# Patient Record
Sex: Female | Born: 1960 | Race: White | Hispanic: No | Marital: Married | Smoking: Never smoker
Health system: Southern US, Community
[De-identification: ages and names within clinical notes are randomized; demographics above are authoritative.]

## PROBLEM LIST (undated history)

## (undated) DIAGNOSIS — J069 Acute upper respiratory infection, unspecified: Secondary | ICD-10-CM

## (undated) DIAGNOSIS — R0600 Dyspnea, unspecified: Secondary | ICD-10-CM

## (undated) DIAGNOSIS — J4550 Severe persistent asthma, uncomplicated: Secondary | ICD-10-CM

## (undated) DIAGNOSIS — N840 Polyp of corpus uteri: Secondary | ICD-10-CM

## (undated) DIAGNOSIS — E782 Mixed hyperlipidemia: Secondary | ICD-10-CM

## (undated) DIAGNOSIS — D509 Iron deficiency anemia, unspecified: Secondary | ICD-10-CM

## (undated) DIAGNOSIS — J45909 Unspecified asthma, uncomplicated: Secondary | ICD-10-CM

## (undated) DIAGNOSIS — M2550 Pain in unspecified joint: Secondary | ICD-10-CM

## (undated) DIAGNOSIS — R945 Abnormal results of liver function studies: Secondary | ICD-10-CM

## (undated) DIAGNOSIS — J189 Pneumonia, unspecified organism: Secondary | ICD-10-CM

## (undated) DIAGNOSIS — R9389 Abnormal findings on diagnostic imaging of other specified body structures: Secondary | ICD-10-CM

## (undated) DIAGNOSIS — G4730 Sleep apnea, unspecified: Secondary | ICD-10-CM

## (undated) DIAGNOSIS — Z973 Presence of spectacles and contact lenses: Secondary | ICD-10-CM

## (undated) DIAGNOSIS — E739 Lactose intolerance, unspecified: Secondary | ICD-10-CM

## (undated) DIAGNOSIS — H532 Diplopia: Secondary | ICD-10-CM

## (undated) DIAGNOSIS — F329 Major depressive disorder, single episode, unspecified: Secondary | ICD-10-CM

## (undated) DIAGNOSIS — E559 Vitamin D deficiency, unspecified: Secondary | ICD-10-CM

## (undated) DIAGNOSIS — G4733 Obstructive sleep apnea (adult) (pediatric): Secondary | ICD-10-CM

## (undated) DIAGNOSIS — E539 Vitamin B deficiency, unspecified: Secondary | ICD-10-CM

## (undated) DIAGNOSIS — E538 Deficiency of other specified B group vitamins: Secondary | ICD-10-CM

## (undated) DIAGNOSIS — G7000 Myasthenia gravis without (acute) exacerbation: Secondary | ICD-10-CM

## (undated) DIAGNOSIS — M4800 Spinal stenosis, site unspecified: Secondary | ICD-10-CM

## (undated) DIAGNOSIS — K449 Diaphragmatic hernia without obstruction or gangrene: Secondary | ICD-10-CM

## (undated) DIAGNOSIS — R112 Nausea with vomiting, unspecified: Secondary | ICD-10-CM

## (undated) DIAGNOSIS — M48061 Spinal stenosis, lumbar region without neurogenic claudication: Secondary | ICD-10-CM

## (undated) DIAGNOSIS — I890 Lymphedema, not elsewhere classified: Secondary | ICD-10-CM

## (undated) DIAGNOSIS — K589 Irritable bowel syndrome without diarrhea: Secondary | ICD-10-CM

## (undated) DIAGNOSIS — C439 Malignant melanoma of skin, unspecified: Secondary | ICD-10-CM

## (undated) DIAGNOSIS — Z8719 Personal history of other diseases of the digestive system: Secondary | ICD-10-CM

## (undated) DIAGNOSIS — F32A Depression, unspecified: Secondary | ICD-10-CM

## (undated) DIAGNOSIS — K760 Fatty (change of) liver, not elsewhere classified: Secondary | ICD-10-CM

## (undated) DIAGNOSIS — Z87442 Personal history of urinary calculi: Secondary | ICD-10-CM

## (undated) DIAGNOSIS — R600 Localized edema: Secondary | ICD-10-CM

## (undated) DIAGNOSIS — M1990 Unspecified osteoarthritis, unspecified site: Secondary | ICD-10-CM

## (undated) DIAGNOSIS — K219 Gastro-esophageal reflux disease without esophagitis: Secondary | ICD-10-CM

## (undated) DIAGNOSIS — D649 Anemia, unspecified: Secondary | ICD-10-CM

## (undated) HISTORY — PX: SALPINGOOPHORECTOMY: SHX82

## (undated) HISTORY — DX: Abnormal results of liver function studies: R94.5

## (undated) HISTORY — DX: Dyspnea, unspecified: R06.00

## (undated) HISTORY — DX: Pain in unspecified joint: M25.50

## (undated) HISTORY — DX: Fatty (change of) liver, not elsewhere classified: K76.0

## (undated) HISTORY — DX: Depression, unspecified: F32.A

## (undated) HISTORY — DX: Lactose intolerance, unspecified: E73.9

## (undated) HISTORY — DX: Irritable bowel syndrome without diarrhea: K58.9

## (undated) HISTORY — PX: WRIST SURGERY: SHX841

## (undated) HISTORY — DX: Unspecified osteoarthritis, unspecified site: M19.90

## (undated) HISTORY — DX: Vitamin D deficiency, unspecified: E55.9

## (undated) HISTORY — DX: Iron deficiency anemia, unspecified: D50.9

## (undated) HISTORY — DX: Acute upper respiratory infection, unspecified: J06.9

## (undated) HISTORY — DX: Localized edema: R60.0

## (undated) HISTORY — DX: Anemia, unspecified: D64.9

## (undated) HISTORY — DX: Vitamin B deficiency, unspecified: E53.9

## (undated) HISTORY — DX: Major depressive disorder, single episode, unspecified: F32.9

## (undated) HISTORY — DX: Spinal stenosis, site unspecified: M48.00

## (undated) HISTORY — PX: APPENDECTOMY: SHX54

## (undated) HISTORY — PX: OOPHORECTOMY: SHX86

## (undated) HISTORY — DX: Malignant melanoma of skin, unspecified: C43.9

## (undated) HISTORY — PX: TOTAL KNEE ARTHROPLASTY: SHX125

## (undated) HISTORY — PX: BACK SURGERY: SHX140

## (undated) HISTORY — PX: MELANOMA EXCISION: SHX5266

---

## 1981-02-28 HISTORY — PX: APPENDECTOMY: SHX54

## 1984-02-29 HISTORY — PX: ORIF WRIST FRACTURE: SHX2133

## 1997-02-28 HISTORY — PX: EYE SURGERY: SHX253

## 1998-02-28 DIAGNOSIS — G7000 Myasthenia gravis without (acute) exacerbation: Secondary | ICD-10-CM

## 1998-02-28 HISTORY — DX: Myasthenia gravis without (acute) exacerbation: G70.00

## 1998-04-21 ENCOUNTER — Encounter: Admission: RE | Admit: 1998-04-21 | Discharge: 1998-07-20 | Payer: Self-pay

## 1998-05-30 HISTORY — PX: KNEE ARTHROSCOPY: SUR90

## 1998-09-22 ENCOUNTER — Encounter: Payer: Self-pay | Admitting: Gastroenterology

## 1998-09-22 ENCOUNTER — Ambulatory Visit (HOSPITAL_COMMUNITY): Admission: RE | Admit: 1998-09-22 | Discharge: 1998-09-22 | Payer: Self-pay | Admitting: Gastroenterology

## 1998-10-27 ENCOUNTER — Ambulatory Visit (HOSPITAL_BASED_OUTPATIENT_CLINIC_OR_DEPARTMENT_OTHER): Admission: RE | Admit: 1998-10-27 | Discharge: 1998-10-27 | Payer: Self-pay | Admitting: Orthopaedic Surgery

## 1998-11-03 ENCOUNTER — Ambulatory Visit (HOSPITAL_COMMUNITY): Admission: RE | Admit: 1998-11-03 | Discharge: 1998-11-03 | Payer: Self-pay | Admitting: Obstetrics and Gynecology

## 1999-02-19 ENCOUNTER — Other Ambulatory Visit: Admission: RE | Admit: 1999-02-19 | Discharge: 1999-02-19 | Payer: Self-pay | Admitting: Gastroenterology

## 1999-03-01 HISTORY — PX: THYMECTOMY: SHX6121

## 1999-03-01 HISTORY — PX: BREAST REDUCTION SURGERY: SHX8

## 1999-05-21 ENCOUNTER — Ambulatory Visit (HOSPITAL_COMMUNITY): Admission: RE | Admit: 1999-05-21 | Discharge: 1999-05-21 | Payer: Self-pay | Admitting: Gastroenterology

## 1999-05-21 ENCOUNTER — Encounter: Payer: Self-pay | Admitting: Gastroenterology

## 2000-03-14 ENCOUNTER — Other Ambulatory Visit: Admission: RE | Admit: 2000-03-14 | Discharge: 2000-03-14 | Payer: Self-pay | Admitting: Obstetrics and Gynecology

## 2000-05-16 ENCOUNTER — Ambulatory Visit (HOSPITAL_BASED_OUTPATIENT_CLINIC_OR_DEPARTMENT_OTHER): Admission: RE | Admit: 2000-05-16 | Discharge: 2000-05-17 | Payer: Self-pay | Admitting: Orthopaedic Surgery

## 2000-05-16 ENCOUNTER — Encounter: Payer: Self-pay | Admitting: Orthopaedic Surgery

## 2000-05-16 ENCOUNTER — Encounter: Admission: RE | Admit: 2000-05-16 | Discharge: 2000-05-16 | Payer: Self-pay | Admitting: Orthopaedic Surgery

## 2000-06-30 ENCOUNTER — Encounter: Payer: Self-pay | Admitting: Emergency Medicine

## 2000-06-30 ENCOUNTER — Emergency Department (HOSPITAL_COMMUNITY): Admission: EM | Admit: 2000-06-30 | Discharge: 2000-06-30 | Payer: Self-pay | Admitting: Emergency Medicine

## 2000-07-03 ENCOUNTER — Ambulatory Visit (HOSPITAL_COMMUNITY): Admission: RE | Admit: 2000-07-03 | Discharge: 2000-07-03 | Payer: Self-pay | Admitting: Emergency Medicine

## 2000-07-03 ENCOUNTER — Encounter: Payer: Self-pay | Admitting: Emergency Medicine

## 2000-11-11 ENCOUNTER — Encounter: Payer: Self-pay | Admitting: Emergency Medicine

## 2000-11-11 ENCOUNTER — Emergency Department (HOSPITAL_COMMUNITY): Admission: EM | Admit: 2000-11-11 | Discharge: 2000-11-11 | Payer: Self-pay | Admitting: Emergency Medicine

## 2001-03-13 ENCOUNTER — Encounter: Admission: RE | Admit: 2001-03-13 | Discharge: 2001-03-13 | Payer: Self-pay | Admitting: Internal Medicine

## 2001-03-13 ENCOUNTER — Encounter: Payer: Self-pay | Admitting: Internal Medicine

## 2001-07-03 ENCOUNTER — Emergency Department (HOSPITAL_COMMUNITY): Admission: AC | Admit: 2001-07-03 | Discharge: 2001-07-03 | Payer: Self-pay

## 2001-07-03 ENCOUNTER — Encounter: Payer: Self-pay | Admitting: Emergency Medicine

## 2001-07-27 ENCOUNTER — Other Ambulatory Visit: Admission: RE | Admit: 2001-07-27 | Discharge: 2001-07-27 | Payer: Self-pay | Admitting: Obstetrics and Gynecology

## 2001-10-03 ENCOUNTER — Inpatient Hospital Stay (HOSPITAL_COMMUNITY): Admission: AD | Admit: 2001-10-03 | Discharge: 2001-10-03 | Payer: Self-pay | Admitting: Obstetrics and Gynecology

## 2001-10-31 ENCOUNTER — Inpatient Hospital Stay (HOSPITAL_COMMUNITY): Admission: AD | Admit: 2001-10-31 | Discharge: 2001-10-31 | Payer: Self-pay | Admitting: Obstetrics and Gynecology

## 2001-11-26 ENCOUNTER — Ambulatory Visit (HOSPITAL_BASED_OUTPATIENT_CLINIC_OR_DEPARTMENT_OTHER): Admission: RE | Admit: 2001-11-26 | Discharge: 2001-11-26 | Payer: Self-pay | Admitting: Plastic Surgery

## 2001-11-26 ENCOUNTER — Encounter (INDEPENDENT_AMBULATORY_CARE_PROVIDER_SITE_OTHER): Payer: Self-pay | Admitting: Specialist

## 2002-01-17 ENCOUNTER — Encounter (INDEPENDENT_AMBULATORY_CARE_PROVIDER_SITE_OTHER): Payer: Self-pay | Admitting: *Deleted

## 2002-01-17 ENCOUNTER — Ambulatory Visit (HOSPITAL_COMMUNITY): Admission: RE | Admit: 2002-01-17 | Discharge: 2002-01-17 | Payer: Self-pay | Admitting: Obstetrics and Gynecology

## 2002-01-29 ENCOUNTER — Encounter: Admission: RE | Admit: 2002-01-29 | Discharge: 2002-03-01 | Payer: Self-pay | Admitting: Family Medicine

## 2002-02-28 HISTORY — PX: BREAST SURGERY: SHX581

## 2002-02-28 HISTORY — PX: BREAST REDUCTION SURGERY: SHX8

## 2002-02-28 HISTORY — PX: THYMECTOMY: SHX6121

## 2002-12-27 ENCOUNTER — Other Ambulatory Visit: Admission: RE | Admit: 2002-12-27 | Discharge: 2002-12-27 | Payer: Self-pay | Admitting: Obstetrics and Gynecology

## 2003-04-03 ENCOUNTER — Encounter: Admission: RE | Admit: 2003-04-03 | Discharge: 2003-04-03 | Payer: Self-pay | Admitting: Obstetrics and Gynecology

## 2003-08-04 ENCOUNTER — Emergency Department (HOSPITAL_COMMUNITY): Admission: EM | Admit: 2003-08-04 | Discharge: 2003-08-04 | Payer: Self-pay | Admitting: Emergency Medicine

## 2003-10-31 ENCOUNTER — Encounter: Payer: Self-pay | Admitting: Gastroenterology

## 2004-01-07 ENCOUNTER — Other Ambulatory Visit: Admission: RE | Admit: 2004-01-07 | Discharge: 2004-01-07 | Payer: Self-pay | Admitting: Obstetrics and Gynecology

## 2004-05-13 ENCOUNTER — Encounter (HOSPITAL_COMMUNITY): Admission: RE | Admit: 2004-05-13 | Discharge: 2004-08-11 | Payer: Self-pay | Admitting: *Deleted

## 2004-08-11 ENCOUNTER — Ambulatory Visit (HOSPITAL_COMMUNITY): Admission: RE | Admit: 2004-08-11 | Discharge: 2004-08-11 | Payer: Self-pay | Admitting: Plastic Surgery

## 2004-08-11 ENCOUNTER — Encounter (INDEPENDENT_AMBULATORY_CARE_PROVIDER_SITE_OTHER): Payer: Self-pay | Admitting: Plastic Surgery

## 2004-08-11 ENCOUNTER — Ambulatory Visit (HOSPITAL_BASED_OUTPATIENT_CLINIC_OR_DEPARTMENT_OTHER): Admission: RE | Admit: 2004-08-11 | Discharge: 2004-08-11 | Payer: Self-pay | Admitting: Plastic Surgery

## 2005-02-15 ENCOUNTER — Ambulatory Visit (HOSPITAL_BASED_OUTPATIENT_CLINIC_OR_DEPARTMENT_OTHER): Admission: RE | Admit: 2005-02-15 | Discharge: 2005-02-15 | Payer: Self-pay | Admitting: Orthopaedic Surgery

## 2005-02-15 ENCOUNTER — Ambulatory Visit (HOSPITAL_COMMUNITY): Admission: RE | Admit: 2005-02-15 | Discharge: 2005-02-15 | Payer: Self-pay | Admitting: Orthopaedic Surgery

## 2005-04-13 ENCOUNTER — Other Ambulatory Visit: Admission: RE | Admit: 2005-04-13 | Discharge: 2005-04-13 | Payer: Self-pay | Admitting: Obstetrics and Gynecology

## 2005-04-27 ENCOUNTER — Encounter: Admission: RE | Admit: 2005-04-27 | Discharge: 2005-04-27 | Payer: Self-pay | Admitting: Obstetrics and Gynecology

## 2006-07-25 ENCOUNTER — Inpatient Hospital Stay (HOSPITAL_COMMUNITY): Admission: EM | Admit: 2006-07-25 | Discharge: 2006-07-30 | Payer: Self-pay | Admitting: Emergency Medicine

## 2006-07-27 ENCOUNTER — Encounter (INDEPENDENT_AMBULATORY_CARE_PROVIDER_SITE_OTHER): Payer: Self-pay | Admitting: Gastroenterology

## 2006-08-17 ENCOUNTER — Emergency Department (HOSPITAL_COMMUNITY): Admission: EM | Admit: 2006-08-17 | Discharge: 2006-08-17 | Payer: Self-pay | Admitting: Family Medicine

## 2006-10-17 ENCOUNTER — Ambulatory Visit: Payer: Self-pay | Admitting: Gastroenterology

## 2006-12-29 ENCOUNTER — Encounter (HOSPITAL_COMMUNITY): Admission: RE | Admit: 2006-12-29 | Discharge: 2007-02-28 | Payer: Self-pay | Admitting: Neurology

## 2007-04-13 ENCOUNTER — Encounter (HOSPITAL_COMMUNITY): Admission: RE | Admit: 2007-04-13 | Discharge: 2007-07-12 | Payer: Self-pay | Admitting: Neurology

## 2007-06-04 DIAGNOSIS — G7000 Myasthenia gravis without (acute) exacerbation: Secondary | ICD-10-CM | POA: Insufficient documentation

## 2007-06-04 DIAGNOSIS — G2581 Restless legs syndrome: Secondary | ICD-10-CM | POA: Insufficient documentation

## 2007-06-04 DIAGNOSIS — K589 Irritable bowel syndrome without diarrhea: Secondary | ICD-10-CM | POA: Insufficient documentation

## 2007-06-04 DIAGNOSIS — E538 Deficiency of other specified B group vitamins: Secondary | ICD-10-CM | POA: Insufficient documentation

## 2007-06-04 DIAGNOSIS — K219 Gastro-esophageal reflux disease without esophagitis: Secondary | ICD-10-CM | POA: Insufficient documentation

## 2007-07-16 ENCOUNTER — Emergency Department (HOSPITAL_COMMUNITY): Admission: EM | Admit: 2007-07-16 | Discharge: 2007-07-16 | Payer: Self-pay | Admitting: Emergency Medicine

## 2008-03-25 ENCOUNTER — Ambulatory Visit: Payer: Self-pay | Admitting: Family Medicine

## 2008-03-25 DIAGNOSIS — R5383 Other fatigue: Secondary | ICD-10-CM | POA: Insufficient documentation

## 2008-03-25 DIAGNOSIS — J3089 Other allergic rhinitis: Secondary | ICD-10-CM | POA: Insufficient documentation

## 2008-03-25 DIAGNOSIS — J45909 Unspecified asthma, uncomplicated: Secondary | ICD-10-CM | POA: Insufficient documentation

## 2008-03-25 DIAGNOSIS — R5381 Other malaise: Secondary | ICD-10-CM | POA: Insufficient documentation

## 2008-03-25 DIAGNOSIS — D649 Anemia, unspecified: Secondary | ICD-10-CM | POA: Insufficient documentation

## 2008-03-27 LAB — CONVERTED CEMR LAB
ALT: 15 units/L (ref 0–35)
Albumin: 4 g/dL (ref 3.5–5.2)
Alkaline Phosphatase: 66 units/L (ref 39–117)
BUN: 11 mg/dL (ref 6–23)
Bilirubin, Direct: 0.1 mg/dL (ref 0.0–0.3)
CO2: 29 meq/L (ref 19–32)
Calcium: 9.2 mg/dL (ref 8.4–10.5)
Eosinophils Relative: 1.6 % (ref 0.0–5.0)
GFR calc Af Amer: 115 mL/min
Glucose, Bld: 95 mg/dL (ref 70–99)
HCT: 37.5 % (ref 36.0–46.0)
Hemoglobin: 13.1 g/dL (ref 12.0–15.0)
LDL Cholesterol: 111 mg/dL — ABNORMAL HIGH (ref 0–99)
Lymphocytes Relative: 26.7 % (ref 12.0–46.0)
Monocytes Absolute: 0.5 10*3/uL (ref 0.1–1.0)
Monocytes Relative: 7.6 % (ref 3.0–12.0)
Neutro Abs: 4.4 10*3/uL (ref 1.4–7.7)
Potassium: 4.1 meq/L (ref 3.5–5.1)
RDW: 11.9 % (ref 11.5–14.6)
Sodium: 140 meq/L (ref 135–145)
Total CHOL/HDL Ratio: 3.5
Total Protein: 7.4 g/dL (ref 6.0–8.3)
WBC: 6.8 10*3/uL (ref 4.5–10.5)

## 2008-03-31 ENCOUNTER — Telehealth: Payer: Self-pay | Admitting: Family Medicine

## 2008-04-03 ENCOUNTER — Encounter: Payer: Self-pay | Admitting: Family Medicine

## 2008-04-03 ENCOUNTER — Other Ambulatory Visit: Admission: RE | Admit: 2008-04-03 | Discharge: 2008-04-03 | Payer: Self-pay | Admitting: Family Medicine

## 2008-04-03 ENCOUNTER — Ambulatory Visit: Payer: Self-pay | Admitting: Family Medicine

## 2008-04-07 ENCOUNTER — Encounter (INDEPENDENT_AMBULATORY_CARE_PROVIDER_SITE_OTHER): Payer: Self-pay | Admitting: *Deleted

## 2008-04-10 ENCOUNTER — Encounter (INDEPENDENT_AMBULATORY_CARE_PROVIDER_SITE_OTHER): Payer: Self-pay | Admitting: *Deleted

## 2008-05-16 ENCOUNTER — Ambulatory Visit: Payer: Self-pay | Admitting: Cardiology

## 2008-05-16 ENCOUNTER — Ambulatory Visit: Payer: Self-pay | Admitting: Family Medicine

## 2008-05-16 DIAGNOSIS — R1084 Generalized abdominal pain: Secondary | ICD-10-CM | POA: Insufficient documentation

## 2008-05-16 LAB — CONVERTED CEMR LAB
AST: 20 units/L (ref 0–37)
Albumin: 3.8 g/dL (ref 3.5–5.2)
Alkaline Phosphatase: 72 units/L (ref 39–117)
BUN: 14 mg/dL (ref 6–23)
Basophils Absolute: 0 10*3/uL (ref 0.0–0.1)
Bilirubin, Direct: 0 mg/dL (ref 0.0–0.3)
CO2: 29 meq/L (ref 19–32)
Calcium: 9.3 mg/dL (ref 8.4–10.5)
Creatinine, Ser: 0.7 mg/dL (ref 0.4–1.2)
Eosinophils Absolute: 0.1 10*3/uL (ref 0.0–0.7)
GFR calc non Af Amer: 95.06 mL/min (ref >60)
Glucose, Bld: 95 mg/dL (ref 70–99)
Lymphocytes Relative: 21.6 % (ref 12.0–46.0)
MCHC: 35 g/dL (ref 30.0–36.0)
Monocytes Relative: 8.4 % (ref 3.0–12.0)
Neutro Abs: 4.5 10*3/uL (ref 1.4–7.7)
Neutrophils Relative %: 67.6 % (ref 43.0–77.0)
Platelets: 273 10*3/uL (ref 150.0–400.0)
RDW: 12.2 % (ref 11.5–14.6)
Sodium: 141 meq/L (ref 135–145)
Total Bilirubin: 0.7 mg/dL (ref 0.3–1.2)

## 2008-05-23 ENCOUNTER — Encounter (INDEPENDENT_AMBULATORY_CARE_PROVIDER_SITE_OTHER): Payer: Self-pay | Admitting: *Deleted

## 2008-05-23 LAB — CONVERTED CEMR LAB: GC Probe Amp, Genital: NEGATIVE

## 2008-06-04 ENCOUNTER — Ambulatory Visit: Payer: Self-pay | Admitting: Family Medicine

## 2008-06-04 DIAGNOSIS — M5412 Radiculopathy, cervical region: Secondary | ICD-10-CM | POA: Insufficient documentation

## 2008-06-04 DIAGNOSIS — M7700 Medial epicondylitis, unspecified elbow: Secondary | ICD-10-CM | POA: Insufficient documentation

## 2008-06-23 ENCOUNTER — Telehealth: Payer: Self-pay | Admitting: Family Medicine

## 2010-03-21 ENCOUNTER — Encounter: Payer: Self-pay | Admitting: Family Medicine

## 2010-03-22 ENCOUNTER — Encounter: Payer: Self-pay | Admitting: Family Medicine

## 2010-07-13 NOTE — Assessment & Plan Note (Signed)
[facility]                         GASTROENTEROLOGY OFFICE NOTE   [name], [name]                 MRN:          [id number]  DATE:10/17/2006                            DOB:          23-Apr-1960    [name] REQUESTING CONSULTATION:  Celso Amy, PA.   REASON FOR REFERRAL:  Abdominal pain and diarrhea.   HISTORY OF PRESENT ILLNESS:  [name] is a very nice 50 year old  white female who I know from [facility].  She has  previously been evaluated by Dr. Sheryn Bison.  See his note dated  October 06, 2003.  She underwent colonoscopy in September of 2005, and the  colonoscopy was normal.  Random biopsies were also normal.  IBD  serologies were negative, and she was presumed to have diarrhea-  predominant irritable bowel syndrome.  She states she has had on and  off, mild, loose, non-bloody stools over the years.  On Jul 25, 2006 she  had the acute onset of abdominal pain associated with bloody diarrhea,  and was hospitalized at [facility] for 6 days.  The records are  enclosed on the chart, and I have reviewed them including history and  physical exam, discharge summary, sigmoidoscopy with biopsies, and  imaging studies.  A CT angiogram showed no vascular abnormalities.  Her  sigmoidoscopy revealed descending colon colitis with biopsies showing  mucosal erosions with active colitis and reactive changes.  The  pathologist felt the changes favored ischemic colitis.  Her history and  physical exam notes a history of Crohn's disease; however, it does not  appear that she has had a diagnosis of Crohn's disease established.  After seeing Dr. Sheryn Bison, she was followed by Dr. Vida Rigger  over the past few years, and I do not have those records available for  me at the time of dictation.  A CT angiography did not show any large  vessel disease.  The patient notes that she had either an anal fissure  or anal fistula in  2000 that healed but did lead to some concerns  regarding Crohn's disease.  Since her hospitalization, she has continued  to have mild, right lower quadrant greater than left lower quadrant  pain, associated with gas and mild diarrhea.  She has noted a very small  amount of rectal bleeding on 1 or 2 occasions.  She has several family  members with colon cancer, including 2 maternal aunts, 1 paternal aunt.  No 1st degree relatives with colon cancer.  No relatives with ulcerative  colitis or Crohn's disease.   PAST MEDICAL HISTORY:  1. Myasthenia gravis, diagnosed in 1999.  2. Status post thymectomy in 2000.  3. Restless leg syndrome.  4. B12 deficiency.  5. Status post left oophorectomy.  6. Status post right elbow surgery.  7. Gastroesophageal reflux disease.  8. Diarrhea-predominant irritable bowel syndrome.  9. History of a corneal erosion.   CURRENT MEDICATIONS:  Listed on the chart, updated and reviewed.   MEDICATION ALLERGIES:  None known.   SOCIAL HISTORY/REVIEW OF SYSTEMS:  Per the handwritten form.   PHYSICAL EXAMINATION:  Overweight white female in no  acute distress.  Height 5 feet 7-1/2 inches, weight 213 pounds, blood pressure is 122/80,  pulse 88 and regular.  HEENT EXAM:  Anicteric sclerae, oropharynx clear.  CHEST:  Clear to auscultation bilaterally.  CARDIAC:  Regular rate and rhythm without murmurs appreciated.  ABDOMEN:  Soft with minimal right lower quadrant tenderness to deep  palpation, no rebound or guarding, no palpable organomegaly, masses, or  hernias.  Normoactive bowel sounds.  RECTAL EXAMINATION:  Deferred to time of colonoscopy.  NEUROLOGIC:  Alert and oriented x3.  Grossly nonfocal.   ASSESSMENT AND PLAN:  Mild diarrhea with small volume hematochezia and  mild right lower quadrant pain.  Recent hospitalization for presumed  ischemic colitis, etiology unclear.  Very questionable history of  Crohn's disease, with prior colonoscopy and IBD serologies  in 2005 were  entirely negative.  Recent imaging studies and Flex Sig did not show any  evidence of IBD. Schedule colonoscopy with attempted evaluation of the  terminal ileum and biopsies as indicated.  Risks, benefits, and  alternatives to colonoscopy, possible biopsy, and possible polypectomy  discussed with the patient and she consents to proceed.  This will be  scheduled electively.  Will begin Symax 1-2 sublingually q.4 h. p.r.n.  She may have a post inflammatory flare of diarrhea-predominant irritable  bowel syndrome.  Consider capsule endoscopy or CT enterography for  further evaluation of the small intestine.  Consider repeating IBD  serologies.     Venita Lick. Russella Dar, MD, [facility]  Electronically Signed    MTS/MedQ  DD: 10/17/2006  DT: 10/18/2006  Job #: [id number]   cc:   Celso Amy, [facility]

## 2010-07-13 NOTE — Consult Note (Signed)
NAMEMCCALL, WILL          ACCOUNT NO.:  [id number]   MEDICAL RECORD NO.:  [id number]          PATIENT TYPE:  INP   LOCATION:  3706                         FACILITY:  [facility]   PHYSICIAN:  Shirley Friar, MDDATE OF BIRTH:  02-15-1961   DATE OF CONSULTATION:  07/26/2006  DATE OF DISCHARGE:                                 CONSULTATION   REASON FOR CONSULTATION:  We were consulted this morning for bloody  diarrhea, possible small bowel obstruction.   HISTORY OF PRESENT ILLNESS:  This is a 50 year old female who awoke  suddenly early Tuesday morning with vomiting and black diarrhea.  The  vomiting has continued and is now dry heaves.  The black diarrhea has  changed to red blood.  The patient states that she had eight to 10 bowel  movements overnight that consisted of straight red blood.  She has  severe lower abdominal pain bilaterally.  No recent illness or sick  contacts.  She has positive acid reflux.  She has a recently diagnosed  vitamin B12 deficiency with positive antiparietal antibodies.  On EGD on  Jul 05, 2006 she had multiple gastric polyps, a hiatal hernia and a  benign-appearing stricture.  The EGD was otherwise normal.  Pathology of  the polyps was benign.  Negative for H pylori.  She states that her last  oral intake was on Monday of this week and she has had no previous  history of bloody diarrhea.  However, she does have diarrhea two to  three times per week normally and has for years.  She states that during  this period she will have five to six bowel movements a day and then it  subsides on its own.   PAST MEDICAL HISTORY:  1. Significant for a colonoscopy in 1998 by Dr. Vida Rigger that showed      proctitis, questionable irritable bowel disease.  2. She has a history of myasthenia gravis.  3. Restless leg syndrome.  4. Gastroesophageal reflux disease.   SURGERIES:  1. Thymectomy.  2. Right knee surgery.  3. Open laparoscopy for fibroids.  4.  Oophorectomy.  5. She has had a hysterectomy.   PRIMARY MEDICAL CARE Shriya Aker:  Celso Amy, physician assistant.   CURRENT MEDICATIONS AT HOME:  1. Mirapex.  2. Protonix.   ALLERGIES:  She has no known drug allergies.   REVIEW OF SYSTEMS:  Significant for no weight loss or recent illness.   FAMILY HISTORY:  She has a family history of her mother and sister who  have both had small bowel obstructions and her father who has a history  of ulcers.   SOCIAL HISTORY:  Positive for occasional alcohol.  Negative tobacco.  Negative drugs.  She lives with her husband.   PHYSICAL EXAMINATION:  GENERAL APPEARANCE:  She is alert and oriented in  obvious pain.  CARDIOVASCULAR:  Heart has a regular rate and rhythm.  LUNGS:  Clear to auscultation.  ABDOMEN:  Soft.  Tender in left upper quadrant as well as lower  quadrants bilaterally.  There is no guarding or rebound.  She has active  bowel sounds.  VITAL SIGNS:  Her temperature is 97.2, pulse 88, respirations are 20,  blood pressure is 110/69.   LABORATORY DATA:  Hemoglobin is currently 11, hematocrit 32.3, white  count 10.9, platelets 240,000.  Her Chem-7 is normal.  X-ray showed mild small bowel distention, possible mild ileus or SBO.   ASSESSMENT:  Dr. Charlott Rakes has seen and examined the patient.  He  states that this is a 50 year old female with questionable history of  irritable bowel disease, never diagnosed with Crohn's who is now  vomiting, having severe abdominal pain with bloody diarrhea.  Possible  ischemic colitis versus viral gastroenteritis.  Also a possible ileus  versus small bowel obstruction.  We will get a STAT CT of her abdomen  and pelvis with Gastrografin contrast, stool studies, Phenergan for  nausea and pain, morphine for pain and consult surgery pending results  of CT scan.   Thanks very much for this consultation.      Stephani Police, [place]      Shirley Friar, MD  Electronically  Signed    MLY/MEDQ  D:  07/26/2006  T:  07/26/2006  Job:  [id number]   cc:   Celso Amy, PA  Petra Kuba, M.D.  Shirley Friar, MD  Corinna L. Lendell Caprice, MD

## 2010-07-13 NOTE — H&P (Signed)
NAMENANETTE, WIRSING          ACCOUNT NO.:  [id number]   MEDICAL RECORD NO.:  [id number]          PATIENT TYPE:  INP   LOCATION:  3706                         FACILITY:  [facility]   PHYSICIAN:  Kela Millin, M.D.DATE OF BIRTH:  1961/02/10   DATE OF ADMISSION:  07/25/2006  DATE OF DISCHARGE:                              HISTORY & PHYSICAL   PRIMARY CARE PHYSICIAN:  Dr. Nicholos Johns.   CHIEF COMPLAINT:  Bloody diarrhea with abdominal pain.   HISTORY OF PRESENT ILLNESS:  The patient is a 50 year old white female  with past medical history significant for Crohn's disease, GERD, who  presents with above complaints.  She says that she was in her usual  state of health until about 2:00 a.m. on the morning of admission when  she was awakened by crampy abdominal pain and she began having diarrhea.  She states that initially her stools were black, but as she continued to  have more diarrhea, she noticed that she was having bright-red blood in  the stools.  She states that she has had about 10 bowel movements so  far.  She states that the abdominal pain has been continuous, diffuse,  but worse in her lower abdomen and more intense at times than others,  10/10 in intensity at its worse.  The patient has also had multiple  episodes of nausea, vomiting.  Denies hematemesis.  She denies fever,  dysuria, cough, chest pain and no shortness of breath.   The patient was seen in the ER and her hemoglobin was 12.7 with a  hematocrit of 37.2.  Her Hemoccult was positive and abdominal x-ray  showed nonspecific mild small bowel distention--mild adynamic ileus  versus early partial small bowel obstruction per radiologist.  The  patient reports that she is followed by Dr. Ewing Schlein and had an EGD done  about a week ago because she was found to have a low vitamin B12 level  by her primary care physician.  She is admitted to the [facility] hospitalist  service for further evaluation and management.   PAST MEDICAL  HISTORY:  1. As stated above.  2. Reported low vitamin B12 level.  3. History of myasthenia gravis--status post thymectomy.  4. History of corneal erosion.  5. History of restless leg syndrome.   MEDICATIONS:  1. Protonix 40 mg p.o. b.i.d.  2. Mirapex 0.25 mg 2 tablets p.o. t.i.d.   ALLERGIES:  NKDA.   SOCIAL HISTORY:  She denies tobacco, occasional alcohol.   FAMILY HISTORY:  Her dad had lung cancer.   REVIEW OF SYSTEMS:  As per HPI.  Other review of systems negative.   PHYSICAL EXAMINATION:  GENERAL:  The patient is a middle-aged white  female, retching initially upon entry into the room.  In no respiratory  distress.  VITAL SIGNS:  Her temperature is 97, blood pressure 133/70, pulse 96,  respiratory rate 22, O2 sat of 95%.  HEENT:  PERRL, EOMI.  Sclerae anicteric.  Slightly dry mucous membranes.  No oral exudates.  NECK:  Supple.  No adenopathy, no thyromegaly, no JVD.  LUNGS:  Clear to auscultation bilaterally.  No crackles.  No  wheezes.  CARDIOVASCULAR:  Regular rate and rhythm.  Normal S1, S2.  ABDOMEN:  Soft.  Bowel sounds present, diffusely tender.  No rebound  tenderness.  Nondistended and no organomegaly.  Bowel sounds present.  EXTREMITIES:  No cyanosis and no edema.   LABORATORY DATA:  Abdominal x-ray as above.  White cell count is 13.2  with hemoglobin of 12.7, hematocrit 37.2, platelet count 289, neutrophil  count 81%.  Sodium 139, potassium 3.7, chloride 104, CO2 28, glucose  105, BUN 16, creatinine 0.71, calcium 9.1, total protein 7.0, albumin is  3.9.  LFTs unremarkable.  Lipase is 18.  Urinalysis:  Specific gravity  1.023, urine pH 5.5, otherwise negative for infection.  Occult blood  positive.   ASSESSMENT/PLAN:  1. Bloody diarrhea with abdominal pain--as discussed above, we will      obtain stool studies, monitor H and H and type and screen.      Gastroenterology consulted and I discussed patient with Dr. Laural Benes      on call including her history of  Crohn's disease and he agrees with      above and states that he does not recommend empiric antibiotics or      steroids at this time.  Pain management, NPO, hydrate with IV      fluids.  2. History of Crohn's disease--as discussed above, on no medications.      Gastroenterology consulted as above.  3. Gastroesophageal reflux disease--continue proton pump inhibitor.  4. History of restless leg syndrome--continue Mirapex.  5. History of myasthenia gravis--status post thymectomy.      Kela Millin, M.D.  Electronically Signed     ACV/MEDQ  D:  07/26/2006  T:  07/26/2006  Job:  [id number]   cc:   Molly Maduro A. Nicholos Johns, M.D.

## 2010-07-13 NOTE — Op Note (Signed)
[name], [name]          ACCOUNT NO.:  [id number]   MEDICAL RECORD NO.:  [id number]          PATIENT TYPE:  INP   LOCATION:  3706                         FACILITY:  [facility]   PHYSICIAN:  Shirley Friar, MDDATE OF BIRTH:  03/12/1960   DATE OF PROCEDURE:  07/27/2006  DATE OF DISCHARGE:                               OPERATIVE REPORT   PROCEDURE:  Sigmoidoscopy.   INDICATION:  Rectal bleeding, abnormal CT scan showing thickening of the  descending colon.   MEDICATIONS:  Fentanyl 100 mcg IV, Versed 7 mg IV.   FINDINGS:  Rectal exam was normal.  A pediatric colonoscope was inserted  into unprepped colon and advanced to distal transverse colon.  On  insertion the rectum appeared normal with good vascular pattern.  The  sigmoid colon appeared normal with good vascular pattern.  The  descending colon had diffuse areas of white plaques with edema and  erythema concerning for pseudomembranous vs ischemic colitis.  Multiple  biopsies were taken of this area.  The distal transverse colon appeared  normal.  A biopsy were taken in the distal sigmoid and rectum as well.  Retroflexion was unremarkable.   ASSESSMENT:  1. The descending colon colitis with white plaques concerning for      pseudomembranous versus ischemic colitis, status post multiple      biopsies.  2. Rectal sparing status post biopsy.   PLAN:  1. Start IV Flagyl until tolerating p.o. medicines.  2. Follow-up on path to see if she has Clostridium difficile colitis.  3. Clear liquid diet as tolerated.      Shirley Friar, MD  Electronically Signed     VCS/MEDQ  D:  07/27/2006  T:  07/27/2006  Job:  [id number]   cc:   Petra Kuba, M.D.

## 2010-07-13 NOTE — Discharge Summary (Signed)
[name], [name]          ACCOUNT NO.:  [id number]   MEDICAL RECORD NO.:  [id number]          PATIENT TYPE:  INP   LOCATION:  5713                         FACILITY:  [facility]   PHYSICIAN:  Corinna L. Lendell Caprice, MDDATE OF BIRTH:  01/07/1961   DATE OF ADMISSION:  07/25/2006  DATE OF DISCHARGE:                               DISCHARGE SUMMARY   DISCHARGE DIAGNOSES:  1. Colitis.  2. Hematochezia.  3. Gastroesophageal reflux disease.  4. History of B12 deficiency.  5. History of myasthenia gravis, status post thymectomy.  6. History of restless leg syndrome.   DISCHARGE MEDICATIONS:  1. Dilaudid 2-4 mg p.o. q.4 h. p.r.n. pain, 15 were dispensed.  2. Reglan 10 mg p.o. q.6 h. p.r.n. nausea, 15 were dispensed.  3. She may continue Mirapex 0.25 mg 2 tablets t.i.d.  4. Protonix 40 mg p.o. b.i.d.   FOLLOW UP:  With Dr. Leary Roca.   ACTIVITY:  Ad lib.   DIET:  As tolerated.   CONDITION:  Stable.   CONSULTATIONS:  Dr. Charlott Rakes.   PROCEDURES:  Sigmoidoscopy on Jul 27, 2006, which showed white plaques  concerning for pseudomembranous colitis and rectal sparing, pathology  showed mucosal erosions associated with active colitis and reactive  changes, no granulomata or adenomatous epithelium identified, overall  pattern favoring ischemic colitis.   PERTINENT LABORATORIES:  White blood cell count on admission was 13,000,  otherwise normal CBC.  Hemoccult of the stool was positive. Her  hemoglobin on admission was 12.7 and with hydration decreased somewhat  but at discharge her hemoglobin was 12.2.  PT/PTT normal.  Basic  metabolic panel unremarkable.  Urinalysis negative.  Liver function  tests unremarkable.   SPECIAL STUDIES AND RADIOLOGY:  Acute abdominal series on admission  showed nonspecific mild small bowel distension, may represent mild  adynamic ileus or early partial small bowel obstruction.  CT of the  abdomen and pelvis with contrast showed a long segment of  descending  colitis, lack of significant atherosclerosis argues against ischemia,  not typical for Crohn's colitis given absent terminal or distal ileal  disease, fatty infiltration of the liver, 2 mm right lower lobe lung  nodule.  If the patient is a smoker chest CT followup in 1 year should  be considered.  She had serial abdominal x-ray.  CT angiogram on Jul 29, 2006, showed improvement of colitis and no vascular abnormalities.  EKG  showed normal sinus rhythm.   HISTORY AND HOSPITAL COURSE:  Please see H&P for complete admission  details.  The patient is a 50 year old white female who presented with  bloody diarrhea.  Initially there was a report that she had a past  medical history significant for Crohn's disease.  However, upon further  research, the patient never had biopsy-proven Crohn's disease.  She did  have a colonoscopy with Dr. Ewing Schlein several years ago which showed  nonspecific proctitis.  She had a questionable history of irritable  bowel disease according to Dr. Marge Duncans note.  She presented with  bloody diarrhea, vomiting and severe abdominal pain mainly in the left  lower quadrant.  The patient was admitted to the  floor where she was  made n.p.o.  She initially had an NG tube.  She was given empiric Cipro  and Flagyl.  Serial hemoglobins were done.  She was given Protonix, pain  medication and nausea medication.  GI was consulted.  Several stool  studies were ordered, including culture and C. Diff.  These were  unfortunately never collected, however.  Dr. Bosie Clos performed a flex  sig which showed white plaques that was concerning for pseudomembranous  colitis.  The pathology however was more consistent with ischemic  colitis.  The CT angiogram however showed no vascular abnormalities.  Nevertheless, the patient's pain and vomiting improved.  At the time of  discharge she was feeling much better and tolerating a regular diet.   The patient also requested her  vitamin B12 shot while here and she was  given 1000 mcg subcutaneously.      Corinna L. Lendell Caprice, MD  Electronically Signed     CLS/MEDQ  D:  07/30/2006  T:  07/30/2006  Job:  [id number]   cc:   Petra Kuba, M.D.  Shirley Friar, MD  Elana Alm. Nicholos Johns, M.D.

## 2010-07-16 NOTE — H&P (Signed)
NAME:  [name], [name]                    ACCOUNT NO.:  [id number]   MEDICAL RECORD NO.:  [id number]                   PATIENT TYPE:  AMB   LOCATION:  DAY                                  FACILITY:  [facility]   PHYSICIAN:  Juluis Mire, M.D.                DATE OF BIRTH:  Dec 15, 1960   DATE OF ADMISSION:  01/17/2002  DATE OF DISCHARGE:                                HISTORY & PHYSICAL   HISTORY OF PRESENT ILLNESS:  The patient is a 50 year old gravida 3, para 2,  abortus 1, married white female who presents for hysteroscopy along with  diagnostic laparoscopy __________ standby.   Relation to the present admission.  The patient did conceive in July of this  year.  Because of numerous medications she was on, had the pregnancy  terminated in early August.  Since that time has had problems with continued  abnormal bleeding and pelvic pain.  We have attempted to control this with  various agents including antibiotics without response.  Ultrasounds have  revealed what was initially thought to be a hemorrhagic cyst of the right  ovary.  However, this has persisted.  It seems to be an area of increased  density involving the right ovary measuring between 5 and 6 cm.  This could  be an endometrioma or other ovarian process.  She has had persistent pain  related to this and, therefore, presents now for the above-noted surgery.   ALLERGIES:  The patient has no known drug allergies.   MEDICATIONS:  Zanaflex.  Mirapex.  Topamax.  Effexor.  Imuran.  Mestinon.  Librax.  Neurontin.   PAST MEDICAL HISTORY:  1. The patient has been diagnosed with myasthenia gravis.  She had a     previous thymectomy and is on medications as noted.  She is followed at     [facility] for this.  2. She does have also a history of corneal erosions.  This is followed by     Dr. Delaney Meigs here in town.  3. Finally, a history of Crohn's disease, under management by Dr. Ewing Schlein.   PAST SURGICAL  HISTORY:  1. She had a previous diagnostic laparoscopy done for evaluation of pelvic     pain in 2000.  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  2. She is also noted to have had the previous thymectomy.  3. She has had a previous appendectomy in 1983.  4. Arm surgery in 1985.  5. History of breast biopsy in 1991.   OBSTETRICAL HISTORY:  She has had two spontaneous vaginal deliveries and one  TAB.   FAMILY HISTORY:  Father with a history of lung cancer.  Mother with a  history of asthma and emphysema.   SOCIAL HISTORY:  No tobacco or alcohol use.   REVIEW OF SYSTEMS:  Noncontributory.   PHYSICAL EXAMINATION:  VITAL SIGNS:  The patient is afebrile, stable vital  signs.  HEENT:  The patient is normocephalic.  Pupils equal, round, and reactive to  light and accommodation.  Extraocular movements were intact.  Sclerae and  conjunctivae clear.  Oropharynx clear.  NECK:  Without thyromegaly.  BREASTS:  No discrete masses.  LUNGS:  Clear.  CARDIAC:  Regular rate and rhythm.  Without murmurs or gallops.  ABDOMEN:  Benign.  No masses, organomegaly, or tenderness.  PELVIC:  Normal external genitalia.  Vaginal mucosa clear.  Cervix  unremarkable.  Uterus __________.  Normal size, shape, and contour.  Slight  right adnexal fullness.  Left adnexa unremarkable.  EXTREMITIES:  Trace edema.  NEUROLOGIC:  Grossly within normal limits.   IMPRESSION:  1. Persistent right adnexal mass associated with pelvic pain.  2. Abnormal bleeding.  3. Myasthenia gravis.   PLAN:  The patient will undergo open laparoscopy with evaluation of right  adnexa.  Possible right salpingo-oophorectomy will be undertaken.  She will  have a hysteroscopy also performed.  Risks of surgery have been discussed  including the risk of infection.  The risk of hemorrhage that would  necessitate transfusion with the risk of AIDS or hepatitis.  The risk of  injury to adjacent  organs including bladder, bowel that could require  further exploratory surgery, the risk of deep vein thrombosis and pulmonary  embolus.  The patient appears to understand.                                                Juluis Mire, M.D.    JSM/MEDQ  D:  01/17/2002  T:  01/17/2002  Job:  [id number]

## 2010-07-16 NOTE — Op Note (Signed)
   NAME:  [name], [name]                    ACCOUNT NO.:  [id number]   MEDICAL RECORD NO.:  [id number]                   PATIENT TYPE:  AMB   LOCATION:  DSC                                  FACILITY:  [facility]   PHYSICIAN:  Alfredia Ferguson, M.D.               DATE OF BIRTH:  06-06-1960   DATE OF PROCEDURE:  11/26/2001  DATE OF DISCHARGE:                                 OPERATIVE REPORT   PREOPERATIVE DIAGNOSES:  Slowly enlarging lesion, right cheek.   POSTOPERATIVE DIAGNOSES:  Slowly enlarging lesion, right cheek.   OPERATION PERFORMED:  Excision of dermal and subcutaneous lesion, right  cheek, excisional diameter 1.4 cm.   SURGEON:  Alfredia Ferguson, M.D.   ANESTHESIA:  2% Xylocaine 1:100,000 epinephrine.   INDICATIONS FOR PROCEDURE:  This is a 50 year old woman who has had a lesion  on her right cheek for many years.  It was excised in the past but recurred.  It is enlarging and she wishes to have it removed.  She understands the  risks of this surgery including unsightly scarring and recurrence.  In spite  of that, the patient wishes to proceed with the surgery.   DESCRIPTION OF PROCEDURE:  Skin markers were placed in elliptical fashion  with approximately 1 to 2 mm margins around the lesion.  Local anesthesia  was infiltrated and the area was prepped with Betadine and draped with  sterile drapes.  After waiting approximately 10 minutes, an elliptical  excision of the lesion down to the level of the subcutaneous tissue was  carried out.  The specimen was passed off for pathology.  Wound edges were  undermined for a distance of approximately 5 mm in all direction in all  directions.  Hemostasis was accomplished using pressure once again.  The  dermis was closed using multiple interrupted 5-0 Monocryl sutures.  The skin  was united using a running 6-0 nylon suture.  The patient tolerated the  procedure well with minimal blood loss.  A light dressing was applied and  the  patient was discharged to home in a satisfactory condition.                                                 Alfredia Ferguson, M.D.    WBB/MEDQ  D:  11/26/2001  T:  11/26/2001  Job:  [id number]

## 2010-07-16 NOTE — Op Note (Signed)
[name], [name]          ACCOUNT NO.:  [id number]   MEDICAL RECORD NO.:  [id number]          PATIENT TYPE:  AMB   LOCATION:  DSC                          FACILITY:  [facility]   PHYSICIAN:  Lubertha Basque. Dalldorf, M.D.DATE OF BIRTH:  Jan 27, 1961   DATE OF PROCEDURE:  02/15/2005  DATE OF DISCHARGE:  02/15/2005                                 OPERATIVE REPORT   PREOPERATIVE DIAGNOSIS:  1.  Right knee torn medial meniscus.  2.  Right knee degenerative joint disease.   POSTOPERATIVE DIAGNOSES:  1.  Right knee torn medial meniscus.  2.  Right knee degenerative joint disease.   PROCEDURES:  1.  Right knee partial medial meniscectomy.  2.  Right knee removal of loose bodies.  3.  Abrasion arthroplasty, medial tibial plateau.   ANESTHESIA:  Knee block MAC.   ATTENDING SURGEON:  Lubertha Basque. Jerl Santos, M.D.   ASSISTANT:  Lindwood Qua, P.A.   INDICATIONS FOR PROCEDURE:  The patient is a 50 year old woman who has  persisted with pain and effusion about her right knee, despite oral anti-  inflammatories and aspiration and injection x2. She has also  tried some  physical therapy and a brace. By MRI scan, this shows a torn medial meniscus  and some degenerative changes. She has pain which limits her ability to rest  and remain active, and she is offered an arthroscopy. Informed operative  consent was obtained, after discussion of the possible complications of:  reaction to anesthesia and infection.   DESCRIPTION OF PROCEDURE:  The patient was taken to the operating suite,  where knee block and MAC were applied. She was positioned supine; prepped  and draped in normal sterile fashion. After the administration of  preoperative IV Ancef, an arthroscopy of the right knee was performed  through two inferior portals. The suprapatellar pouch was benign, except for  an abundance of small cartilaginous loose bodies which were removed. The  patellofemoral joint had minimal degenerative change and  tracked in a  slightly lateral position. In the medial compartment she had some loose  bodies which consisted of articular cartilage, and these appeared to emanate  from the tibial plateau. She had about a one- half dime-size area near the  middle horn of medial meniscus on the tibial plateau, which was devoid of  cartilage and had some sharp margins consistent with a traumatic injury.  These were smoothed back with a shaver. I removed several loose bodies in  this compartment, and another fairly large one from the lateral compartment;  2 of these measured more than 5 mm in width. I then used the microfracture  set; I made 3 small holes on the tibia. I decreased the pump pressure, and  these did bleed fairly well. I performed a chondroplasty across the femur.  She had a small posterior horn medial meniscus tear, as seen on the MRI  scan. This was addressed with a partial medial meniscectomy, taking just a  very small portion of the structure back to a stable rim. The ACL appeared  intact. The lateral compartment was benign, except for the loose bodies  mentioned earlier. The knee  was thoroughly irrigated, followed by placement  of some Marcaine. Adaptic was placed over the portals, followed by dry gauze  and a loose Ace wrap. Estimated blood loss and intraoperative fluids can be  obtained from anesthesia records.   DISPOSITION:  The patient was taken to recovery room in stable addition.  Plans were for her to go home the same-day, and to follow up in the office  in one week. I will contact her by phone tonight.      Lubertha Basque Jerl Santos, M.D.  Electronically Signed     PGD/MEDQ  D:  02/15/2005  T:  02/17/2005  Job:  [id number]

## 2010-07-16 NOTE — Op Note (Signed)
NAME:  [name], [name]                    ACCOUNT NO.:  [id number]   MEDICAL RECORD NO.:  [id number]                   PATIENT TYPE:  AMB   LOCATION:  DAY                                  FACILITY:  [facility]   PHYSICIAN:  Juluis Mire, M.D.                DATE OF BIRTH:  December 24, 1960   DATE OF PROCEDURE:  01/17/2002  DATE OF DISCHARGE:                                 OPERATIVE REPORT   PREOPERATIVE DIAGNOSES:  1. Abnormal uterine bleeding.  2. Right adnexal mass.   POSTOPERATIVE DIAGNOSES:  1. Submucosal intrauterine fibroid.  2. Cystic teratoma of the right ovary.   OPERATIVE PROCEDURES:  1. Hysteroscopy.  2. Resection of submucosal fibroid.  3. Multiple endometrial biopsies.  4. Endometrial curettings.  5. Open laparoscopy.  6. Right salpingo-oophorectomy.   SURGEON:  Juluis Mire, M.D.   ANESTHESIA:  General endotracheal.   ESTIMATED BLOOD LOSS:  Minimal.   PACKS AND DRAINS:  None.   INTRAOPERATIVE BLOOD:  None.   COMPLICATIONS:  None.   INDICATIONS FOR PROCEDURE:  Dictated in history and physical.   PROCEDURE AS FOLLOWS:  The patient taken to the OR, placed in supine  position.  After satisfactory level of  general endotracheal anesthesia was  obtained, the patient was placed in the dorsal lithotomy position using the  Allen stirrups.  The abdomen, perineum, and vagina were prepped out with  Betadine.  The patient was then draped out for hysteroscopy.  A speculum was  placed in the vaginal vault.  The cervix was grasped with a single-tooth  tenaculum.  The uterus sounded to 8 cm.  Cervix serially dilated to a size  35 Pratt dilator.  The operative hysteroscope was introduced.  The  intrauterine cavity was distended using sorbitol.  A posterior wall  submucosal fibroid was noted.  This was resected in multiple pieces and sent  for pathological review.  The whole fibroid was indeed resected.  She did  have some shaggy endometrium.  Multiple endometrial  biopsies were taken and  sent for pathological review.  There were no signs of perforation or  complications.  The hysteroscope was then removed.  Endometrial curettings  were also obtained.  The Hulka tenaculum was put in place.  The single-tooth  tenaculum and speculum then removed.  The bladder was emptied by in-and-out  catheterization.   The patient's legs were repositioned.  A subumbilical incision made with a  knife and carried to the subcutaneous tissue.  The fascia was identified and  entered sharply, and the incision extended laterally.  Two lateral sutures  of 0 Vicryl were put at the edge of the fascia and held.  The peritoneum was  identified and entered sharply.  There was no evidence of injury to adjacent  organs.  The Hasson cannula was put into the incision and secured with the  held sutures.  The abdomen was insufflated with carbon dioxide.  There was  no evidence of injury to adjacent organs as the laparoscope was inserted.  A  5-mm trocar was put in place in the suprapubic area under direct  visualization.  The uterus was elevated.  The left tube and ovary  unremarkable.  The right tube was unremarkable.  She had a enlargement of  the right ovary measuring approximately 6 cm.  Initially, this was thought  to be an endometrioma.  There was no active endometriosis in the pelvis, no  adhesion process.  She did have some paracecal adhesions from previous  appendectomy.  The upper abdomen including the liver and the tip of the  gallbladder were clear.   Next, a third trocar was put into place in the left lower quadrant for  visualization of the epigastric vessels.  The ovarian mass was grasped with  forceps and we made an incision in the capsule.  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  The decision at this point in time was to  proceed with right salpingo-oophorectomy.  It was a relatively large dermoid  and the concern was spilling this sebaceous  material into the abdomen and  causing a chemical peritonitis.  The Gyrus tripolar was then brought into  place.  The ovarian vasculature was cauterized and incised.  The process was  continued up into the utero-ovarian pedicle which was subsequently  cauterized and incised.  The ovary was placed in an Endopouch and eventually  removed through the subumbilical incision.  We placed the trocar back in  place.  We thoroughly irrigated the pelvis.  We had good hemostasis along  the area where the right ovary was removed.  We recauterized this area.  The  ureter was easily identified and noted to be out of the operative field.  There was no active bleeding.  The abdomen was deflated of its carbon  dioxide.  All trocars removed.  The subumbilical fascia closed with tow  figure-of-eights of 0 Vicryl, skin with a running subcuticular of 4-0  Vicryl.  The suprapubic incision was closed with Steri-Strips.  The Hulka  tenaculum was then removed.  The patient taken out of the dorsal lithotomy  position.  Once alert and extubated, transferred to the recovery room in  good condition.  Sponge, instrument, and needle counts reported as correct  per circulating nurse x2.                                               Juluis Mire, M.D.    JSM/MEDQ  D:  01/17/2002  T:  01/17/2002  Job:  [id number]

## 2010-07-16 NOTE — Op Note (Signed)
[place]  Patient:    [name], [name]                 MRN: [id number] Proc. Date: 05/16/00 Adm. Date:  [id number] Attending:  Marcene Corning                           Operative Report  PREOPERATIVE DIAGNOSIS:  Right knee patellofemoral instability.  POSTOPERATIVE DIAGNOSIS:  Right knee patellofemoral instability.  OPERATION PERFORMED: 1. Right knee arthroscopic chondroplasty. 2. Right knee open Fulkerson slide procedure.  ANESTHESIA:  General.  ATTENDING SURGEON:  Lubertha Basque. Jerl Santos, M.D.  ASSISTANT:  Lindwood Qua, P.A.  INDICATIONS FOR PROCEDURE:  The patient is a 50 year old woman with a long history of patellofemoral instability.  This has persisted despite physical therapy, bracing, and an arthroscopic lateral release procedure.  She continues to have her knee cap dislocate and her knee buckle.  At this point she is offered an extensor realignment to consist of a Fulkerson slide along with arthroscopic procedures.  The procedures were discussed with the patient and informed operative consent was obtained after discussion of possible complications of reaction to anesthesia, infection, knee stiffness and deep vein thrombosis.  DESCRIPTION OF PROCEDURE:  The patient was taken to an operating suite where general anesthetic was applied without difficulty.  She was then positioned supine and prepped and draped in normal sterile fashion.  After administration of preop intravenous antibiotics, an arthroscopy of her right knee was performed through the three old portals.  She had some significant chondromalacia patella with far lateral patellar tracking.  Medial and lateral compartments were benign and the anterior cruciate ligament and posterior cruciate ligament were intact.  A chondroplasty was again performed of some loose cartilage on the patella.  There was no exposed bone.  Her lateral release was revised once again through  her old third portal.  At this point the leg was elevated, exsanguinated, and a tourniquet inflated about the thigh.  An anteromedial incision was made from the tibial tubercle distal. Dissection was carried down to the insertion point of the patellar tendon. The lateral release was then extended from this incision up to the arthroscopic lateral release thereby freeing the entire extensor mechanism in order to allow it to move in a medial direction.  An oscillating saw was used to make a cut on the tibial tubercle which started at the attachment site of the patellar tendon and went distally about two inches.  Care was taken to leave the distal portion intact including the periosteum.  This cut was angled in a way so as translation in a medial direction would also move the tibial tubercle in a slight anterior position.  An osteotome was used to complete the cut and the insertion site was taken medial slightly more than a centimeter. It was then secured at this point with two small fragments of  partial threaded cancellous screws with washer on each.  Fluoroscopy was used to confirm capture of the posterior cortex.  Excellent purchase was achieved on one end, moderate purchase was achieved on the other.  The scope was then introduced in the knee once again and the patella was found to track in a perfect position in the intercondylar notch with the knee flexed about 60 or 70 degrees.  The knee and the joint and the incision were thoroughly irrigated with sterile solution followed by release of the tourniquet.  The leg became pink  and warm immediately.  2-0 and 0 Vicryl were used to reapproximate the subcutaneous tissues about the incision site followed by nylon.  Adaptic was placed over the wounds and portals followed by dry gauze and loose Ace wrap. Estimated blood loss and intraoperative fluids can be obtained from Anesthesia records as can accurate tourniquet time.  DISPOSITION:  The  patient was extubated in the operating room and taken to the recovery room in stable condition.  Plans were for her to stay overnight for pain control with probable discharged home in the morning. DD:  05/16/00 TD:  05/16/00 Job: [id number]

## 2012-07-09 ENCOUNTER — Encounter (HOSPITAL_BASED_OUTPATIENT_CLINIC_OR_DEPARTMENT_OTHER): Payer: Self-pay

## 2012-07-09 ENCOUNTER — Emergency Department (HOSPITAL_BASED_OUTPATIENT_CLINIC_OR_DEPARTMENT_OTHER)
Admission: EM | Admit: 2012-07-09 | Discharge: 2012-07-09 | Disposition: A | Discharge status: Home / Self Care | Payer: Managed Care, Other (non HMO) | Attending: Emergency Medicine | Admitting: Emergency Medicine

## 2012-07-09 DIAGNOSIS — R5383 Other fatigue: Secondary | ICD-10-CM | POA: Insufficient documentation

## 2012-07-09 DIAGNOSIS — R5381 Other malaise: Secondary | ICD-10-CM | POA: Insufficient documentation

## 2012-07-09 DIAGNOSIS — G7000 Myasthenia gravis without (acute) exacerbation: Secondary | ICD-10-CM

## 2012-07-09 DIAGNOSIS — G43909 Migraine, unspecified, not intractable, without status migrainosus: Secondary | ICD-10-CM | POA: Insufficient documentation

## 2012-07-09 DIAGNOSIS — J45901 Unspecified asthma with (acute) exacerbation: Secondary | ICD-10-CM | POA: Insufficient documentation

## 2012-07-09 DIAGNOSIS — Z79899 Other long term (current) drug therapy: Secondary | ICD-10-CM | POA: Insufficient documentation

## 2012-07-09 HISTORY — DX: Unspecified asthma, uncomplicated: J45.909

## 2012-07-09 HISTORY — DX: Myasthenia gravis without (acute) exacerbation: G70.00

## 2012-07-09 LAB — CBC WITH DIFFERENTIAL/PLATELET
Basophils Absolute: 0 10*3/uL (ref 0.0–0.1)
Lymphocytes Relative: 26 % (ref 12–46)
Neutro Abs: 4.1 10*3/uL (ref 1.7–7.7)
Platelets: 270 10*3/uL (ref 150–400)
RBC: 4.32 MIL/uL (ref 3.87–5.11)
RDW: 13.3 % (ref 11.5–15.5)
WBC: 7 10*3/uL (ref 4.0–10.5)

## 2012-07-09 LAB — BASIC METABOLIC PANEL
CO2: 29 mEq/L (ref 19–32)
Chloride: 101 mEq/L (ref 96–112)
Potassium: 3.8 mEq/L (ref 3.5–5.1)
Sodium: 141 mEq/L (ref 135–145)

## 2012-07-09 MED ORDER — CYCLOBENZAPRINE HCL 10 MG PO TABS: 10.0000 mg | ORAL_TABLET | Freq: Two times a day (BID) | ORAL | Status: DC | PRN

## 2012-07-09 MED ORDER — SODIUM CHLORIDE 0.9 % IV BOLUS (SEPSIS)
1000.0000 mL | Freq: Once | INTRAVENOUS | Status: AC
  Administered 2012-07-09: 1000 mL via INTRAVENOUS

## 2012-07-09 MED ORDER — PYRIDOSTIGMINE BROMIDE 60 MG PO TABS: 60.0000 mg | ORAL_TABLET | Freq: Two times a day (BID) | ORAL | Status: DC

## 2012-07-09 MED ORDER — MORPHINE SULFATE 4 MG/ML IJ SOLN
4.0000 mg | Freq: Once | INTRAMUSCULAR | Status: AC
  Administered 2012-07-09: 4 mg via INTRAVENOUS
  Filled 2012-07-09: qty 1

## 2012-07-09 NOTE — ED Notes (Addendum)
Pt states she has been having a HA/Migraine for the past 5 days that radiates from the neck up. Pt states she has been experiencing numbness and tingling in both hands along with vision changes, "its been a little blurry at times." Pt states HA sometimes occur due to her Myasthenia gravis.

## 2012-07-09 NOTE — ED Provider Notes (Signed)
History     CSN: [id number]  Arrival date & time 07/09/12  1130   None     Chief Complaint  Patient presents with  . Migraine    (Consider location/radiation/quality/duration/timing/severity/associated sxs/prior treatment) HPI Comments: Patient is a 52 year old female with history of myasthenia gravis who presents today with a migraine. She states the headache is a sharp pain that radiates from her eyes the top of her head down her back of her neck and into her shoulders. It is there constantly. Has had worsening over the past 5 days. She has tried using heat and massage in the back of her neck without relief. She has had symptoms like this before when she has been having issues with her myasthenia gravis. She does not take any medications currently for her myasthenia gravis because she did not like the way they made her feel and she wanted to preserve her quality of life. She is currently on a trip from [place], [place]. She states she follows with a neurologist there. No fevers, chills, nausea, vomiting, abdominal pain.   The history is provided by the patient. No language interpreter was used.    Past Medical History  Diagnosis Date  . Asthma   . Myasthenia gravis     Past Surgical History  Procedure Laterality Date  . Appendectomy    . Thymectomy    . Wrist surgery Left     No family history on file.  History  Substance Use Topics  . Smoking status: Never Smoker   . Smokeless tobacco: Not on file  . Alcohol Use: Yes     Comment: occassionally    OB History   Grav Para Term Preterm Abortions TAB SAB Ect Mult Living                  Review of Systems  Constitutional: Negative for fever and chills.  Respiratory: Positive for shortness of breath.   Cardiovascular: Negative for chest pain.  Gastrointestinal: Negative for nausea, vomiting and abdominal pain.  Neurological: Positive for weakness and headaches.  All other systems reviewed and are  negative.    Allergies  Review of patient's allergies indicates not on file.  Home Medications   Current Outpatient Rx  Name  Route  Sig  Dispense  Refill  . Multiple Vitamins-Minerals (ADEKS) chewable tablet   Oral   Chew 1 tablet by mouth daily.         Marland Kitchen oxyCODONE-acetaminophen (PERCOCET) 10-325 MG per tablet   Oral   Take 1 tablet by mouth 3 (three) times daily as needed for pain.         . pantoprazole (PROTONIX) 20 MG tablet   Oral   Take 20 mg by mouth 2 (two) times daily.         . pramipexole (MIRAPEX) 0.5 MG tablet   Oral   Take 0.5 mg by mouth 3 (three) times daily.         Marland Kitchen tiZANidine (ZANAFLEX) 2 MG tablet   Oral   Take 2 mg by mouth at bedtime as needed.           BP 156/97  Pulse 113  Temp(Src) 98 F (36.7 C) (Oral)  Resp 18  Wt 200 lb (90.719 kg)  BMI 31.58 kg/m2  SpO2 97%  Physical Exam  Nursing note and vitals reviewed. Constitutional: She is oriented to person, place, and time. She appears well-developed and well-nourished. No distress.  HENT:  Head: Normocephalic and atraumatic.  Right Ear: External ear normal.  Left Ear: External ear normal.  Nose: Nose normal.  Mouth/Throat: Oropharynx is clear and moist.  Eyes: Conjunctivae are normal.  Neck: Normal range of motion.  Cardiovascular: Normal rate, regular rhythm and normal heart sounds.   Pulmonary/Chest: Effort normal and breath sounds normal. No stridor. No respiratory distress. She has no wheezes. She has no rales.  Abdominal: Soft. She exhibits no distension.  Musculoskeletal: Normal range of motion.  Neurological: She is alert and oriented to person, place, and time. She has normal strength. She displays no atrophy. No sensory deficit. She exhibits normal muscle tone. Coordination normal.  Skin: Skin is warm and dry. She is not diaphoretic. No erythema.  Psychiatric: She has a normal mood and affect. Her behavior is normal.    ED Course  Procedures (including critical  care time)  Labs Reviewed  CBC WITH DIFFERENTIAL - Abnormal; Notable for the following:    Eosinophils Relative 6 (*)    All other components within normal limits  BASIC METABOLIC PANEL - Abnormal; Notable for the following:    Glucose, Bld 101 (*)    All other components within normal limits   No results found.   1. Myasthenia gravis       MDM  Patient presents today with a headache which she relates to her myasthenia gravis. She is not on any medications for her myasthenia. She admits to shortness of breath, but on exam is not in respiratory distress. O2 saturation at 97% on room air. Neurology was consulted. They recommend half a tab of Mestinon BID after breakfast and lunch. She must follow up with a neurologist within one week. Dr. Judd Lien evaluated patient and agrees with plan. Patient / Family / Caregiver informed of clinical course, understand medical decision-making process, and agree with plan.       Mora Bellman, PA-C 07/09/12 2039

## 2012-07-10 NOTE — ED Provider Notes (Signed)
Medical screening examination/treatment/procedure(s) were conducted as a shared visit with non-physician practitioner(s) and myself.  I personally evaluated the patient during the encounter.  The patient presents with complaints of headache, stiffness in her back, and weakness that she feels is due to a flare of her Myastenia Gravis.  She is followed by a neurologist in [facility] where she is from who has taken her off of her medications and treats the flareups as they come.    On exam, the vitals are stable and she is afebrile.  The respiratory status is stable with good air movement.  Neurologically, the strength is symmetrical and there are no focal deficits.  Abdomen is benign.  The labs are unremarkable.  I have spoken with Dr. Thad Ranger from Neurology who had concerns about this patient not being on any medication.  Her recommendations were for the patient to be started on mestinon and follow up with her neurologist.  She was given the contact information for the neurology office in [place] and instructed to follow up in one week if she won't be returning to [facility] in the next week.  Geoffery Lyons, MD 07/10/12 1229

## 2012-07-11 ENCOUNTER — Ambulatory Visit (INDEPENDENT_AMBULATORY_CARE_PROVIDER_SITE_OTHER): Payer: Managed Care, Other (non HMO) | Admitting: Family Medicine

## 2012-07-11 ENCOUNTER — Emergency Department (HOSPITAL_BASED_OUTPATIENT_CLINIC_OR_DEPARTMENT_OTHER): Payer: Managed Care, Other (non HMO)

## 2012-07-11 ENCOUNTER — Encounter (HOSPITAL_BASED_OUTPATIENT_CLINIC_OR_DEPARTMENT_OTHER): Payer: Self-pay | Admitting: *Deleted

## 2012-07-11 ENCOUNTER — Emergency Department (HOSPITAL_BASED_OUTPATIENT_CLINIC_OR_DEPARTMENT_OTHER)
Admission: EM | Admit: 2012-07-11 | Discharge: 2012-07-11 | Disposition: A | Discharge status: Home / Self Care | Payer: Managed Care, Other (non HMO) | Attending: Emergency Medicine | Admitting: Emergency Medicine

## 2012-07-11 VITALS — BP 124/86 | HR 123 | Temp 97.9°F | Resp 16 | Ht 66.5 in | Wt 228.0 lb

## 2012-07-11 DIAGNOSIS — G44209 Tension-type headache, unspecified, not intractable: Secondary | ICD-10-CM

## 2012-07-11 DIAGNOSIS — R112 Nausea with vomiting, unspecified: Secondary | ICD-10-CM | POA: Insufficient documentation

## 2012-07-11 DIAGNOSIS — Z79899 Other long term (current) drug therapy: Secondary | ICD-10-CM | POA: Insufficient documentation

## 2012-07-11 DIAGNOSIS — R51 Headache: Secondary | ICD-10-CM

## 2012-07-11 DIAGNOSIS — IMO0001 Reserved for inherently not codable concepts without codable children: Secondary | ICD-10-CM | POA: Insufficient documentation

## 2012-07-11 DIAGNOSIS — M129 Arthropathy, unspecified: Secondary | ICD-10-CM | POA: Insufficient documentation

## 2012-07-11 DIAGNOSIS — G7000 Myasthenia gravis without (acute) exacerbation: Secondary | ICD-10-CM

## 2012-07-11 DIAGNOSIS — G43909 Migraine, unspecified, not intractable, without status migrainosus: Secondary | ICD-10-CM | POA: Insufficient documentation

## 2012-07-11 DIAGNOSIS — J45909 Unspecified asthma, uncomplicated: Secondary | ICD-10-CM | POA: Insufficient documentation

## 2012-07-11 DIAGNOSIS — Z862 Personal history of diseases of the blood and blood-forming organs and certain disorders involving the immune mechanism: Secondary | ICD-10-CM | POA: Insufficient documentation

## 2012-07-11 MED ORDER — PROMETHAZINE HCL 25 MG/ML IJ SOLN
50.0000 mg | Freq: Four times a day (QID) | INTRAMUSCULAR | Status: DC | PRN
  Administered 2012-07-11: 50 mg via INTRAMUSCULAR

## 2012-07-11 MED ORDER — DEXAMETHASONE SODIUM PHOSPHATE 10 MG/ML IJ SOLN: 10.0000 mg | Freq: Once | INTRAMUSCULAR | Status: DC

## 2012-07-11 MED ORDER — DIPHENHYDRAMINE HCL 50 MG/ML IJ SOLN
25.0000 mg | Freq: Once | INTRAMUSCULAR | Status: AC
  Administered 2012-07-11: 25 mg via INTRAVENOUS
  Filled 2012-07-11: qty 1

## 2012-07-11 MED ORDER — TIZANIDINE HCL 2 MG PO TABS: 2.0000 mg | ORAL_TABLET | Freq: Every evening | ORAL | Status: DC | PRN

## 2012-07-11 MED ORDER — OXYCODONE-ACETAMINOPHEN 5-325 MG PO TABS: 2.0000 | ORAL_TABLET | ORAL | Status: DC | PRN

## 2012-07-11 MED ORDER — PROMETHAZINE HCL 25 MG PO TABS: ORAL_TABLET | ORAL | Status: DC

## 2012-07-11 MED ORDER — SODIUM CHLORIDE 0.9 % IV BOLUS (SEPSIS)
1000.0000 mL | Freq: Once | INTRAVENOUS | Status: AC
  Administered 2012-07-11: 1000 mL via INTRAVENOUS

## 2012-07-11 MED ORDER — METOCLOPRAMIDE HCL 5 MG/ML IJ SOLN
10.0000 mg | Freq: Once | INTRAMUSCULAR | Status: AC
  Administered 2012-07-11: 10 mg via INTRAVENOUS
  Filled 2012-07-11: qty 2

## 2012-07-11 NOTE — Progress Notes (Signed)
Is a 52 year old woman is visiting from [place] and is developed a global headache over the last 7 days which is gotten to be 10 over 10 in severity and associated with nausea. She's never had a headache quite like this she has had headaches in the past. She says her head is sensitive to palpation. No focal weakness. Patient says that her shoulders are tight and sore as well particularly on the left side the Past medical history: Myasthenia gravis   Objective: No acute distress, alert and appropriate HEENT: Unremarkable Cranial nerves III through XII intact, moving all 4 extremities equally Neck: Supple no adenopathy Chest: Clear Heart: Regular no murmur Skin: No rash noted  Assessment: Muscle contraction headache  Plan: Since patient's pain has actually gotten worse and some Phenergan injection, sent her to the emergency department for further evaluation and treatment. Muscle contraction headache - Plan: promethazine (PHENERGAN) injection 50 mg, tiZANidine (ZANAFLEX) 2 MG tablet, promethazine (PHENERGAN) 25 MG tablet   Signed, Elvina Sidle and

## 2012-07-11 NOTE — ED Notes (Signed)
Pt sent here from [facility] for " migraine" x 7 days

## 2012-07-11 NOTE — ED Provider Notes (Signed)
History     CSN: [id number]  Arrival date & time 07/11/12  1447   First MD Initiated Contact with Patient 07/11/12 1506      Chief Complaint  Patient presents with  . Migraine    (Consider location/radiation/quality/duration/timing/severity/associated sxs/prior treatment) HPI Comments: Patient presents with a headache. She states it started 7 days ago and has gradually gotten worse. She states it started across her shoulders and she's had a lot of muscle tension across both of her shoulders. She states it subsequently spread throughout her head. She denies any pain along her spine. She denies any fevers or chills. She has a history of myasthenia gravis but she denies any symptoms of her myasthenia gravis. She states that she normally has shortness of breath or blurred vision with her myasthenia gravis but has not had any of these symptoms. She was seen here yesterday for a headache and she had told the provider that her headache was similar to myasthenia gravis-type headache. She however denies this now. She states that she did have a headache like this when she first was diagnosed with myasthenia gravis and he started her on high doses of prednisone. However she did not typically have these headaches with flareups of the myasthenia gravis. She denies any weakness in her arms or legs. She denies aged chest pain or shortness of breath. She denies any vision changes. She denies any recent falls or head trauma. She states she feels like she has terrible muscle tension across her shoulders and has tried massage without relief. She's also tried Flexeril and Zanaflex without relief. She's had 2 episodes of vomiting base a headache this morning.  Patient is a 52 y.o. female presenting with migraines.  Migraine Associated symptoms include headaches. Pertinent negatives include no chest pain, no abdominal pain and no shortness of breath.    Past Medical History  Diagnosis Date  . Asthma   .  Myasthenia gravis   . Anemia   . Arthritis     Past Surgical History  Procedure Laterality Date  . Appendectomy    . Thymectomy    . Wrist surgery Left     Family History  Problem Relation Age of Onset  . COPD Mother   . Cancer Father     History  Substance Use Topics  . Smoking status: Never Smoker   . Smokeless tobacco: Not on file  . Alcohol Use: 0.5 oz/week    1 drink(s) per week     Comment: occassionally    OB History   Grav Para Term Preterm Abortions TAB SAB Ect Mult Living                  Review of Systems  Constitutional: Negative for fever, chills, diaphoresis and fatigue.  HENT: Negative for congestion, rhinorrhea and sneezing.   Eyes: Negative.   Respiratory: Negative for cough, chest tightness and shortness of breath.   Cardiovascular: Negative for chest pain and leg swelling.  Gastrointestinal: Positive for nausea and vomiting. Negative for abdominal pain, diarrhea and blood in stool.  Genitourinary: Negative for frequency, hematuria, flank pain and difficulty urinating.  Musculoskeletal: Positive for myalgias. Negative for back pain and arthralgias.  Skin: Negative for rash.  Neurological: Positive for headaches. Negative for dizziness, speech difficulty, weakness and numbness.    Allergies  Review of patient's allergies indicates no known allergies.  Home Medications   Current Outpatient Rx  Name  Route  Sig  Dispense  Refill  . cyclobenzaprine (FLEXERIL)  10 MG tablet   Oral   Take 1 tablet (10 mg total) by mouth 2 (two) times daily as needed for muscle spasms.   20 tablet   0   . Multiple Vitamins-Minerals (ADEKS) chewable tablet   Oral   Chew 1 tablet by mouth daily.         Marland Kitchen omeprazole (PRILOSEC) 10 MG capsule   Oral   Take 10 mg by mouth daily.         Marland Kitchen oxyCODONE-acetaminophen (PERCOCET) 5-325 MG per tablet   Oral   Take 2 tablets by mouth every 4 (four) hours as needed for pain.   15 tablet   0   . pantoprazole  (PROTONIX) 20 MG tablet   Oral   Take 20 mg by mouth 2 (two) times daily.         . pramipexole (MIRAPEX) 0.5 MG tablet   Oral   Take 0.5 mg by mouth 3 (three) times daily.         . promethazine (PHENERGAN) 25 MG tablet      One every 6 hours for nausea or headache   20 tablet   0   . pyridostigmine (MESTINON) 60 MG tablet   Oral   Take 1 tablet (60 mg total) by mouth 2 (two) times daily. TAKE 1/2 TAB BID AFTER BREAKFAST AND LUNCH   30 tablet   0   . tiZANidine (ZANAFLEX) 2 MG tablet   Oral   Take 1 tablet (2 mg total) by mouth at bedtime as needed.   30 tablet   0     BP 139/92  Pulse 100  Temp(Src) 98.7 F (37.1 C)  Resp 16  Ht 5\' 8"  (1.727 m)  Wt 200 lb (90.719 kg)  BMI 30.42 kg/m2  SpO2 100%  Physical Exam  Constitutional: She is oriented to person, place, and time. She appears well-developed and well-nourished.  HENT:  Head: Normocephalic and atraumatic.  Eyes: Pupils are equal, round, and reactive to light.  Neck: Normal range of motion. Neck supple.  No neck stiffness  Cardiovascular: Normal rate, regular rhythm and normal heart sounds.   Pulmonary/Chest: Effort normal and breath sounds normal. No respiratory distress. She has no wheezes. She has no rales. She exhibits no tenderness.  Abdominal: Soft. Bowel sounds are normal. There is no tenderness. There is no rebound and no guarding.  Musculoskeletal: Normal range of motion. She exhibits no edema.  Tenderness across the trapezius muscles bilaterally  Lymphadenopathy:    She has no cervical adenopathy.  Neurological: She is alert and oriented to person, place, and time. She has normal strength. No cranial nerve deficit or sensory deficit. GCS eye subscore is 4. GCS verbal subscore is 5. GCS motor subscore is 6.  Skin: Skin is warm and dry. No rash noted.  Psychiatric: She has a normal mood and affect.    ED Course  Procedures (including critical care time)  Labs Reviewed - No data to  display Ct Head Wo Contrast  07/11/2012   *RADIOLOGY REPORT*  Clinical Data: Headache for 6 days.  Nausea vomiting.  CT HEAD WITHOUT CONTRAST  Technique:  Contiguous axial images were obtained from the base of the skull through the vertex without contrast.  Comparison: Report of the brain MR 07/04/2010.  Findings: Bone windows demonstrate clear paranasal sinuses and mastoid air cells.  Soft tissue windows demonstrate no  mass lesion, hemorrhage, hydrocephalus, acute infarct, intra-axial, or extra-axial fluid collection.  IMPRESSION: Normal head CT.  Original Report Authenticated By: Jeronimo Greaves, M.D.     1. Headache       MDM  Patient was given IV fluids along with Reglan and Benadryl. She states her headache is much better after that. She did not have any symptoms suggestive of a myasthenia gravis flare up. She was however started back on Mestinon yesterday which she has been taking. She doesn't have any pain along her spine fevers or other suggestions of subarachnoid hemorrhage or meningitis. She was discharged in good condition. I did give her prescription for a few Percocet for pain. I advised to return here if her headache is not improving or has any worsening symptoms.        Rolan Bucco, MD 07/11/12 [phone]

## 2012-07-27 ENCOUNTER — Other Ambulatory Visit: Payer: Self-pay | Admitting: Infectious Diseases

## 2012-07-27 ENCOUNTER — Ambulatory Visit
Admission: RE | Admit: 2012-07-27 | Discharge: 2012-07-27 | Disposition: A | Discharge status: Home / Self Care | Payer: No Typology Code available for payment source | Source: Ambulatory Visit | Attending: Infectious Diseases | Admitting: Infectious Diseases

## 2012-07-27 DIAGNOSIS — R7611 Nonspecific reaction to tuberculin skin test without active tuberculosis: Secondary | ICD-10-CM

## 2013-02-11 ENCOUNTER — Other Ambulatory Visit: Payer: Self-pay | Admitting: Family Medicine

## 2013-02-11 DIAGNOSIS — N926 Irregular menstruation, unspecified: Secondary | ICD-10-CM

## 2013-02-12 ENCOUNTER — Ambulatory Visit
Admission: RE | Admit: 2013-02-12 | Discharge: 2013-02-12 | Disposition: A | Discharge status: Home / Self Care | Payer: BC Managed Care – PPO | Source: Ambulatory Visit | Attending: Family Medicine | Admitting: Family Medicine

## 2013-02-12 DIAGNOSIS — N926 Irregular menstruation, unspecified: Secondary | ICD-10-CM

## 2013-02-22 ENCOUNTER — Emergency Department (HOSPITAL_COMMUNITY): Payer: BC Managed Care – PPO

## 2013-02-22 ENCOUNTER — Encounter (HOSPITAL_COMMUNITY): Payer: Self-pay | Admitting: Emergency Medicine

## 2013-02-22 ENCOUNTER — Emergency Department (HOSPITAL_COMMUNITY)
Admission: EM | Admit: 2013-02-22 | Discharge: 2013-02-22 | Disposition: A | Discharge status: Home / Self Care | Payer: BC Managed Care – PPO | Attending: Emergency Medicine | Admitting: Emergency Medicine

## 2013-02-22 DIAGNOSIS — Z791 Long term (current) use of non-steroidal anti-inflammatories (NSAID): Secondary | ICD-10-CM | POA: Insufficient documentation

## 2013-02-22 DIAGNOSIS — R0602 Shortness of breath: Secondary | ICD-10-CM | POA: Insufficient documentation

## 2013-02-22 DIAGNOSIS — M129 Arthropathy, unspecified: Secondary | ICD-10-CM | POA: Insufficient documentation

## 2013-02-22 DIAGNOSIS — M7989 Other specified soft tissue disorders: Secondary | ICD-10-CM | POA: Insufficient documentation

## 2013-02-22 DIAGNOSIS — R110 Nausea: Secondary | ICD-10-CM | POA: Insufficient documentation

## 2013-02-22 DIAGNOSIS — Z79899 Other long term (current) drug therapy: Secondary | ICD-10-CM | POA: Insufficient documentation

## 2013-02-22 DIAGNOSIS — G7000 Myasthenia gravis without (acute) exacerbation: Secondary | ICD-10-CM | POA: Insufficient documentation

## 2013-02-22 DIAGNOSIS — M5431 Sciatica, right side: Secondary | ICD-10-CM

## 2013-02-22 DIAGNOSIS — R079 Chest pain, unspecified: Secondary | ICD-10-CM | POA: Insufficient documentation

## 2013-02-22 DIAGNOSIS — M5430 Sciatica, unspecified side: Secondary | ICD-10-CM | POA: Insufficient documentation

## 2013-02-22 LAB — BASIC METABOLIC PANEL
BUN: 20 mg/dL (ref 6–23)
CO2: 27 mEq/L (ref 19–32)
Calcium: 9.5 mg/dL (ref 8.4–10.5)
Chloride: 99 mEq/L (ref 96–112)
GFR calc Af Amer: >90 mL/min (ref >90)
GFR calc non Af Amer: >90 mL/min (ref >90)
Potassium: 3.7 mEq/L (ref 3.5–5.1)

## 2013-02-22 LAB — URINALYSIS, ROUTINE W REFLEX MICROSCOPIC
Bilirubin Urine: NEGATIVE
Glucose, UA: NEGATIVE mg/dL
Hgb urine dipstick: NEGATIVE
Ketones, ur: NEGATIVE mg/dL
Leukocytes, UA: NEGATIVE
Nitrite: NEGATIVE
Specific Gravity, Urine: 1.013 (ref 1.005–1.030)
Urobilinogen, UA: 0.2 mg/dL (ref 0.0–1.0)
pH: 5.5 (ref 5.0–8.0)

## 2013-02-22 LAB — POCT I-STAT TROPONIN I

## 2013-02-22 LAB — CBC
Hemoglobin: 13 g/dL (ref 12.0–15.0)
MCHC: 33.9 g/dL (ref 30.0–36.0)
Platelets: 251 10*3/uL (ref 150–400)
RDW: 13.2 % (ref 11.5–15.5)
WBC: 6.6 10*3/uL (ref 4.0–10.5)

## 2013-02-22 MED ORDER — MORPHINE SULFATE 4 MG/ML IJ SOLN
4.0000 mg | Freq: Once | INTRAMUSCULAR | Status: AC
  Administered 2013-02-22: 4 mg via INTRAVENOUS
  Filled 2013-02-22: qty 1

## 2013-02-22 MED ORDER — KETOROLAC TROMETHAMINE 30 MG/ML IJ SOLN
30.0000 mg | Freq: Once | INTRAMUSCULAR | Status: AC
  Administered 2013-02-22: 30 mg via INTRAVENOUS
  Filled 2013-02-22: qty 1

## 2013-02-22 MED ORDER — HYDROMORPHONE HCL PF 1 MG/ML IJ SOLN
1.0000 mg | Freq: Once | INTRAMUSCULAR | Status: AC
  Administered 2013-02-22: 1 mg via INTRAVENOUS
  Filled 2013-02-22: qty 1

## 2013-02-22 MED ORDER — ASPIRIN 325 MG PO TABS
325.0000 mg | ORAL_TABLET | Freq: Once | ORAL | Status: AC
  Administered 2013-02-22: 325 mg via ORAL

## 2013-02-22 MED ORDER — KETOROLAC TROMETHAMINE 10 MG PO TABS: 10.0000 mg | ORAL_TABLET | Freq: Four times a day (QID) | ORAL | Status: DC | PRN

## 2013-02-22 MED ORDER — OXYCODONE-ACETAMINOPHEN 5-325 MG PO TABS: 2.0000 | ORAL_TABLET | ORAL | Status: DC | PRN

## 2013-02-22 MED ORDER — DIAZEPAM 5 MG PO TABS
5.0000 mg | ORAL_TABLET | Freq: Once | ORAL | Status: AC
  Administered 2013-02-22: 5 mg via ORAL
  Filled 2013-02-22: qty 1

## 2013-02-22 MED ORDER — PREDNISONE 20 MG PO TABS: ORAL_TABLET | ORAL | Status: DC

## 2013-02-22 MED ORDER — NITROGLYCERIN 0.4 MG SL SUBL
0.4000 mg | SUBLINGUAL_TABLET | SUBLINGUAL | Status: DC | PRN
  Administered 2013-02-22: 0.4 mg via SUBLINGUAL

## 2013-02-22 NOTE — Progress Notes (Signed)
   CARE MANAGEMENT ED NOTE 02/22/2013  Patient:  [name], [name]   Account Number:  [id number]  Date Initiated:  02/22/2013  Documentation initiated by:  Edd Arbour  Subjective/Objective Assessment:   52 yr old female [facility] [place] p[facility]     Subjective/Objective Assessment Detail:   pcp elizabeth dewey     Action/Plan:   EPIC updated   Action/Plan Detail:   Anticipated DC Date:  02/22/2013     Status Recommendation to Physician:   Result of Recommendation:    Other ED Services  Consult Working Plan    DC Planning Services  Other  PCP issues    Choice offered to / List presented to:            Status of service:  Completed, signed off  ED Comments:   ED Comments Detail:

## 2013-02-22 NOTE — ED Notes (Signed)
Patient transported to MRI 

## 2013-02-22 NOTE — Progress Notes (Signed)
Pt. Gave best effort for NIF & VC.  Best out of three tries NIF was -65; best out of three tries for VC was 2.3L Pt. Did express "cramp-like" feeling in her lungs during the procedure.  HR 71, RR 16, pox =94% on RA, BS= clear/diminished.

## 2013-02-22 NOTE — ED Notes (Signed)
C/o back pain since yesterday, denies injury, pain in lower part of back, also states feels as if elephant sitting on chest, denies SOB at present

## 2013-02-22 NOTE — ED Notes (Signed)
Patient transported to room from Xray

## 2013-02-22 NOTE — ED Notes (Signed)
Patient transported to X-ray 

## 2013-02-22 NOTE — ED Provider Notes (Signed)
Patient complains of dull, aching back pain, radiating to her right leg and foot onset yesterday, no trauma. She reports no relief w/pain medicine. She states bilateral foot paraesthesias for the past couple of months. Prior to onset of back pain 2 days ago had 1 episode of urinary incontinence in which she woke up and realized she wet the bed. This has never happened to her before.  She also c/o constant dull, aching chest pain, onset yesterday. Nothing makes her CP better or worse. She reports diffuse abdominal tenderness. Denies urinary sx, no difficulty voiding, no hematuria.   Physical Exam: Cardiovascular: Normal/strong palpable DP and PT pulses bilaterally. Skin normal, good color throughout. Brisk capillary refills to her toes Neurological: Decreased strength dorsi flexion of bilateral feet. +SLR on right foot. Decreased rectal tone, chaperone present.  Musculoskeletal: Tenderness to palpation lumbar paraspinal muscles   On reevaluation pt report pain is still persistent not improving.  Also mentioned the she did had an episode of urinary incontinence 2 days ago when she wet her bed, this has never happened before.  She also has positive straight leg raise and decrease dorsiflexion on exam bilaterally.  No saddle paresthesia. Has decreased rectal tone on exam.   Due to persistent pain, recent urinary incontinence and decreased rectal tone, will obtain MRI to r/o spinal cord compression.  Suspect caudal equina vs. sciatica.    Pt currently without cp or sob.  A negative Inspiratory Force and Vital Capacity test was performed which shows NIF -65 and VC of 2.3L  Progress Notes by Ellin Mayhew, RRT at 02/22/2013 4:38 PM    Author: Ellin Mayhew, RRT Service: Other Author Type: Respiratory Therapist   Filed: 02/22/2013 4:46 PM Note Time: 02/22/2013 4:38 PM Status: Signed   Editor: Ellin Mayhew, RRT (Respiratory Therapist)      Pt. Gave best effort for NIF & VC. Best out of three  tries NIF was -65; best out of three tries for VC was 2.3L  Pt. Did express "cramp-like" feeling in her lungs during the procedure. HR 71, RR 16, pox =94% on RA, BS= clear/diminished.        9:44 PM MRI of lumbar with Rightward disc herniation at L3-4.  Also has leftward disc herniation at L4-L5.  No significant stenosis.  I discussed finding with pt, and offer outpt treatment vs inpt treatment for pain control.  Pt prefers to be treated at home with toradol.  Will continue to monitor and d/c pt once able to ambulate.     11:30 PM Pt able to ambulate with assistance from her husband.  Her pain has not resolved but she prefers to go home.  I made pt aware that her medication may cause stomach irritation and she is aware.  Pt is having sciatica sxs.  She's aware to return if her pain is not well control.  Neurosurgeon referral as needed.  Care discussed with attending who agrees with plan.    BP 138/72  Pulse 95  Temp(Src) 98.2 F (36.8 C) (Oral)  Resp 20  SpO2 100%  I have reviewed nursing notes and vital signs. I personally reviewed the imaging tests through PACS system  I reviewed available ER/hospitalization records thought the EMR  Results for orders placed during the hospital encounter of 02/22/13  CBC      Result Value Range   WBC 6.6  4.0 - 10.5 K/uL   RBC 4.38  3.87 - 5.11 MIL/uL   Hemoglobin 13.0  12.0 -  15.0 g/dL   HCT 45.4  09.8 - 11.9 %   MCV 87.7  78.0 - 100.0 fL   MCH 29.7  26.0 - 34.0 pg   MCHC 33.9  30.0 - 36.0 g/dL   RDW 14.7  82.9 - 56.2 %   Platelets 251  150 - 400 K/uL  BASIC METABOLIC PANEL      Result Value Range   Sodium 138  135 - 145 mEq/L   Potassium 3.7  3.5 - 5.1 mEq/L   Chloride 99  96 - 112 mEq/L   CO2 27  19 - 32 mEq/L   Glucose, Bld 97  70 - 99 mg/dL   BUN 20  6 - 23 mg/dL   Creatinine, Ser 1.30  0.50 - 1.10 mg/dL   Calcium 9.5  8.4 - 86.5 mg/dL   GFR calc non Af Amer >90  >90 mL/min   GFR calc Af Amer >90  >90 mL/min  URINALYSIS,  ROUTINE W REFLEX MICROSCOPIC      Result Value Range   Color, Urine YELLOW  YELLOW   APPearance CLOUDY (*) CLEAR   Specific Gravity, Urine 1.013  1.005 - 1.030   pH 5.5  5.0 - 8.0   Glucose, UA NEGATIVE  NEGATIVE mg/dL   Hgb urine dipstick NEGATIVE  NEGATIVE   Bilirubin Urine NEGATIVE  NEGATIVE   Ketones, ur NEGATIVE  NEGATIVE mg/dL   Protein, ur NEGATIVE  NEGATIVE mg/dL   Urobilinogen, UA 0.2  0.0 - 1.0 mg/dL   Nitrite NEGATIVE  NEGATIVE   Leukocytes, UA NEGATIVE  NEGATIVE  POCT I-STAT TROPONIN I      Result Value Range   Troponin i, poc 0.00  0.00 - 0.08 ng/mL   Comment 3            Dg Chest 2 View  02/22/2013   CLINICAL DATA:  Pain  EXAM: CHEST  2 VIEW  COMPARISON:  None.  FINDINGS: There is no edema or consolidation. Heart size and pulmonary vascularity are normal. No adenopathy. No pneumothorax. No bone lesions. Patient is status post median sternotomy.  IMPRESSION: No edema or consolidation.   Electronically Signed   By: Bretta Bang M.D.   On: 02/22/2013 13:42   Dg Lumbar Spine Complete  02/22/2013   CLINICAL DATA:  Two-day history of low back pain.  EXAM: LUMBAR SPINE - COMPLETE 4+ VIEW  COMPARISON:  None.  FINDINGS: No fracture. No subluxation. There is mild loss of disc height at L3-4 and L4-5. The facets are well aligned bilaterally. SI joints are normal. Phleboliths are seen over the anatomic pelvis bilaterally.  IMPRESSION: Mild degenerative changes in lower lumbar discs. Otherwise unremarkable study.   Electronically Signed   By: Kennith Center M.D.   On: 02/22/2013 13:44   Mr Lumbar Spine Wo Contrast  02/22/2013   CLINICAL DATA:  Right lower extremity pain. Bilateral foot paresthesias.  EXAM: MRI LUMBAR SPINE WITHOUT CONTRAST  TECHNIQUE: Multiplanar, multisequence MR imaging was performed. No intravenous contrast was administered.  COMPARISON:  LUMBAR SPINE RADIOGRAPHS 02/22/2013.  FINDINGS: Normal signal is present in the conus medullaris which terminates at T12-L1.  Rightward curvature of the lumbar spine is centered at L3-4. Marrow signal and vertebral body heights are maintained. Grade 1 retrolisthesis at L3-4 measures 6 mm. Alignment is otherwise anatomic.  The disc levels at L2-3 and above are normal.  L3-4: A rightward disc protrusion is uncovered by the retrolisthesis. Mild lateral recess narrowing is worse on the right. There  is some annular tearing on the right. The foramina are patent bilaterally.  L4-5: A leftward disc herniation is present with a probable annular tear. Mild left lateral recess narrowing is present. There is mild left foraminal stenosis is well. Advanced facet hypertrophy is present bilaterally. A posterior synovial cyst on the right extends into the paraspinous musculature.  L5-S1: Mild left-sided facet hypertrophy is present. There is no significant stenosis.  IMPRESSION: 1. 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 2. Mild lateral recess narrowing bilaterally at L3-4 is worse on the right. 3. Leftward disc herniation and annular tear with mild left lateral recess and foraminal stenosis. 4. Mild left-sided facet hypertrophy at L5-S1 without significant stenosis.   Electronically Signed   By: Gennette Pac M.D.   On: 02/22/2013 21:23      Fayrene Helper, PA-C 02/22/13 2332  Fayrene Helper, PA-C 02/22/13 [phone]

## 2013-02-22 NOTE — ED Provider Notes (Signed)
CSN: [id number]     Arrival date & time 02/22/13  1229 History   First MD Initiated Contact with Patient 02/22/13 1249     Chief Complaint  Patient presents with  . Back Pain  . Chest Pain   (Consider location/radiation/quality/duration/timing/severity/associated sxs/prior Treatment) HPI Comments: Patient is a 52 year old female with a past medical history of arthritis and myasthenia gravis who presents to the emergency department complaining of chest pain x2 days. Pain located midsternal, constant and feels as if she has an elephant sitting in the Center of her chest. No specific aggravating or alleviating factors. She tried taking Tylenol without relief. She had a similar episode of chest pain 2 weeks back that lasted a few days and subsided on its own. Admits to associated shortness of breath, especially when the pain becomes more severe. Currently she is not short of breath. she was slightly nauseated yesterday, no longer nauseated today. Pain does not radiate. Also complaining of low back pain beginning yesterday, no known injury or trauma. Pain radiates across her lower back. She feels as if her chest and back pain are two separate issues. States occasionally she will wake up with swollen hands, lower extremities occasionally becomes swollen. Denies pain, numbness or tingling down her extremities, no loss of control of bowels or bladder or saddle anesthesia, fever cough. Denies ever having pain like this in the past. Denies history of high blood pressure, high cholesterol or diabetes. No family history of early heart disease. She is a nonsmoker.  Patient is a 52 y.o. female presenting with back pain and chest pain. The history is provided by the patient.  Back Pain Associated symptoms: chest pain   Chest Pain Associated symptoms: back pain, nausea and shortness of breath     Past Medical History  Diagnosis Date  . Myasthenia gravis   . Arthritis    Past Surgical History  Procedure  Laterality Date  . Appendectomy     History reviewed. No pertinent family history. History  Substance Use Topics  . Smoking status: Never Smoker   . Smokeless tobacco: Not on file  . Alcohol Use: Yes     Comment: occ   OB History   Grav Para Term Preterm Abortions TAB SAB Ect Mult Living                 Review of Systems  Respiratory: Positive for shortness of breath.   Cardiovascular: Positive for chest pain and leg swelling.  Gastrointestinal: Positive for nausea.  Musculoskeletal: Positive for back pain.  All other systems reviewed and are negative.    Allergies  Review of patient's allergies indicates no known allergies.  Home Medications   Current Outpatient Rx  Name  Route  Sig  Dispense  Refill  . HYDROcodone-acetaminophen (NORCO/VICODIN) 5-325 MG per tablet   Oral   Take 1 tablet by mouth 2 (two) times daily.         . naproxen sodium (ANAPROX) 220 MG tablet   Oral   Take 440 mg by mouth 2 (two) times daily with a meal.         . omeprazole (PRILOSEC) 20 MG capsule   Oral   Take 20 mg by mouth 2 (two) times daily before a meal.         . pramipexole (MIRAPEX) 1 MG tablet   Oral   Take 1 mg by mouth 3 (three) times daily.         Marland Kitchen tiZANidine (ZANAFLEX) 4 MG  capsule   Oral   Take 4 mg by mouth daily.          BP 127/80  Pulse 89  Temp(Src) 98.2 F (36.8 C) (Oral)  Resp 20  SpO2 97% Physical Exam  Nursing note and vitals reviewed. Constitutional: She is oriented to person, place, and time. She appears well-developed and well-nourished. No distress.  HENT:  Head: Normocephalic and atraumatic.  Mouth/Throat: Oropharynx is clear and moist.  Eyes: Conjunctivae and EOM are normal. Pupils are equal, round, and reactive to light.  Neck: Normal range of motion. Neck supple.  Cardiovascular: Normal rate, regular rhythm, normal heart sounds and intact distal pulses.   No extremity edema.  Pulmonary/Chest: Effort normal and breath sounds  normal. She exhibits tenderness.  States chest tenderness is different than when palpated.  Abdominal: Soft. Bowel sounds are normal. She exhibits no distension. There is no tenderness.  Musculoskeletal: Normal range of motion. She exhibits no edema.  Tenderness to palpation across lower back. No edema.  Neurological: She is alert and oriented to person, place, and time.  Strength upper and lower extremities 5 out of 5 and equal bilateral, sensation intact.  Skin: Skin is warm and dry. She is not diaphoretic.  Psychiatric: She has a normal mood and affect. Her behavior is normal.    ED Course  Procedures (including critical care time) Labs Review Labs Reviewed  URINALYSIS, ROUTINE W REFLEX MICROSCOPIC - Abnormal; Notable for the following:    APPearance CLOUDY (*)    All other components within normal limits  CBC  BASIC METABOLIC PANEL  POCT I-STAT TROPONIN I   Imaging Review Dg Chest 2 View  02/22/2013   CLINICAL DATA:  Pain  EXAM: CHEST  2 VIEW  COMPARISON:  None.  FINDINGS: There is no edema or consolidation. Heart size and pulmonary vascularity are normal. No adenopathy. No pneumothorax. No bone lesions. Patient is status post median sternotomy.  IMPRESSION: No edema or consolidation.   Electronically Signed   By: Bretta Bang M.D.   On: 02/22/2013 13:42   Dg Lumbar Spine Complete  02/22/2013   CLINICAL DATA:  Two-day history of low back pain.  EXAM: LUMBAR SPINE - COMPLETE 4+ VIEW  COMPARISON:  None.  FINDINGS: No fracture. No subluxation. There is mild loss of disc height at L3-4 and L4-5. The facets are well aligned bilaterally. SI joints are normal. Phleboliths are seen over the anatomic pelvis bilaterally.  IMPRESSION: Mild degenerative changes in lower lumbar discs. Otherwise unremarkable study.   Electronically Signed   By: Kennith Center M.D.   On: 02/22/2013 13:44    EKG Interpretation   None       MDM  No diagnosis found.  Patient presenting with chest and  back pain. Patient feels as if these 2 symptoms are separate. Chest pain is nonradiating. She is well appearing and in no apparent distress. Normal vital signs. Low risk heart score 2. EKG normal sinus rhythm. Labs and UA pending. Chest x-ray and lumbar spine x-ray pending. Generalized tenderness across lower back. Will give SL nitro, aspirin.  3:25 PM No change in symptoms after nitroglycerin, chest pain improved after receiving morphine. Back pain still present. Will give Valium and reassess. Urinalysis without any signs of infection or blood. Troponin negative. CBC and BMP unremarkable. EKG normal. Chest x-ray normal, and lumbar spine x-ray showing mild degenerative changes, otherwise normal. No red flags concerning patient's back pain. No focal neurologic deficits, afebrile, no signs of cauda equina.  BP stable, pain non-radiating, doubt dissection. No risk factors. Non-smoker. 4:00 PM Discussed patient with Dr. Silverio Lay. Given history of myasthenia gravis, will evaluate respiratory status with negative inspiratory force and vital capacity. Dilaudid for pain. If NIF normal, discharge home with pain medication. Pt discussed with Fayrene Helper, PA-C at shift change who will take over care of pt at this time.  Trevor Mace, PA-C 02/22/13 [phone]

## 2013-02-23 NOTE — ED Provider Notes (Signed)
Medical screening examination/treatment/procedure(s) were performed by non-physician practitioner and as supervising physician I was immediately available for consultation/collaboration.  EKG Interpretation   None         Junius Argyle, MD 02/23/13 1139

## 2013-02-26 ENCOUNTER — Encounter (HOSPITAL_BASED_OUTPATIENT_CLINIC_OR_DEPARTMENT_OTHER): Payer: Self-pay | Admitting: *Deleted

## 2013-02-28 DIAGNOSIS — C439 Malignant melanoma of skin, unspecified: Secondary | ICD-10-CM

## 2013-02-28 HISTORY — DX: Malignant melanoma of skin, unspecified: C43.9

## 2013-02-28 HISTORY — PX: MELANOMA EXCISION: SHX5266

## 2013-02-28 NOTE — ED Provider Notes (Signed)
Medical screening examination/treatment/procedure(s) were performed by non-physician practitioner and as supervising physician I was immediately available for consultation/collaboration.  EKG Interpretation    Date/Time:  Friday February 22 2013 12:33:05 EST Ventricular Rate:  94 PR Interval:  138 QRS Duration: 93 QT Interval:  374 QTC Calculation: 468 R Axis:   34 Text Interpretation:  Sinus rhythm ED PHYSICIAN INTERPRETATION AVAILABLE IN [facility] Confirmed by TEST, RECORD ([id number]) on 02/24/2013 8:26:04 AM              Wandra Arthurs, MD 02/28/13 1454

## 2013-03-19 ENCOUNTER — Other Ambulatory Visit: Payer: Self-pay | Admitting: Neurosurgery

## 2013-03-19 DIAGNOSIS — M5136 Other intervertebral disc degeneration, lumbar region: Secondary | ICD-10-CM

## 2013-03-26 ENCOUNTER — Ambulatory Visit
Admission: RE | Admit: 2013-03-26 | Discharge: 2013-03-26 | Disposition: A | Discharge status: Home / Self Care | Payer: BC Managed Care – PPO | Source: Ambulatory Visit | Attending: Neurosurgery | Admitting: Neurosurgery

## 2013-03-26 VITALS — BP 132/69 | HR 96

## 2013-03-26 DIAGNOSIS — M51369 Other intervertebral disc degeneration, lumbar region without mention of lumbar back pain or lower extremity pain: Secondary | ICD-10-CM

## 2013-03-26 DIAGNOSIS — M5136 Other intervertebral disc degeneration, lumbar region: Secondary | ICD-10-CM

## 2013-03-26 MED ORDER — IOHEXOL 180 MG/ML  SOLN
15.0000 mL | Freq: Once | INTRAMUSCULAR | Status: AC | PRN
  Administered 2013-03-26: 15 mL via INTRATHECAL

## 2013-03-26 MED ORDER — ONDANSETRON HCL 4 MG/2ML IJ SOLN
4.0000 mg | Freq: Once | INTRAMUSCULAR | Status: AC
  Administered 2013-03-26: 4 mg via INTRAMUSCULAR

## 2013-03-26 MED ORDER — MEPERIDINE HCL 100 MG/ML IJ SOLN
100.0000 mg | Freq: Once | INTRAMUSCULAR | Status: AC
  Administered 2013-03-26: 100 mg via INTRAMUSCULAR

## 2013-03-26 MED ORDER — DIAZEPAM 5 MG PO TABS
10.0000 mg | ORAL_TABLET | Freq: Once | ORAL | Status: AC
  Administered 2013-03-26: 10 mg via ORAL

## 2013-03-26 NOTE — Progress Notes (Signed)
Discharge instructions explained to pt. 

## 2013-03-26 NOTE — Discharge Instructions (Signed)

## 2013-11-05 DIAGNOSIS — C4372 Malignant melanoma of left lower limb, including hip: Secondary | ICD-10-CM | POA: Insufficient documentation

## 2013-11-22 HISTORY — PX: MELANOMA EXCISION: SHX5266

## 2013-12-12 HISTORY — PX: INGUINAL LYMPH NODE BIOPSY: SHX5865

## 2014-09-05 DIAGNOSIS — R7989 Other specified abnormal findings of blood chemistry: Secondary | ICD-10-CM | POA: Insufficient documentation

## 2014-09-05 DIAGNOSIS — R945 Abnormal results of liver function studies: Secondary | ICD-10-CM | POA: Insufficient documentation

## 2016-01-29 HISTORY — PX: TOTAL KNEE ARTHROPLASTY: SHX125

## 2016-02-29 HISTORY — PX: LUMBAR FUSION: SHX111

## 2016-04-26 ENCOUNTER — Other Ambulatory Visit: Payer: Self-pay

## 2016-09-15 DIAGNOSIS — Z9109 Other allergy status, other than to drugs and biological substances: Secondary | ICD-10-CM | POA: Insufficient documentation

## 2016-09-15 DIAGNOSIS — M48062 Spinal stenosis, lumbar region with neurogenic claudication: Secondary | ICD-10-CM | POA: Insufficient documentation

## 2016-09-15 DIAGNOSIS — D510 Vitamin B12 deficiency anemia due to intrinsic factor deficiency: Secondary | ICD-10-CM | POA: Insufficient documentation

## 2016-09-15 DIAGNOSIS — M545 Low back pain, unspecified: Secondary | ICD-10-CM | POA: Insufficient documentation

## 2016-09-15 DIAGNOSIS — F32A Depression, unspecified: Secondary | ICD-10-CM | POA: Insufficient documentation

## 2016-09-15 DIAGNOSIS — M170 Bilateral primary osteoarthritis of knee: Secondary | ICD-10-CM | POA: Insufficient documentation

## 2016-09-15 DIAGNOSIS — M47817 Spondylosis without myelopathy or radiculopathy, lumbosacral region: Secondary | ICD-10-CM | POA: Insufficient documentation

## 2016-09-15 DIAGNOSIS — F329 Major depressive disorder, single episode, unspecified: Secondary | ICD-10-CM | POA: Insufficient documentation

## 2016-09-15 DIAGNOSIS — G8929 Other chronic pain: Secondary | ICD-10-CM | POA: Insufficient documentation

## 2016-09-15 DIAGNOSIS — R600 Localized edema: Secondary | ICD-10-CM | POA: Insufficient documentation

## 2016-09-15 DIAGNOSIS — M4310 Spondylolisthesis, site unspecified: Secondary | ICD-10-CM | POA: Insufficient documentation

## 2016-10-04 ENCOUNTER — Encounter: Payer: Self-pay | Admitting: Obstetrics and Gynecology

## 2016-11-23 ENCOUNTER — Encounter: Payer: Self-pay | Admitting: Physician Assistant

## 2016-11-30 ENCOUNTER — Encounter (INDEPENDENT_AMBULATORY_CARE_PROVIDER_SITE_OTHER): Payer: Self-pay

## 2016-11-30 ENCOUNTER — Ambulatory Visit (INDEPENDENT_AMBULATORY_CARE_PROVIDER_SITE_OTHER): Payer: BLUE CROSS/BLUE SHIELD | Admitting: Physician Assistant

## 2016-11-30 ENCOUNTER — Other Ambulatory Visit: Payer: BLUE CROSS/BLUE SHIELD

## 2016-11-30 ENCOUNTER — Encounter: Payer: Self-pay | Admitting: Physician Assistant

## 2016-11-30 VITALS — BP 126/68 | HR 72 | Ht 67.5 in | Wt 236.0 lb

## 2016-11-30 DIAGNOSIS — K219 Gastro-esophageal reflux disease without esophagitis: Secondary | ICD-10-CM

## 2016-11-30 DIAGNOSIS — R197 Diarrhea, unspecified: Secondary | ICD-10-CM | POA: Diagnosis not present

## 2016-11-30 DIAGNOSIS — Z1212 Encounter for screening for malignant neoplasm of rectum: Secondary | ICD-10-CM

## 2016-11-30 DIAGNOSIS — R109 Unspecified abdominal pain: Secondary | ICD-10-CM | POA: Diagnosis not present

## 2016-11-30 DIAGNOSIS — Z1211 Encounter for screening for malignant neoplasm of colon: Secondary | ICD-10-CM

## 2016-11-30 MED ORDER — DEXLANSOPRAZOLE 60 MG PO CPDR: 60.0000 mg | DELAYED_RELEASE_CAPSULE | Freq: Every day | ORAL | 1 refills | Status: DC

## 2016-11-30 MED ORDER — NA SULFATE-K SULFATE-MG SULF 17.5-3.13-1.6 GM/177ML PO SOLN
1.0000 | ORAL | 0 refills | Status: DC
Start: 2016-11-30 — End: 2017-03-02

## 2016-11-30 MED ORDER — HYOSCYAMINE SULFATE 0.125 MG SL SUBL: 0.1250 mg | SUBLINGUAL_TABLET | SUBLINGUAL | 1 refills | Status: DC | PRN

## 2016-11-30 NOTE — Patient Instructions (Addendum)
Your physician has requested that you go to the basement for lab work before leaving today.  We have sent the following medications to your pharmacy for you to pick up at your convenience:  Hyoscyamine 0.125 mg every 4-6 hours as needed for pain and cramping. Dexilant 60 mg daily   Hold Omeprazole while using dexilant.   Start Align probiotic once a day.   You have been scheduled for an endoscopy and colonoscopy. Please follow the written instructions given to you at your visit today. Please pick up your prep supplies at the pharmacy within the next 1-3 days. If you use inhalers (even only as needed), please bring them with you on the day of your procedure. Your physician has requested that you go to www.startemmi.com and enter the access code given to you at your visit today. This web site gives a general overview about your procedure. However, you should still follow specific instructions given to you by our office regarding your preparation for the procedure.

## 2016-11-30 NOTE — Progress Notes (Signed)
GI physician assistant Initial assessment and plans reviewed

## 2016-11-30 NOTE — Progress Notes (Signed)
Chief Complaint: GERD, epigastric pain  HPI:    [name] is a 56 year old Caucasian female with a past medical history as listed below, who was referred to me by Briana Bien, MD for a complaint of GERD and epigastric pain .      Patient previoulsy followed in clinic with [name] and was last seen 10/31/2003 for a colonoscopy done for diarrhea and hematochezia with abdominal pain and bloating. 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    Today, the patient presents to clinic and explains that she was diagnosed with Myasthenia gravis many years ago and had a fistula around that time and someone suggested that she may have IBD. Patient tells me she has had a colonoscopy since that time and does not believe she was ever told she had Crohn's. Today, she complains that she has mostly loose diarrheal bowel movements which are sometimes water and other times "not as bad" and occasionally1-2 days a month she will have a normal solid stool. This diarrhea is typically associated with a lot of abdominal cramping. This occurs at least 3 times a day and will often "bend me over". Patient has never tried anything to help make this better. She has used occasional Imodium which does stop the diarrhea and will stop her up for a couple of days.    Patient also describes a history of reflux which is chronic for her. She has tried Protonix and Nexium in the past and is currently onOomeprazole 40 mg twice a day. Patient tells me this still will wake her up in the middle of the night and will feel as though she is "choking on acid". Patient also describes this being worse after eating certain foods throughout the day. She has actually never been able to control this symptom.   Patient denies fever, chills, blood in her stool, melena, weight loss, fatigue, anorexia, nausea or vomiting.  Past Medical History:  Diagnosis Date  . Anemia   . Arthritis   . Asthma   .  Myasthenia gravis     Past Surgical History:  Procedure Laterality Date  . APPENDECTOMY    . THYMECTOMY    . WRIST SURGERY Left     Current Outpatient Prescriptions  Medication Sig Dispense Refill  . omeprazole (PRILOSEC) 10 MG capsule Take 10 mg by mouth daily.    Marland Kitchen oxyCODONE-acetaminophen (PERCOCET) 5-325 MG per tablet Take 2 tablets by mouth every 4 (four) hours as needed. 10 tablet 0  . pantoprazole (PROTONIX) 20 MG tablet Take 20 mg by mouth 2 (two) times daily.    . pramipexole (MIRAPEX) 1 MG tablet Take 1 mg by mouth 3 (three) times daily.    . predniSONE (DELTASONE) 20 MG tablet 3 tabs po day one, then 2 tabs daily x 4 days 11 tablet 0  . promethazine (PHENERGAN) 25 MG tablet One every 6 hours for nausea or headache 20 tablet 0  . pyridostigmine (MESTINON) 60 MG tablet Take 1 tablet (60 mg total) by mouth 2 (two) times daily. TAKE 1/2 TAB BID AFTER BREAKFAST AND LUNCH 30 tablet 0  . tiZANidine (ZANAFLEX) 2 MG tablet Take 1 tablet (2 mg total) by mouth at bedtime as needed. 30 tablet 0  . tiZANidine (ZANAFLEX) 4 MG capsule Take 4 mg by mouth daily.     Current Facility-Administered Medications  Medication Dose Route Frequency Provider Last Rate Last Dose  . promethazine (PHENERGAN) injection 50 mg  50 mg  Intramuscular Q6H PRN Robyn Haber, MD   50 mg at 07/11/12 1439    Allergies as of 11/30/2016  . (No Known Allergies)    Family History  Problem Relation Age of Onset  . COPD Mother   . Cancer Father     Social History   Social History  . Marital status: Married    Spouse name: N/A  . Number of children: N/A  . Years of education: N/A   Occupational History  . Not on file.   Social History Main Topics  . Smoking status: Never Smoker  . Smokeless tobacco: Not on file  . Alcohol use 0.5 oz/week    1 drink(s) per week     Comment: occassionally  . Drug use: No  . Sexual activity: Yes    Birth control/ protection: None   Other Topics Concern  . Not on  file   Social History Narrative   ** Merged History Encounter **        Review of Systems:    Constitutional: No weight loss, fever or chills Skin: No rash Cardiovascular: No chest pain Respiratory: No SOB Gastrointestinal: See HPI and otherwise negative Genitourinary: No dysuria  Neurological: No headache, dizziness or syncope Musculoskeletal: No new muscle or joint pain Hematologic: No bleeding  Psychiatric: No history of depression or anxiety   Physical Exam:  Vital signs: BP 126/68   Pulse 72   Ht 5' 7.5" (1.715 m)   Wt 236 lb (107 kg)   BMI 36.42 kg/m    Constitutional:   Pleasant overweight Caucasian female appears to be in NAD, Well developed, Well nourished, alert and cooperative Head:  Normocephalic and atraumatic. Eyes:   PEERL, EOMI. No icterus. Conjunctiva pink. Ears:  Normal auditory acuity. Neck:  Supple Throat: Oral cavity and pharynx without inflammation, swelling or lesion.  Respiratory: Respirations even and unlabored. Lungs clear to auscultation bilaterally.   No wheezes, crackles, or rhonchi.  Cardiovascular: Normal S1, S2. No MRG. Regular rate and rhythm. No peripheral edema, cyanosis or pallor.  Gastrointestinal:  Soft, nondistended, mild generalized ttp, No rebound or guarding. Normal bowel sounds. No appreciable masses or hepatomegaly. Rectal:  Not performed.  Msk:  Symmetrical without gross deformities. Without edema, no deformity or joint abnormality.  Neurologic:  Alert and  oriented x4;  grossly normal neurologically.  Skin:   Dry and intact without significant lesions or rashes. Psychiatric: Demonstrates good judgement and reason without abnormal affect or behaviors.  No recent labs or imaging.  Assessment: 1. GERD: Chronic for the patient, and previously uncontrolled on Protonix and Nexium, some control with Omeprazole 40 mg twice a day but continues with breakthrough symptoms at night 2. Abdominal pain: With above as well as abdominal  cramping pains at least 3-4 times a day which result in diarrheal stool; consider most likely IBS D  3. Screening for colorectal cancer: Patient is due a colonoscopy 4. Diarrhea: 3-4 times a day, consider IBS as above versus infectious cause vs pancreatic insufficiency vs other  Plan: 1. Discussed with patient that her symptoms of loose stool and abdominal cramping could still be related to IBS which was diagnosed at time of her last colonoscopy. 2. Prescribed Hyoscyamine sulfate 0.125 mg every 4-6 hours as needed for abdominal cramping and diarrhea, #30 with 1 refill 3. Started patient on Dexilant 60 mg daily. She should discontinue her Omeprazole while using this. 4. Discussed that the patient could use Zantac 150 mg twice a day along with medication above  if her symptoms are not controlled 5. Reviewed antireflux diet and lifestyle modifications. 6. Ordered stool studies to include GI pathogen panel, O&P and fecal pancreatic elastase 7. Recommend the patient start a probiotic such as Align after time of her colonoscopy 8. Scheduled patient for an EGD and colonoscopy in [place] with Dr. Henrene Pastor. Discussed risks, benefits, limitations and alternatives and the patient agrees to proceed. 9. We will try to request previous records 10. Patient to follow in clinic per recommendations from BrianaPerry after time of procedures   Briana Newer, PA-C [facility] 11/30/2016, 10:51 AM  Cc: Briana Bien, MD

## 2017-01-17 ENCOUNTER — Encounter: Payer: Self-pay | Admitting: Internal Medicine

## 2017-01-27 ENCOUNTER — Ambulatory Visit (AMBULATORY_SURGERY_CENTER): Payer: BLUE CROSS/BLUE SHIELD | Admitting: Internal Medicine

## 2017-01-27 ENCOUNTER — Other Ambulatory Visit: Payer: Self-pay

## 2017-01-27 ENCOUNTER — Encounter: Payer: Self-pay | Admitting: Internal Medicine

## 2017-01-27 VITALS — BP 104/77 | HR 93 | Temp 99.1°F | Resp 13 | Ht 67.0 in | Wt 236.0 lb

## 2017-01-27 DIAGNOSIS — Z1211 Encounter for screening for malignant neoplasm of colon: Secondary | ICD-10-CM

## 2017-01-27 DIAGNOSIS — K449 Diaphragmatic hernia without obstruction or gangrene: Secondary | ICD-10-CM

## 2017-01-27 DIAGNOSIS — R109 Unspecified abdominal pain: Secondary | ICD-10-CM

## 2017-01-27 DIAGNOSIS — K222 Esophageal obstruction: Secondary | ICD-10-CM

## 2017-01-27 DIAGNOSIS — K219 Gastro-esophageal reflux disease without esophagitis: Secondary | ICD-10-CM | POA: Diagnosis not present

## 2017-01-27 DIAGNOSIS — R197 Diarrhea, unspecified: Secondary | ICD-10-CM | POA: Diagnosis not present

## 2017-01-27 DIAGNOSIS — K599 Functional intestinal disorder, unspecified: Secondary | ICD-10-CM | POA: Diagnosis not present

## 2017-01-27 MED ORDER — SODIUM CHLORIDE 0.9 % IV SOLN: 500.0000 mL | INTRAVENOUS | Status: DC

## 2017-01-27 NOTE — Op Note (Signed)
[name] Procedure Date: 01/27/2017 2:28 PM MRN: [id number] Endoscopist: Docia Chuck. Henrene Pastor , MD Age: 56 Referring MD:  Date of Birth: 05/13/1960 Gender: Female Account #: [id number] Procedure:                Colonoscopy, with biopsies Indications:              Screening for colorectal malignant neoplasm,                            Incidental abdominal pain noted, Incidental                            diarrhea noted Medicines:                Monitored Anesthesia Care Procedure:                Pre-Anesthesia Assessment:                           - Prior to the procedure, a History and Physical                            was performed, and patient medications and                            allergies were reviewed. The patient's tolerance of                            previous anesthesia was also reviewed. The risks                            and benefits of the procedure and the sedation                            options and risks were discussed with the patient.                            All questions were answered, and informed consent                            was obtained. Prior Anticoagulants: The patient has                            taken no previous anticoagulant or antiplatelet                            agents. ASA Grade Assessment: II - A patient with                            mild systemic disease. After reviewing the risks                            and benefits, the patient was deemed in  satisfactory condition to undergo the procedure.                           After obtaining informed consent, the colonoscope                            was passed under direct vision. Throughout the                            procedure, the patient's blood pressure, pulse, and                            oxygen saturations were monitored continuously. The                            Colonoscope was introduced through  the anus and                            advanced to the the cecum, identified by                            appendiceal orifice and ileocecal valve. The                            terminal ileum, ileocecal valve, appendiceal                            orifice, and rectum were photographed. The quality                            of the bowel preparation was excellent. The                            colonoscopy was performed without difficulty. The                            patient tolerated the procedure well. The bowel                            preparation used was SUPREP. Scope In: 2:40:26 PM Scope Out: 2:57:36 PM Scope Withdrawal Time: 0 hours 13 minutes 9 seconds  Total Procedure Duration: 0 hours 17 minutes 10 seconds  Findings:                 The terminal ileum appeared normal.                           A few small-mouthed diverticula were found in the                            sigmoid colon.                           The entire examined colon appeared normal on direct  and retroflexion views. Biopsies for histology were                            taken with a cold forceps from the entire colon for                            evaluation of microscopic colitis. Complications:            No immediate complications. Estimated blood loss:                            None. Estimated Blood Loss:     Estimated blood loss: none. Impression:               - The examined portion of the ileum was normal.                           - Diverticulosis in the sigmoid colon(mild).                           - The examination was otherwise normal on direct                            and retroflexion views. Random colon biopsies                            obtained. Recommendation:           - Repeat colonoscopy in 10 years for screening                            purposes.                           - Patient has a contact number available for                             emergencies. The signs and symptoms of potential                            delayed complications were discussed with the                            patient. Return to normal activities tomorrow.                            Written discharge instructions were provided to the                            patient.                           - Resume previous diet.                           - Continue present medications.                           -  Await pathology results.                           - EGD today. Please see report Docia Chuck. Henrene Pastor, MD 01/27/2017 3:10:04 PM This report has been signed electronically.

## 2017-01-27 NOTE — Progress Notes (Signed)
Pt's states no medical or surgical changes since previsit or office visit. 

## 2017-01-27 NOTE — Progress Notes (Signed)
Called to room to assist during endoscopic procedure.  Patient ID and intended procedure confirmed with present staff. Received instructions for my participation in the procedure from the performing physician.  

## 2017-01-27 NOTE — Progress Notes (Signed)
To recovery, report to RN, VSS. 

## 2017-01-27 NOTE — Patient Instructions (Signed)
YOU HAD AN ENDOSCOPIC PROCEDURE TODAY AT [facility]:   Refer to the procedure report that was given to you for any specific questions about what was found during the examination.  If the procedure report does not answer your questions, please call your gastroenterologist to clarify.  If you requested that your care partner not be given the details of your procedure findings, then the procedure report has been included in a sealed envelope for you to review at your convenience later.  YOU SHOULD EXPECT: Some feelings of bloating in the abdomen. Passage of more gas than usual.  Walking can help get rid of the air that was put into your GI tract during the procedure and reduce the bloating. If you had a lower endoscopy (such as a colonoscopy or flexible sigmoidoscopy) you may notice spotting of blood in your stool or on the toilet paper. If you underwent a bowel prep for your procedure, you may not have a normal bowel movement for a few days.  Please Note:  You might notice some irritation and congestion in your nose or some drainage.  This is from the oxygen used during your procedure.  There is no need for concern and it should clear up in a day or so.  SYMPTOMS TO REPORT IMMEDIATELY:   Following lower endoscopy (colonoscopy or flexible sigmoidoscopy):  Excessive amounts of blood in the stool  Significant tenderness or worsening of abdominal pains  Swelling of the abdomen that is new, acute  Fever of 100F or higher   Following upper endoscopy (EGD)  Vomiting of blood or coffee ground material  New chest pain or pain under the shoulder blades  Painful or persistently difficult swallowing  New shortness of breath  Fever of 100F or higher  Black, tarry-looking stools  For urgent or emergent issues, a gastroenterologist can be reached at any hour by calling [phone].   DIET:  We do recommend a small meal at first, but then you may proceed to your regular diet.  Drink  plenty of fluids but you should avoid alcoholic beverages for 24 hours.  ACTIVITY:  You should plan to take it easy for the rest of today and you should NOT DRIVE or use heavy machinery until tomorrow (because of the sedation medicines used during the test).    FOLLOW UP: Our staff will call the number listed on your records the next business day following your procedure to check on you and address any questions or concerns that you may have regarding the information given to you following your procedure. If we do not reach you, we will leave a message.  However, if you are feeling well and you are not experiencing any problems, there is no need to return our call.  We will assume that you have returned to your regular daily activities without incident.  If any biopsies were taken you will be contacted by phone or by letter within the next 1-3 weeks.  Please call us at [phone] if you have not heard about the biopsies in 3 weeks.    SIGNATURES/CONFIDENTIALITY: You and/or your care partner have signed paperwork which will be entered into your electronic medical record.  These signatures attest to the fact that that the information above on your After Visit Summary has been reviewed and is understood.  Full responsibility of the confidentiality of this discharge information lies with you and/or your care-partner.  Diverticulosis, hiatal hernia, and stricture

## 2017-01-27 NOTE — Op Note (Signed)
[name] Patient Name: [name] Procedure Date: 01/27/2017 2:28 PM MRN: [id number] Endoscopist: Docia Chuck. Henrene Pastor , MD Age: 56 Referring MD:  Date of Birth: Mar 16, 1960 Gender: Female Account #: [id number] Procedure:                Upper GI endoscopy Indications:              Esophageal reflux Medicines:                Monitored Anesthesia Care Procedure:                Pre-Anesthesia Assessment:                           - Prior to the procedure, a History and Physical                            was performed, and patient medications and                            allergies were reviewed. The patient's tolerance of                            previous anesthesia was also reviewed. The risks                            and benefits of the procedure and the sedation                            options and risks were discussed with the patient.                            All questions were answered, and informed consent                            was obtained. Prior Anticoagulants: The patient has                            taken no previous anticoagulant or antiplatelet                            agents. ASA Grade Assessment: II - A patient with                            mild systemic disease. After reviewing the risks                            and benefits, the patient was deemed in                            satisfactory condition to undergo the procedure.                           After obtaining informed consent, the endoscope was  passed under direct vision. Throughout the                            procedure, the patient's blood pressure, pulse, and                            oxygen saturations were monitored continuously. The                            Endoscope was introduced through the mouth, and                            advanced to the second part of duodenum. The upper                            GI endoscopy was accomplished  without difficulty.                            The patient tolerated the procedure well. Scope In: Scope Out: Findings:                 One moderate benign-appearing, intrinsic stenosis                            was found 30 cm from the incisors. This measured                            1.5 cm (inner diameter) and was traversed.                           The esophagus was otherwise normal.                           A large hiatal hernia was present. This measured                            approximately 7 cm.                           The stomach revealed several small benign fundic                            gland type polyps but otherwise was normal.                           The examined duodenum was normal.                           The cardia and gastric fundus were normal on                            retroflexion. Complications:            No immediate complications. Estimated Blood Loss:     Estimated blood loss: none. Impression:  1. GERD                           2. Esophageal stricture                           3. Large hiatal hernia                           4. Incidental benign fundic gland type polyps                           5. Otherwise normal exam Recommendation:           1. Reflux precautions. The nurse will provide you                            with information to review                           2. Weight loss. This is critically important for                            optimal control of your reflux disease                           3. Continue current medications                           4. Schedule routine office follow-up with Dr. Henrene Pastor                            at your convenience. Docia Chuck. Henrene Pastor, MD 01/27/2017 3:17:12 PM This report has been signed electronically.

## 2017-01-30 ENCOUNTER — Telehealth: Payer: Self-pay | Admitting: *Deleted

## 2017-01-30 NOTE — Telephone Encounter (Signed)
  Follow up Call-  Call back number 01/27/2017  Post procedure Call Back phone  # [phone]  Permission to leave phone message Yes  Some recent data might be hidden     Patient questions:  Do you have a fever, pain , or abdominal swelling? No. Pain Score  0 *  Have you tolerated food without any problems? Yes.    Have you been able to return to your normal activities? Yes.    Do you have any questions about your discharge instructions: Diet   No. Medications  No. Follow up visit  No.  Do you have questions or concerns about your Care? No.  Actions: * If pain score is 4 or above: No action needed, pain <4.

## 2017-02-02 ENCOUNTER — Encounter: Payer: Self-pay | Admitting: Internal Medicine

## 2017-02-08 ENCOUNTER — Other Ambulatory Visit: Payer: Self-pay | Admitting: Physician Assistant

## 2017-02-08 ENCOUNTER — Other Ambulatory Visit: Payer: Self-pay | Admitting: Neurosurgery

## 2017-02-13 ENCOUNTER — Other Ambulatory Visit: Payer: Self-pay | Admitting: Emergency Medicine

## 2017-02-13 MED ORDER — DEXLANSOPRAZOLE 60 MG PO CPDR: 60.0000 mg | DELAYED_RELEASE_CAPSULE | Freq: Every day | ORAL | 1 refills | Status: DC

## 2017-02-22 ENCOUNTER — Other Ambulatory Visit: Payer: Self-pay

## 2017-02-22 MED ORDER — HYOSCYAMINE SULFATE 0.125 MG SL SUBL: 0.1250 mg | SUBLINGUAL_TABLET | SUBLINGUAL | 0 refills | Status: DC | PRN

## 2017-02-22 NOTE — Pre-Procedure Instructions (Signed)
Dariel Pellecchia  02/22/2017      [id number] [place], [place] - [address]  [address] Phone: [phone] Fax: [phone]    Your procedure is scheduled on Wednesday January 2.  Report to [facility] Admitting at 9:45 A.M.  Call this number if you have problems the morning of surgery:  [phone]   Remember:  Do not eat food or drink liquids after midnight.  Take these medicines the morning of surgery with A SIP OF WATER:   Dexlansoprazole (Dexilant) Omeprazole Tapentadol (Nucynta) Albuterol inhaler if needed (please bring to the hospital with you)   7 days prior to surgery STOP taking any Aspirin(unless otherwise instructed by your surgeon), nabumetone (relafen), Aleve, Naproxen, Ibuprofen, Motrin, Advil, Goody's, BC's, all herbal medications, fish oil, and all vitamins     Do not wear jewelry, make-up or nail polish.  Do not wear lotions, powders, or perfumes, or deodorant.  Do not shave 48 hours prior to surgery.  Men may shave face and neck.  Do not bring valuables to the hospital.  [facility] is not responsible for any belongings or valuables.  Contacts, dentures or bridgework may not be worn into surgery.  Leave your suitcase in the car.  After surgery it may be brought to your room.  For patients admitted to the hospital, discharge time will be determined by your treatment team.  Patients discharged the day of surgery will not be allowed to drive home.    Special instructions:    [facility]- Preparing For Surgery  Before surgery, you can play an important role. Because skin is not sterile, your skin needs to be as free of germs as possible. You can reduce the number of germs on your skin by washing with CHG (chlorahexidine gluconate) Soap before surgery.  CHG is an antiseptic cleaner which kills germs and bonds with the skin to continue killing germs even after washing.  Please do not use if you have an  allergy to CHG or antibacterial soaps. If your skin becomes reddened/irritated stop using the CHG.  Do not shave (including legs and underarms) for at least 48 hours prior to first CHG shower. It is OK to shave your face.  Please follow these instructions carefully.   1. Shower the NIGHT BEFORE SURGERY and the MORNING OF SURGERY with CHG.   2. If you chose to wash your hair, wash your hair first as usual with your normal shampoo.  3. After you shampoo, rinse your hair and body thoroughly to remove the shampoo.  4. Use CHG as you would any other liquid soap. You can apply CHG directly to the skin and wash gently with a scrungie or a clean washcloth.   5. Apply the CHG Soap to your body ONLY FROM THE NECK DOWN.  Do not use on open wounds or open sores. Avoid contact with your eyes, ears, mouth and genitals (private parts). Wash Face and genitals (private parts)  with your normal soap.  6. Wash thoroughly, paying special attention to the area where your surgery will be performed.  7. Thoroughly rinse your body with warm water from the neck down.  8. DO NOT shower/wash with your normal soap after using and rinsing off the CHG Soap.  9. Pat yourself dry with a CLEAN TOWEL.  10. Wear CLEAN PAJAMAS to bed the night before surgery, wear comfortable clothes the morning of surgery  11. Place CLEAN SHEETS on your bed the  night of your first shower and DO NOT SLEEP WITH PETS.    Day of Surgery: Do not apply any deodorants/lotions. Please wear clean clothes to the hospital/surgery center.      Please read over the following fact sheets that you were given. Coughing and Deep Breathing, MRSA Information and Surgical Site Infection Prevention

## 2017-02-22 NOTE — Progress Notes (Signed)
[facility] day [facility]

## 2017-02-23 ENCOUNTER — Other Ambulatory Visit: Payer: Self-pay

## 2017-02-23 ENCOUNTER — Encounter (HOSPITAL_COMMUNITY)
Admission: RE | Admit: 2017-02-23 | Discharge: 2017-02-23 | Disposition: A | Discharge status: Home / Self Care | Payer: BLUE CROSS/BLUE SHIELD | Source: Ambulatory Visit | Attending: Neurosurgery | Admitting: Neurosurgery

## 2017-02-23 ENCOUNTER — Encounter (HOSPITAL_COMMUNITY): Payer: Self-pay

## 2017-02-23 DIAGNOSIS — M4316 Spondylolisthesis, lumbar region: Secondary | ICD-10-CM | POA: Diagnosis not present

## 2017-02-23 DIAGNOSIS — Z01812 Encounter for preprocedural laboratory examination: Secondary | ICD-10-CM | POA: Insufficient documentation

## 2017-02-23 HISTORY — DX: Gastro-esophageal reflux disease without esophagitis: K21.9

## 2017-02-23 HISTORY — DX: Personal history of urinary calculi: Z87.442

## 2017-02-23 HISTORY — DX: Sleep apnea, unspecified: G47.30

## 2017-02-23 HISTORY — DX: Personal history of other diseases of the digestive system: Z87.19

## 2017-02-23 HISTORY — DX: Pneumonia, unspecified organism: J18.9

## 2017-02-23 LAB — TYPE AND SCREEN
ABO/RH(D): O NEG
ANTIBODY SCREEN: NEGATIVE

## 2017-02-23 LAB — BASIC METABOLIC PANEL
ANION GAP: 6 (ref 5–15)
BUN: 16 mg/dL (ref 6–20)
CALCIUM: 8.9 mg/dL (ref 8.9–10.3)
CO2: 30 mmol/L (ref 22–32)
Chloride: 102 mmol/L (ref 101–111)
Creatinine, Ser: 0.69 mg/dL (ref 0.44–1.00)
Glucose, Bld: 104 mg/dL — ABNORMAL HIGH (ref 65–99)
POTASSIUM: 4 mmol/L (ref 3.5–5.1)
Sodium: 138 mmol/L (ref 135–145)

## 2017-02-23 LAB — SURGICAL PCR SCREEN
MRSA, PCR: NEGATIVE
STAPHYLOCOCCUS AUREUS: NEGATIVE

## 2017-02-23 LAB — CBC
HEMATOCRIT: 37.8 % (ref 36.0–46.0)
Hemoglobin: 12 g/dL (ref 12.0–15.0)
MCH: 27.8 pg (ref 26.0–34.0)
MCHC: 31.7 g/dL (ref 30.0–36.0)
MCV: 87.7 fL (ref 78.0–100.0)
Platelets: 294 10*3/uL (ref 150–400)
RBC: 4.31 MIL/uL (ref 3.87–5.11)
RDW: 14.7 % (ref 11.5–15.5)
WBC: 7 10*3/uL (ref 4.0–10.5)

## 2017-02-23 NOTE — Progress Notes (Signed)
Pt. moved here recently from [place], but prior to that she has lived here in [place].  She has not reestablished with PCP or oncology since returning to [facility].  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 Pt. Gives hx of Myasthenia Gravis. Denies all cardiac oriented disease. Pt. Denies all chest concerns today. Chart will be referred to anesth. For review.

## 2017-02-24 NOTE — Progress Notes (Signed)
Anesthesia Chart Review:  Pt is a 56 year old female scheduled for L3-4, L4-5 PLIF on 03/01/2016 with Kary Kos, M.D.  - No PCP yet per patient. Pt moved back to [place] this past fall.  Had annual physical by Arvella Nigh, MD with OB/Gyn 11/17/16 (notes found in media tab 11/22/16). He referred her to oncology for melanoma history and to GI for possible IBS. Pt reported at pre-admission testing she still has not established with neurology locally for myasthenia gravis.  Medication list does not include any meds for myasthenia gravis.   PMH includes: Myasthenia gravis (s/p thymectomy), OSA, anemia, melanoma, asthma, GERD. Never smoker. BMI 37.  Medications include: Albuterol, Dexilant  BP (!) 148/84   Pulse 84   Temp 36.9 C (Oral)   Resp 18   Ht 5' 7.5" (1.715 m)   Wt 240 lb 11.9 oz (109.2 kg)   SpO2 98%   BMI 37.15 kg/m   Preoperative labs reviewed.    If no changes, I anticipate pt can proceed with surgery as scheduled.   Willeen Cass, FNP-BC [facility]/Anesthesiology Phone: [phone] 02/24/2017 9:55 AM

## 2017-02-28 HISTORY — PX: LUMBAR FUSION: SHX111

## 2017-03-01 ENCOUNTER — Encounter (HOSPITAL_COMMUNITY)
Admission: RE | Disposition: A | Discharge status: Home / Self Care | Payer: Self-pay | Source: Ambulatory Visit | Attending: Neurosurgery

## 2017-03-01 ENCOUNTER — Inpatient Hospital Stay (HOSPITAL_COMMUNITY): Payer: BLUE CROSS/BLUE SHIELD | Admitting: Certified Registered Nurse Anesthetist

## 2017-03-01 ENCOUNTER — Inpatient Hospital Stay (HOSPITAL_COMMUNITY): Payer: BLUE CROSS/BLUE SHIELD | Admitting: Emergency Medicine

## 2017-03-01 ENCOUNTER — Inpatient Hospital Stay (HOSPITAL_COMMUNITY)
Admission: RE | Admit: 2017-03-01 | Discharge: 2017-03-02 | DRG: 460 | Disposition: A | Discharge status: Home / Self Care | Payer: BLUE CROSS/BLUE SHIELD | Source: Ambulatory Visit | Attending: Neurosurgery | Admitting: Neurosurgery

## 2017-03-01 ENCOUNTER — Encounter (HOSPITAL_COMMUNITY): Payer: Self-pay | Admitting: *Deleted

## 2017-03-01 ENCOUNTER — Inpatient Hospital Stay (HOSPITAL_COMMUNITY): Payer: BLUE CROSS/BLUE SHIELD

## 2017-03-01 DIAGNOSIS — K219 Gastro-esophageal reflux disease without esophagitis: Secondary | ICD-10-CM | POA: Diagnosis present

## 2017-03-01 DIAGNOSIS — M4316 Spondylolisthesis, lumbar region: Secondary | ICD-10-CM

## 2017-03-01 DIAGNOSIS — M4310 Spondylolisthesis, site unspecified: Secondary | ICD-10-CM | POA: Diagnosis present

## 2017-03-01 DIAGNOSIS — G4730 Sleep apnea, unspecified: Secondary | ICD-10-CM | POA: Diagnosis present

## 2017-03-01 DIAGNOSIS — Z96652 Presence of left artificial knee joint: Secondary | ICD-10-CM | POA: Diagnosis present

## 2017-03-01 DIAGNOSIS — R402363 Coma scale, best motor response, obeys commands, at hospital admission: Secondary | ICD-10-CM | POA: Diagnosis present

## 2017-03-01 DIAGNOSIS — Z419 Encounter for procedure for purposes other than remedying health state, unspecified: Secondary | ICD-10-CM

## 2017-03-01 DIAGNOSIS — Z881 Allergy status to other antibiotic agents status: Secondary | ICD-10-CM | POA: Diagnosis not present

## 2017-03-01 DIAGNOSIS — J45909 Unspecified asthma, uncomplicated: Secondary | ICD-10-CM | POA: Diagnosis present

## 2017-03-01 DIAGNOSIS — R402143 Coma scale, eyes open, spontaneous, at hospital admission: Secondary | ICD-10-CM | POA: Diagnosis present

## 2017-03-01 DIAGNOSIS — R402253 Coma scale, best verbal response, oriented, at hospital admission: Secondary | ICD-10-CM | POA: Diagnosis present

## 2017-03-01 DIAGNOSIS — M48061 Spinal stenosis, lumbar region without neurogenic claudication: Principal | ICD-10-CM | POA: Diagnosis present

## 2017-03-01 DIAGNOSIS — Z79899 Other long term (current) drug therapy: Secondary | ICD-10-CM | POA: Diagnosis not present

## 2017-03-01 DIAGNOSIS — G7000 Myasthenia gravis without (acute) exacerbation: Secondary | ICD-10-CM | POA: Diagnosis present

## 2017-03-01 DIAGNOSIS — Z91048 Other nonmedicinal substance allergy status: Secondary | ICD-10-CM | POA: Diagnosis not present

## 2017-03-01 SURGERY — POSTERIOR LUMBAR FUSION 2 LEVEL
Anesthesia: General | Site: Back

## 2017-03-01 MED ORDER — PROPOFOL 10 MG/ML IV BOLUS
INTRAVENOUS | Status: DC | PRN
  Administered 2017-03-01: 150 mg via INTRAVENOUS

## 2017-03-01 MED ORDER — 0.9 % SODIUM CHLORIDE (POUR BTL) OPTIME
TOPICAL | Status: DC | PRN
  Administered 2017-03-01: 1000 mL

## 2017-03-01 MED ORDER — SODIUM CHLORIDE 0.9 % IV SOLN: 250.0000 mL | INTRAVENOUS | Status: DC

## 2017-03-01 MED ORDER — ONDANSETRON HCL 4 MG PO TABS: 4.0000 mg | ORAL_TABLET | Freq: Four times a day (QID) | ORAL | Status: DC | PRN

## 2017-03-01 MED ORDER — HYDROMORPHONE HCL 1 MG/ML IJ SOLN
0.5000 mg | INTRAMUSCULAR | Status: DC | PRN
  Administered 2017-03-01 (×4): 0.5 mg via INTRAVENOUS

## 2017-03-01 MED ORDER — CYCLOBENZAPRINE HCL 10 MG PO TABS
10.0000 mg | ORAL_TABLET | Freq: Three times a day (TID) | ORAL | Status: DC | PRN
  Administered 2017-03-01 – 2017-03-02 (×2): 10 mg via ORAL
  Filled 2017-03-01: qty 1

## 2017-03-01 MED ORDER — KETOROLAC TROMETHAMINE 30 MG/ML IJ SOLN
INTRAMUSCULAR | Status: AC
  Filled 2017-03-01: qty 1

## 2017-03-01 MED ORDER — ONDANSETRON HCL 4 MG/2ML IJ SOLN
INTRAMUSCULAR | Status: DC | PRN
  Administered 2017-03-01: 4 mg via INTRAVENOUS

## 2017-03-01 MED ORDER — FENTANYL CITRATE (PF) 100 MCG/2ML IJ SOLN
INTRAMUSCULAR | Status: DC | PRN
  Administered 2017-03-01 (×3): 50 ug via INTRAVENOUS
  Administered 2017-03-01: 100 ug via INTRAVENOUS
  Administered 2017-03-01: 50 ug via INTRAVENOUS
  Administered 2017-03-01 (×2): 25 ug via INTRAVENOUS
  Administered 2017-03-01 (×3): 50 ug via INTRAVENOUS

## 2017-03-01 MED ORDER — HYOSCYAMINE SULFATE 0.125 MG SL SUBL
0.1250 mg | SUBLINGUAL_TABLET | SUBLINGUAL | Status: DC | PRN
  Filled 2017-03-01: qty 1

## 2017-03-01 MED ORDER — FENTANYL CITRATE (PF) 250 MCG/5ML IJ SOLN
INTRAMUSCULAR | Status: AC
  Filled 2017-03-01: qty 5

## 2017-03-01 MED ORDER — ALBUTEROL SULFATE (2.5 MG/3ML) 0.083% IN NEBU: 2.5000 mg | INHALATION_SOLUTION | Freq: Four times a day (QID) | RESPIRATORY_TRACT | Status: DC | PRN

## 2017-03-01 MED ORDER — VANCOMYCIN HCL 1000 MG IV SOLR
INTRAVENOUS | Status: AC
  Filled 2017-03-01: qty 1000

## 2017-03-01 MED ORDER — PANTOPRAZOLE SODIUM 40 MG PO TBEC
40.0000 mg | DELAYED_RELEASE_TABLET | Freq: Every day | ORAL | Status: DC
  Administered 2017-03-02: 40 mg via ORAL
  Filled 2017-03-01: qty 1

## 2017-03-01 MED ORDER — SODIUM CHLORIDE 0.9% FLUSH
3.0000 mL | INTRAVENOUS | Status: DC | PRN
Start: 2017-03-01 — End: 2017-03-01

## 2017-03-01 MED ORDER — CEFAZOLIN SODIUM-DEXTROSE 2-4 GM/100ML-% IV SOLN
2.0000 g | INTRAVENOUS | Status: AC
  Administered 2017-03-01 (×2): 2 g via INTRAVENOUS
  Filled 2017-03-01: qty 100

## 2017-03-01 MED ORDER — PROMETHAZINE HCL 25 MG/ML IJ SOLN: 6.2500 mg | INTRAMUSCULAR | Status: DC | PRN

## 2017-03-01 MED ORDER — LACTATED RINGERS IV SOLN
INTRAVENOUS | Status: DC
Start: 2017-03-01 — End: 2017-03-02
  Administered 2017-03-01: 25 mL/h via INTRAVENOUS
  Administered 2017-03-01: 13:00:00 via INTRAVENOUS

## 2017-03-01 MED ORDER — SODIUM CHLORIDE 0.9 % IR SOLN
Status: DC | PRN
  Administered 2017-03-01: 12:00:00

## 2017-03-01 MED ORDER — PHENYLEPHRINE 40 MCG/ML (10ML) SYRINGE FOR IV PUSH (FOR BLOOD PRESSURE SUPPORT)
PREFILLED_SYRINGE | INTRAVENOUS | Status: DC | PRN
  Administered 2017-03-01: 40 ug via INTRAVENOUS
  Administered 2017-03-01 (×2): 120 ug via INTRAVENOUS
  Administered 2017-03-01: 80 ug via INTRAVENOUS

## 2017-03-01 MED ORDER — LIDOCAINE-EPINEPHRINE 1 %-1:100000 IJ SOLN
INTRAMUSCULAR | Status: AC
  Filled 2017-03-01: qty 1

## 2017-03-01 MED ORDER — SODIUM CHLORIDE 0.9% FLUSH: 3.0000 mL | Freq: Two times a day (BID) | INTRAVENOUS | Status: DC

## 2017-03-01 MED ORDER — LIDOCAINE 2% (20 MG/ML) 5 ML SYRINGE
INTRAMUSCULAR | Status: DC | PRN
  Administered 2017-03-01: 100 mg via INTRAVENOUS

## 2017-03-01 MED ORDER — KETOROLAC TROMETHAMINE 30 MG/ML IJ SOLN
INTRAMUSCULAR | Status: DC | PRN
  Administered 2017-03-01: 30 mg via INTRAVENOUS

## 2017-03-01 MED ORDER — RISAQUAD PO CAPS
1.0000 | ORAL_CAPSULE | Freq: Every day | ORAL | Status: DC
  Filled 2017-03-01: qty 1

## 2017-03-01 MED ORDER — MIDAZOLAM HCL 5 MG/5ML IJ SOLN
INTRAMUSCULAR | Status: DC | PRN
  Administered 2017-03-01: 2 mg via INTRAVENOUS

## 2017-03-01 MED ORDER — ACETAMINOPHEN 650 MG RE SUPP: 650.0000 mg | RECTAL | Status: DC | PRN

## 2017-03-01 MED ORDER — ALBUTEROL SULFATE HFA 108 (90 BASE) MCG/ACT IN AERS: 1.0000 | INHALATION_SPRAY | Freq: Four times a day (QID) | RESPIRATORY_TRACT | Status: DC | PRN

## 2017-03-01 MED ORDER — HEMOSTATIC AGENTS (NO CHARGE) OPTIME
TOPICAL | Status: DC | PRN
  Administered 2017-03-01 (×2): 1 via TOPICAL

## 2017-03-01 MED ORDER — PROPOFOL 10 MG/ML IV BOLUS
INTRAVENOUS | Status: AC
  Filled 2017-03-01: qty 20

## 2017-03-01 MED ORDER — ONDANSETRON HCL 4 MG/2ML IJ SOLN
INTRAMUSCULAR | Status: AC
  Filled 2017-03-01: qty 2

## 2017-03-01 MED ORDER — PROPOFOL 1000 MG/100ML IV EMUL
INTRAVENOUS | Status: AC
  Filled 2017-03-01: qty 100

## 2017-03-01 MED ORDER — THROMBIN (RECOMBINANT) 20000 UNITS EX SOLR
CUTANEOUS | Status: DC | PRN
  Administered 2017-03-01 (×2): 20000 [IU] via TOPICAL

## 2017-03-01 MED ORDER — CYCLOBENZAPRINE HCL 10 MG PO TABS
ORAL_TABLET | ORAL | Status: AC
  Filled 2017-03-01: qty 1

## 2017-03-01 MED ORDER — LIDOCAINE-EPINEPHRINE 1 %-1:100000 IJ SOLN
INTRAMUSCULAR | Status: DC | PRN
  Administered 2017-03-01: 4 mL

## 2017-03-01 MED ORDER — TIZANIDINE HCL 4 MG PO TABS
4.0000 mg | ORAL_TABLET | Freq: Four times a day (QID) | ORAL | Status: DC | PRN
  Administered 2017-03-01 – 2017-03-02 (×2): 4 mg via ORAL
  Filled 2017-03-01 (×2): qty 1

## 2017-03-01 MED ORDER — DEXAMETHASONE SODIUM PHOSPHATE 10 MG/ML IJ SOLN
10.0000 mg | INTRAMUSCULAR | Status: DC
  Filled 2017-03-01: qty 1

## 2017-03-01 MED ORDER — PANTOPRAZOLE SODIUM 40 MG IV SOLR: 40.0000 mg | Freq: Every day | INTRAVENOUS | Status: DC

## 2017-03-01 MED ORDER — DEXAMETHASONE SODIUM PHOSPHATE 10 MG/ML IJ SOLN
INTRAMUSCULAR | Status: DC | PRN
  Administered 2017-03-01: 10 mg via INTRAVENOUS

## 2017-03-01 MED ORDER — OMEPRAZOLE 20 MG PO TBDD: 20.0000 mg | DELAYED_RELEASE_TABLET | Freq: Two times a day (BID) | ORAL | Status: DC | PRN

## 2017-03-01 MED ORDER — HYDROMORPHONE HCL 1 MG/ML IJ SOLN
INTRAMUSCULAR | Status: AC
  Filled 2017-03-01: qty 1

## 2017-03-01 MED ORDER — SUCCINYLCHOLINE CHLORIDE 200 MG/10ML IV SOSY
PREFILLED_SYRINGE | INTRAVENOUS | Status: DC | PRN
  Administered 2017-03-01: 140 mg via INTRAVENOUS

## 2017-03-01 MED ORDER — THROMBIN (RECOMBINANT) 20000 UNITS EX SOLR
CUTANEOUS | Status: AC
  Filled 2017-03-01: qty 20000

## 2017-03-01 MED ORDER — ALBUMIN HUMAN 5 % IV SOLN
INTRAVENOUS | Status: DC | PRN
  Administered 2017-03-01 (×2): via INTRAVENOUS

## 2017-03-01 MED ORDER — THROMBIN (RECOMBINANT) 5000 UNITS EX SOLR
CUTANEOUS | Status: AC
  Filled 2017-03-01: qty 5000

## 2017-03-01 MED ORDER — BUPIVACAINE HCL (PF) 0.25 % IJ SOLN
INTRAMUSCULAR | Status: AC
  Filled 2017-03-01: qty 30

## 2017-03-01 MED ORDER — ACETAMINOPHEN 325 MG PO TABS: 650.0000 mg | ORAL_TABLET | ORAL | Status: DC | PRN

## 2017-03-01 MED ORDER — PHENYLEPHRINE HCL 10 MG/ML IJ SOLN
INTRAMUSCULAR | Status: AC
  Filled 2017-03-01: qty 1

## 2017-03-01 MED ORDER — BUPIVACAINE LIPOSOME 1.3 % IJ SUSP
20.0000 mL | Freq: Once | INTRAMUSCULAR | Status: DC
  Filled 2017-03-01: qty 20

## 2017-03-01 MED ORDER — PHENOL 1.4 % MT LIQD: 1.0000 | OROMUCOSAL | Status: DC | PRN

## 2017-03-01 MED ORDER — PROBIOTIC PO CAPS: ORAL_CAPSULE | Freq: Every day | ORAL | Status: DC

## 2017-03-01 MED ORDER — TAPENTADOL HCL ER 50 MG PO TB12: 150.0000 mg | ORAL_TABLET | Freq: Two times a day (BID) | ORAL | Status: DC

## 2017-03-01 MED ORDER — PHENYLEPHRINE HCL 10 MG/ML IJ SOLN
INTRAVENOUS | Status: DC | PRN
  Administered 2017-03-01: 20 ug/min via INTRAVENOUS
  Administered 2017-03-01: 30 ug/min via INTRAVENOUS

## 2017-03-01 MED ORDER — BUPIVACAINE LIPOSOME 1.3 % IJ SUSP
INTRAMUSCULAR | Status: DC | PRN
  Administered 2017-03-01: 20 mL

## 2017-03-01 MED ORDER — CEFAZOLIN SODIUM 1 G IJ SOLR
INTRAMUSCULAR | Status: AC
  Filled 2017-03-01: qty 20

## 2017-03-01 MED ORDER — VANCOMYCIN HCL 1000 MG IV SOLR
INTRAVENOUS | Status: DC | PRN
  Administered 2017-03-01: 1000 mg

## 2017-03-01 MED ORDER — MENTHOL 3 MG MT LOZG: 1.0000 | LOZENGE | OROMUCOSAL | Status: DC | PRN

## 2017-03-01 MED ORDER — CEFAZOLIN SODIUM-DEXTROSE 2-4 GM/100ML-% IV SOLN
2.0000 g | Freq: Three times a day (TID) | INTRAVENOUS | Status: AC
  Administered 2017-03-02 (×2): 2 g via INTRAVENOUS
  Filled 2017-03-01 (×2): qty 100

## 2017-03-01 MED ORDER — HYDROMORPHONE HCL 1 MG/ML IJ SOLN: 0.2500 mg | INTRAMUSCULAR | Status: DC | PRN

## 2017-03-01 MED ORDER — MIDAZOLAM HCL 2 MG/2ML IJ SOLN
INTRAMUSCULAR | Status: AC
  Filled 2017-03-01: qty 2

## 2017-03-01 MED ORDER — ALUM & MAG HYDROXIDE-SIMETH 200-200-20 MG/5ML PO SUSP: 30.0000 mL | Freq: Four times a day (QID) | ORAL | Status: DC | PRN

## 2017-03-01 MED ORDER — HYDROMORPHONE HCL 1 MG/ML IJ SOLN
0.5000 mg | INTRAMUSCULAR | Status: DC | PRN
  Administered 2017-03-01: 0.5 mg via INTRAVENOUS
  Filled 2017-03-01: qty 0.5

## 2017-03-01 MED ORDER — ONDANSETRON HCL 4 MG/2ML IJ SOLN: 4.0000 mg | Freq: Four times a day (QID) | INTRAMUSCULAR | Status: DC | PRN

## 2017-03-01 MED ORDER — TAPENTADOL HCL 50 MG PO TABS
75.0000 mg | ORAL_TABLET | ORAL | Status: DC | PRN
  Administered 2017-03-01 – 2017-03-02 (×3): 75 mg via ORAL
  Filled 2017-03-01 (×3): qty 2

## 2017-03-01 MED ORDER — PANTOPRAZOLE SODIUM 40 MG PO TBEC: 40.0000 mg | DELAYED_RELEASE_TABLET | Freq: Two times a day (BID) | ORAL | Status: DC | PRN

## 2017-03-01 SURGICAL SUPPLY — 93 items
ADH SKN CLS APL DERMABOND .7 (GAUZE/BANDAGES/DRESSINGS) ×1
APL SKNCLS STERI-STRIP NONHPOA (GAUZE/BANDAGES/DRESSINGS) ×1
BAG DECANTER FOR FLEXI CONT (MISCELLANEOUS) ×2 IMPLANT
BENZOIN TINCTURE PRP APPL 2/3 (GAUZE/BANDAGES/DRESSINGS) ×2 IMPLANT
BIT DRILL 5.0/4.0 (BIT) IMPLANT
BLADE CLIPPER SURG (BLADE) IMPLANT
BLADE SURG 11 STRL SS (BLADE) ×2 IMPLANT
BONE VIVIGEN FORMABLE 5.4CC (Bone Implant) ×2 IMPLANT
BUR CUTTER 7.0 ROUND (BURR) ×2 IMPLANT
BUR MATCHSTICK NEURO 3.0 LAGG (BURR) ×2 IMPLANT
CAGE RISE 11-17-15 10X22 (Cage) ×2 IMPLANT
CANISTER SUCT 3000ML PPV (MISCELLANEOUS) ×2 IMPLANT
CAP LOCKING (Cap) ×6 IMPLANT
CAP LOCKING 5.5 CREO (Cap) IMPLANT
CARTRIDGE OIL MAESTRO DRILL (MISCELLANEOUS) ×1 IMPLANT
CONT SPEC 4OZ CLIKSEAL STRL BL (MISCELLANEOUS) ×2 IMPLANT
COVER BACK TABLE 60X90IN (DRAPES) ×2 IMPLANT
DECANTER SPIKE VIAL GLASS SM (MISCELLANEOUS) ×2 IMPLANT
DERMABOND ADVANCED (GAUZE/BANDAGES/DRESSINGS) ×1
DERMABOND ADVANCED .7 DNX12 (GAUZE/BANDAGES/DRESSINGS) ×1 IMPLANT
DIFFUSER DRILL AIR PNEUMATIC (MISCELLANEOUS) ×2 IMPLANT
DRAPE C-ARM 42X72 X-RAY (DRAPES) ×2 IMPLANT
DRAPE C-ARMOR (DRAPES) ×2 IMPLANT
DRAPE HALF SHEET 40X57 (DRAPES) ×1 IMPLANT
DRAPE LAPAROTOMY 100X72X124 (DRAPES) ×2 IMPLANT
DRAPE POUCH INSTRU U-SHP 10X18 (DRAPES) ×2 IMPLANT
DRAPE SURG 17X23 STRL (DRAPES) ×2 IMPLANT
DRILL 5.0/4.0 (BIT) ×2
DRSG OPSITE POSTOP 4X8 (GAUZE/BANDAGES/DRESSINGS) ×1 IMPLANT
DRSG TELFA 3X8 NADH (GAUZE/BANDAGES/DRESSINGS) ×2 IMPLANT
DURAPREP 26ML APPLICATOR (WOUND CARE) ×2 IMPLANT
ELECT BLADE 4.0 EZ CLEAN MEGAD (MISCELLANEOUS) ×2
ELECT REM PT RETURN 9FT ADLT (ELECTROSURGICAL) ×2
ELECTRODE BLDE 4.0 EZ CLN MEGD (MISCELLANEOUS) IMPLANT
ELECTRODE REM PT RTRN 9FT ADLT (ELECTROSURGICAL) ×1 IMPLANT
EVACUATOR 3/16  PVC DRAIN (DRAIN) ×1
EVACUATOR 3/16 PVC DRAIN (DRAIN) ×1 IMPLANT
FIBER CORTICAL ALLOFUSE 5CC (Bone Implant) ×1 IMPLANT
GAUZE SPONGE 4X4 12PLY STRL (GAUZE/BANDAGES/DRESSINGS) ×1 IMPLANT
GAUZE SPONGE 4X4 16PLY XRAY LF (GAUZE/BANDAGES/DRESSINGS) ×1 IMPLANT
GLOVE BIO SURGEON STRL SZ7 (GLOVE) ×2 IMPLANT
GLOVE BIO SURGEON STRL SZ8 (GLOVE) ×6 IMPLANT
GLOVE BIO SURGEON STRL SZ8.5 (GLOVE) ×2 IMPLANT
GLOVE BIOGEL PI IND STRL 7.0 (GLOVE) IMPLANT
GLOVE BIOGEL PI IND STRL 7.5 (GLOVE) IMPLANT
GLOVE BIOGEL PI INDICATOR 7.0 (GLOVE) ×7
GLOVE BIOGEL PI INDICATOR 7.5 (GLOVE) ×4
GLOVE ECLIPSE 7.5 STRL STRAW (GLOVE) ×2 IMPLANT
GLOVE EXAM NITRILE LRG STRL (GLOVE) IMPLANT
GLOVE EXAM NITRILE XL STR (GLOVE) IMPLANT
GLOVE EXAM NITRILE XS STR PU (GLOVE) IMPLANT
GLOVE INDICATOR 8.5 STRL (GLOVE) ×4 IMPLANT
GLOVE SURG SS PI 7.5 STRL IVOR (GLOVE) ×1 IMPLANT
GOWN STRL REUS W/ TWL LRG LVL3 (GOWN DISPOSABLE) IMPLANT
GOWN STRL REUS W/ TWL XL LVL3 (GOWN DISPOSABLE) ×2 IMPLANT
GOWN STRL REUS W/TWL 2XL LVL3 (GOWN DISPOSABLE) IMPLANT
GOWN STRL REUS W/TWL LRG LVL3 (GOWN DISPOSABLE) ×6
GOWN STRL REUS W/TWL XL LVL3 (GOWN DISPOSABLE) ×12
GRAFT BNE MATRIX VG FRMBL MD 5 (Bone Implant) IMPLANT
HEMOSTAT POWDER KIT SURGIFOAM (HEMOSTASIS) IMPLANT
KIT BASIN OR (CUSTOM PROCEDURE TRAY) ×2 IMPLANT
KIT ROOM TURNOVER OR (KITS) ×2 IMPLANT
MILL MEDIUM DISP (BLADE) ×2 IMPLANT
NDL HYPO 21X1.5 SAFETY (NEEDLE) ×1 IMPLANT
NDL HYPO 25X1 1.5 SAFETY (NEEDLE) ×1 IMPLANT
NEEDLE HYPO 21X1.5 SAFETY (NEEDLE) ×2 IMPLANT
NEEDLE HYPO 25X1 1.5 SAFETY (NEEDLE) ×2 IMPLANT
NS IRRIG 1000ML POUR BTL (IV SOLUTION) ×2 IMPLANT
OIL CARTRIDGE MAESTRO DRILL (MISCELLANEOUS) ×2
PACK LAMINECTOMY NEURO (CUSTOM PROCEDURE TRAY) ×2 IMPLANT
PAD ARMBOARD 7.5X6 YLW CONV (MISCELLANEOUS) ×6 IMPLANT
PAD DRESSING TELFA 3X8 NADH (GAUZE/BANDAGES/DRESSINGS) IMPLANT
PATTIES SURGICAL 1X1 (DISPOSABLE) ×1 IMPLANT
PUTTY DBM ALLOFUSE 5CC (Bone Implant) IMPLANT
ROD 70MM SPINAL (Rod) ×2 IMPLANT
SCREW MOD 6.0-5.0X35MM (Screw) ×2 IMPLANT
SCREW PREASSEMBLY 6.0-5.0X35 (Screw) ×2 IMPLANT
SHAFT CREO 30MM (Neuro Prosthesis/Implant) ×2 IMPLANT
SPACER RISE 10X22 8-14MM-10 (Spacer) ×2 IMPLANT
SPONGE LAP 4X18 X RAY DECT (DISPOSABLE) IMPLANT
SPONGE SURGIFOAM ABS GEL 100 (HEMOSTASIS) ×3 IMPLANT
STRIP CLOSURE SKIN 1/2X4 (GAUZE/BANDAGES/DRESSINGS) ×2 IMPLANT
SUT VIC AB 0 CT1 18XCR BRD8 (SUTURE) ×1 IMPLANT
SUT VIC AB 0 CT1 8-18 (SUTURE) ×2
SUT VIC AB 2-0 CT1 18 (SUTURE) ×3 IMPLANT
SUT VIC AB 4-0 PS2 27 (SUTURE) ×2 IMPLANT
SYR 20CC LL (SYRINGE) ×2 IMPLANT
SYR CONTROL 10ML LL (SYRINGE) ×1 IMPLANT
TOWEL GREEN STERILE (TOWEL DISPOSABLE) ×2 IMPLANT
TOWEL GREEN STERILE FF (TOWEL DISPOSABLE) ×2 IMPLANT
TRAY FOLEY W/METER SILVER 16FR (SET/KITS/TRAYS/PACK) ×2 IMPLANT
TULIP CREP AMP 5.5MM (Orthopedic Implant) ×6 IMPLANT
WATER STERILE IRR 1000ML POUR (IV SOLUTION) ×2 IMPLANT

## 2017-03-01 NOTE — Evaluation (Signed)
Physical Therapy Evaluation Patient Details Name: Necha Harries MRN: [id number] DOB: Dec 23, 1960 Today's Date: 03/01/2017   History of Present Illness  Pt is a 57 y/o female s/p L3-5. PMH includes asthma, melanoma, myasthenia gravis, and L TKA.   Clinical Impression  Patient is s/p above surgery resulting in the deficits listed below (see PT Problem List). PTA, pt was independent with functional mobility. Upon eval, pt presenting with post op pain and decreased balance. REquired min guard A and use of IV pole for ambulation. Reports she will have intermittent assist at home and will need DME below. Patient will benefit from skilled PT to increase their independence and safety with mobility (while adhering to their precautions) to allow discharge to the venue listed below. Will continue to follow acutely.      Follow Up Recommendations No PT follow up;Supervision for mobility/OOB    Equipment Recommendations  3in1 (PT)    Recommendations for Other Services       Precautions / Restrictions Precautions Precautions: Back Precaution Booklet Issued: Yes (comment) Precaution Comments: Reviewed back precaution handout with pt.  Required Braces or Orthoses: Spinal Brace Spinal Brace: Lumbar corset;Applied in sitting position Restrictions Weight Bearing Restrictions: No      Mobility  Bed Mobility               General bed mobility comments: Pt sitting up in bed upon entry secondary to discomfort. Educated about using log roll to get into and out of bed at home.   Transfers Overall transfer level: Needs assistance Equipment used: None Transfers: Sit to/from Stand Sit to Stand: Min guard         General transfer comment: Min guard for safety. Verbal cues to power through LEs.   Ambulation/Gait Ambulation/Gait assistance: Min guard Ambulation Distance (Feet): 300 Feet Assistive device: (IV pole ) Gait Pattern/deviations: Step-through pattern;Decreased stride  length Gait velocity: Decreased  Gait velocity interpretation: Below normal speed for age/gender General Gait Details: Slow, guarded gait. Slightly unsteady and required use of IV pole and min guard assist. Educated about generalized walking program to perform at home.   Stairs            Wheelchair Mobility    Modified Rankin (Stroke Patients Only)       Balance Overall balance assessment: Needs assistance Sitting-balance support: No upper extremity supported;Feet supported Sitting balance-Leahy Scale: Good     Standing balance support: No upper extremity supported;During functional activity;Single extremity supported Standing balance-Leahy Scale: Fair Standing balance comment: Able to maintain static standing without UE support                              Pertinent Vitals/Pain Pain Assessment: Faces Faces Pain Scale: Hurts even more Pain Location: back  Pain Descriptors / Indicators: Cramping;Aching;Operative site guarding Pain Intervention(s): Limited activity within patient's tolerance;Monitored during session;Repositioned    Home Living Family/patient expects to be discharged to:: Private residence Living Arrangements: Spouse/significant other Available Help at Discharge: Family;Available PRN/intermittently Type of Home: House Home Access: Stairs to enter Entrance Stairs-Rails: None Entrance Stairs-Number of Steps: 2 Home Layout: One level Home Equipment: Other (comment)(shower stool )      Prior Function Level of Independence: Independent               Hand Dominance        Extremity/Trunk Assessment   Upper Extremity Assessment Upper Extremity Assessment: Defer to OT evaluation    Lower  Extremity Assessment Lower Extremity Assessment: RLE deficits/detail;LLE deficits/detail RLE Deficits / Details: Reports numbness in RLE has improved, however, still feeling some numbness in thigh.  LLE Deficits / Details: Reports numbness in  LLE has improved.     Cervical / Trunk Assessment Cervical / Trunk Assessment: Other exceptions Cervical / Trunk Exceptions: s/p PLIF   Communication   Communication: No difficulties  Cognition Arousal/Alertness: Awake/alert Behavior During Therapy: WFL for tasks assessed/performed Overall Cognitive Status: Within Functional Limits for tasks assessed                                        General Comments      Exercises     Assessment/Plan    PT Assessment Patient needs continued PT services  PT Problem List Decreased strength;Decreased balance;Decreased mobility;Decreased knowledge of precautions;Decreased knowledge of use of DME;Pain       PT Treatment Interventions Gait training;Functional mobility training;Stair training;Therapeutic activities;Therapeutic exercise;Neuromuscular re-education;Balance training;Patient/family education    PT Goals (Current goals can be found in the Care Plan section)  Acute Rehab PT Goals Patient Stated Goal: to go home  PT Goal Formulation: With patient Time For Goal Achievement: 03/08/17 Potential to Achieve Goals: Good    Frequency Min 5X/week   Barriers to discharge Decreased caregiver support Reports intermittent assist at home     Co-evaluation               AM-PAC PT "6 Clicks" Daily Activity  Outcome Measure Difficulty turning over in bed (including adjusting bedclothes, sheets and blankets)?: A Little Difficulty moving from lying on back to sitting on the side of the bed? : A Little Difficulty sitting down on and standing up from a chair with arms (e.g., wheelchair, bedside commode, etc,.)?: Unable Help needed moving to and from a bed to chair (including a wheelchair)?: A Little Help needed walking in hospital room?: A Little Help needed climbing 3-5 steps with a railing? : A Little 6 Click Score: 16    End of Session Equipment Utilized During Treatment: Gait belt;Back brace Activity Tolerance:  Patient tolerated treatment well Patient left: in chair;with call bell/phone within reach Nurse Communication: Mobility status PT Visit Diagnosis: Unsteadiness on feet (R26.81);Other abnormalities of gait and mobility (R26.89);Pain Pain - part of body: (back )    Time: 1747-1810 PT Time Calculation (min) (ACUTE ONLY): 23 min   Charges:   PT Evaluation $PT Eval Low Complexity: 1 Low PT Treatments $Gait Training: 8-22 mins   PT G Codes:        Leighton Ruff, PT, DPT  Acute Rehabilitation Services  Pager: [phone]   Rudean Hitt 03/01/2017, 6:30 PM

## 2017-03-01 NOTE — Anesthesia Postprocedure Evaluation (Signed)
Anesthesia Post Note  Patient: [name]  Procedure(s) Performed: Posterior Lumbar Interbody Fusion Lumbar three-four, Lumbar four-five (N/A Back)     Patient location during evaluation: PACU Anesthesia Type: General Level of consciousness: awake and alert Pain management: pain level controlled Vital Signs Assessment: post-procedure vital signs reviewed and stable Respiratory status: spontaneous breathing, nonlabored ventilation, respiratory function stable and patient connected to nasal cannula oxygen Cardiovascular status: blood pressure returned to baseline and stable Postop Assessment: no apparent nausea or vomiting Anesthetic complications: no    Last Vitals:  Vitals:   03/01/17 1700 03/01/17 1725  BP: 113/65 118/81  Pulse: (!) 107 (!) 109  Resp: 11 17  Temp:  36.4 C  SpO2: 98% 96%    Last Pain:  Vitals:   03/01/17 1725  TempSrc: Oral  PainSc:                  Tiajuana Amass

## 2017-03-01 NOTE — Op Note (Signed)
Preoperative diagnosis: Grade 1 spondylolisthesis and severe spinal stenosis and instability L3-4 and severe spinal stenosis with instability L4-5 with bilateral L3-L4 and L5 radiculopathies  Postoperative diagnosis: Same  Procedure: #1 decompressive lumbar laminectomies L3-4 and L4-5 in excess and requiring more work to would be needed with a standard interbody fusion including complete medial facetectomies radical foraminotomies of the L3, L4, and L5 nerve roots bilaterally.  #2 posterior lumbar interbody fusion L3-4 L4-5 utilizing the globus rise expandable cage system packed with locally harvested autograft mixed with vivigen and cortical fibers  #3 cortical screw fixation L3-L5 utilizing the modular globus Creole cortical screw set was 60/50 by 35 mm screws at L3 and L5 and 30 at L4.  Surgeon: Dominica Severin Teodora Baumgarten  Asst.: Newman Pies  Anesthesia: Gen.  EBL: L  History of present illness: 57 year old female is a long same back and bilateral leg pain workup revealed instability with spondylolisthesis and motion of flexion extension L3-4 and L4-5. Due to patient's failed conservative treatment imaging findings and progressive clinical syndrome I recommended decompression stabilization procedure at those 2 levels. I extensively went over the risks and benefits of that operation with the patient as well as perioperative course expectations of outcome and alternatives surgery and she understood and agreed to proceed forward.  Operative procedure: Patient brought into the or was induced on general anesthesia positioned prone on the [facility] table her back was prepped and draped in routine sterile fashion after infiltration of 10 mL lidocaine with epi a midline incision was made and Bovie light cautery was used to calcification subperiosteal dissections care lamina of L3, L4, and L5 bilaterally. The facets at 3445 were markedly dystrophic and hypertrophic. Interoperative x-ray confirmed the location proper  level so I drilled down the facet joints at 3445 removed the spinous process at L3 and L4 performed central decompression. There was severe hourglass compression of thecal sac at L3-4 and also at L4-5 with marked foraminal stenosis. After completely facetectomies were performed and I went to the pars and completely decompressed the L3 and L4 nerve roots and then aggressively under bit the superior tickling facets at both levels. Then utilizing sequential distraction the disc space cleanout bilaterally with an 11 distractor in place I selected 10 the 17 expandable's for L4-5 and 814 expandable screw L3-4. After adequate discectomy and endplate preparation the left-sided cages were inserted then the endplates again were prepared discectomy was performed central area was packed with the local autograft mixed along with the vivigen and cortical fibers. Then contralateral cages were inserted evident was expanded equally however the right-sided L4-5 cage was positioned more laterally in the disc space and therefore was not able to be expanded as much as left side which was more medially directed and in the scalloping of the vertebral bodies. However all were all cages were expanded to have good apposition the endplates. Then to stick and cortical screw placement using a high-speed drill pilot holes were drilled under fluoroscopy cortical screw holes were selected and drilled in a slightly medial to lateral trajectory they were probed tapped probed again and 60/50 by 35 mm screws were inserted at L3 bilaterally 60/50 30 mm rec form L4 bilaterally and 6050 35 mm at L5 bilaterally. Then postop fluoroscopy AP and lateral confirmed good position I packed some more the cortical fibers laterally to the cages in the disc space all inspected all the foramina to confirm patency Gelfoam a tablet dura assembled the heads assembled the knots the rods aggravating place a  decompress L3 against L4 and L4 against L5. Then vancomycin  powder was speckled and the wound X Perella was injected in the fascia and the wounds closed in layers after placement of a medium large Hemovac drain with interrupted Vicryl running 4 subcuticular and skin Dermabond benzo and Steri-Strips and a sterile dressing applied. Patient recovered in stable condition. At the end the case on it counts sponge counts were correct.

## 2017-03-01 NOTE — Anesthesia Procedure Notes (Signed)
Procedure Name: Intubation Date/Time: 03/01/2017 11:48 AM Performed by: Colin Benton, CRNA Pre-anesthesia Checklist: Patient identified, Emergency Drugs available, Suction available and Patient being monitored Patient Re-evaluated:Patient Re-evaluated prior to induction Oxygen Delivery Method: Circle system utilized Preoxygenation: Pre-oxygenation with 100% oxygen Induction Type: IV induction Ventilation: Mask ventilation without difficulty Laryngoscope Size: Miller and 2 Grade View: Grade I Tube type: Oral Tube size: 7.0 mm Number of attempts: 2 Airway Equipment and Method: Stylet Placement Confirmation: ETT inserted through vocal cords under direct vision,  positive ETCO2 and breath sounds checked- equal and bilateral Secured at: 23 cm Tube secured with: Tape Dental Injury: Teeth and Oropharynx as per pre-operative assessment  Comments: DL x 1 by EMT.  No view.  DL x2 by CRNA and grade 1 view.  EBBS and VSS.

## 2017-03-01 NOTE — Transfer of Care (Signed)
Immediate Anesthesia Transfer of Care Note  Patient: [name]  Procedure(s) Performed: Posterior Lumbar Interbody Fusion Lumbar three-four, Lumbar four-five (N/A Back)  Patient Location: PACU  Anesthesia Type:General  Level of Consciousness: awake, alert  and oriented  Airway & Oxygen Therapy: Patient Spontanous Breathing and Patient connected to face mask oxygen  Post-op Assessment: Report given to RN and Post -op Vital signs reviewed and stable  Post vital signs: Reviewed and stable  Last Vitals:  Vitals:   03/01/17 1003  BP: 119/83  Pulse: 99  Temp: 36.6 C  SpO2: 99%    Last Pain:  Vitals:   03/01/17 1017  TempSrc:   PainSc: 7          Complications: No apparent anesthesia complications

## 2017-03-01 NOTE — H&P (Signed)
[name] is an 57 y.o. female.   Chief Complaint: Back and bilateral leg pain HPI: 57 year old female with long-standing back pain and bilateral hip and leg pain is been getting injections and physical therapy throughout the years with limited success workup has revealed progressive instability spinal stenosis at L3-4 and L4-5 with diastased this and the facets and fluid in the facets and a listhesis. Due to progressive clinical syndrome imaging findings of a conservative treatment I recommended laminectomy decompressive and interbody fusion L3-4 and L4-5. I've extensively reviewed the risks and benefits of the operation with the patient as well as perioperative course expectations of outcome alternatives of surgery and she understands and agrees to proceed forward.  Past Medical History:  Diagnosis Date  . Anemia   . Arthritis    back & knees   . Asthma    uses albuterol inhaler if outside, cold weather & exercising   . GERD (gastroesophageal reflux disease)   . History of hiatal hernia   . History of kidney stones    passed spontaneously  . Melanoma ([place])    malignant, left leg,  wide excision, sentinel node, one positive stage 3, as a result- lymphdema, will be following with ONCOLOGY, PET scan- last 2017  . Myasthenia gravis ([place])   . Pneumonia 2015   hosp. [facility] , treated with IV antibiotics   . Sleep apnea    sleep irregular pattern- no CPAP order solidified yet.     Past Surgical History:  Procedure Laterality Date  . APPENDECTOMY    . BREAST SURGERY Bilateral 2004   reduction   . EYE SURGERY Bilateral 1999   corneal peel  . MELANOMA EXCISION Left    removed from left leg  . OOPHORECTOMY Left   . THYMECTOMY  2001  . TOTAL KNEE ARTHROPLASTY Left 01/2016  . WRIST SURGERY Left    plate & 6 screws     Family History  Problem Relation Age of Onset  . COPD Mother   . Lung cancer Father   . Diverticulitis Father   . Colitis Neg Hx    Social History:   reports that  has never smoked. she has never used smokeless tobacco. She reports that she drinks about 0.5 oz of alcohol per week. She reports that she does not use drugs.  Allergies:  Allergies  Allergen Reactions  . Other Other (See Comments)    Cant take some "mycin" drugs   . Tape Other (See Comments)    Blister, irritates skin    Facility-Administered Medications Prior to Admission  Medication Dose Route Frequency Provider Last Rate Last Dose  . promethazine (PHENERGAN) injection 50 mg  50 mg Intramuscular Q6H PRN Robyn Haber, MD   50 mg at 07/11/12 1439   Medications Prior to Admission  Medication Sig Dispense Refill  . albuterol (PROVENTIL HFA;VENTOLIN HFA) 108 (90 Base) MCG/ACT inhaler Inhale 1 puff into the lungs every 6 (six) hours as needed for wheezing or shortness of breath.    . dexlansoprazole (DEXILANT) 60 MG capsule Take 1 capsule (60 mg total) by mouth daily. 90 capsule 1  . nabumetone (RELAFEN) 500 MG tablet Take 500 mg by mouth 2 (two) times daily as needed.     . Omeprazole 20 MG TBDD Take 20 mg by mouth 2 (two) times daily as needed (for acid reflux).     . pramipexole (MIRAPEX) 1.5 MG tablet Take 4.5 mg by mouth 3 (three) times daily as needed (for restless leg  syndrome).     . Probiotic Product (PROBIOTIC PO) Take 1 capsule by mouth daily.    . Tapentadol HCl (NUCYNTA ER) 150 MG TB12 Take 150 mg by mouth every 12 (twelve) hours.    . tapentadol HCl (NUCYNTA) 75 MG tablet Take 75 mg by mouth every 4 (four) hours as needed for severe pain.     . hyoscyamine (LEVSIN SL) 0.125 MG SL tablet Place 1 tablet (0.125 mg total) under the tongue every 4 (four) hours as needed for cramping. 270 tablet 0  . Na Sulfate-K Sulfate-Mg Sulf 17.5-3.13-1.6 GM/180ML SOLN Take 1 kit by mouth as directed. (Patient not taking: Reported on 02/15/2017) 354 mL 0    No results found for this or any previous visit (from the past 48 hour(s)). No results found.  Review of Systems   Musculoskeletal: Positive for back pain and myalgias.  Neurological: Positive for tingling and sensory change.    Blood pressure 119/83, pulse 99, temperature 97.8 F (36.6 C), temperature source Oral, height 5' 7.5" (1.715 m), weight 108 kg (238 lb), SpO2 99 %. Physical Exam  Constitutional: She is oriented to person, place, and time. She appears well-developed.  HENT:  Head: Normocephalic.  Eyes: Pupils are equal, round, and reactive to light.  Neck: Normal range of motion.  Respiratory: Effort normal.  GI: Soft.  Musculoskeletal: Normal range of motion.  Neurological: She is alert and oriented to person, place, and time. She has normal strength. GCS eye subscore is 4. GCS verbal subscore is 5. GCS motor subscore is 6.  Strength is 5 out of 5 iliopsoas, quads, hip she's, gastric, into tibialis, and EHL.  Skin: Skin is warm.     Assessment/Plan 57 year old patient with long-standing back and leg pain presents for interbody fusions at L3-4 L4-5.  Shine Scrogham P, MD 03/01/2017, 10:59 AM

## 2017-03-01 NOTE — Anesthesia Preprocedure Evaluation (Addendum)
Anesthesia Evaluation  Patient identified by MRN, date of birth, ID band Patient awake    Reviewed: Allergy & Precautions, NPO status , Patient's Chart, lab work & pertinent test results  Airway Mallampati: I  TM Distance: >3 FB Neck ROM: Full    Dental  (+) Teeth Intact, Dental Advidsory Given   Pulmonary sleep apnea ,    Pulmonary exam normal        Cardiovascular Normal cardiovascular exam     Neuro/Psych    GI/Hepatic GERD  Medicated and Controlled,  Endo/Other    Renal/GU      Musculoskeletal   Abdominal   Peds  Hematology   Anesthesia Other Findings   Reproductive/Obstetrics                            Anesthesia Physical Anesthesia Plan  ASA: III  Anesthesia Plan: General   Post-op Pain Management:    Induction: Intravenous  PONV Risk Score and Plan: 3 and Ondansetron, Dexamethasone and Treatment may vary due to age or medical condition  Airway Management Planned: Oral ETT  Additional Equipment:   Intra-op Plan:   Post-operative Plan: Extubation in OR  Informed Consent: I have reviewed the patients History and Physical, chart, labs and discussed the procedure including the risks, benefits and alternatives for the proposed anesthesia with the patient or authorized representative who has indicated his/her understanding and acceptance.     Plan Discussed with: CRNA and Surgeon  Anesthesia Plan Comments:         Anesthesia Quick Evaluation

## 2017-03-02 ENCOUNTER — Other Ambulatory Visit: Payer: Self-pay

## 2017-03-02 MED ORDER — HYDROCODONE-ACETAMINOPHEN 5-325 MG PO TABS: 1.0000 | ORAL_TABLET | Freq: Four times a day (QID) | ORAL | 0 refills | Status: DC | PRN

## 2017-03-02 MED ORDER — TIZANIDINE HCL 4 MG PO TABS: 4.0000 mg | ORAL_TABLET | Freq: Four times a day (QID) | ORAL | 0 refills | Status: DC | PRN

## 2017-03-02 MED ORDER — PANTOPRAZOLE SODIUM 40 MG PO TBEC: 40.0000 mg | DELAYED_RELEASE_TABLET | Freq: Two times a day (BID) | ORAL | Status: DC | PRN

## 2017-03-02 NOTE — Evaluation (Signed)
Physical Therapy Evaluation Patient Details Name: [name] MRN: [id number] DOB: [birth year] Today's Date: 03/02/2017   History of Present Illness  Pt is a 57 y/o female s/p L3-5. PMH includes asthma, melanoma, myasthenia gravis, and L TKA.   Clinical Impression  Pt admitted with above diagnosis. Pt currently with functional limitations due to the deficits listed below (see PT Problem List). At the time of PT eval pt was able to perform transfers and ambulation with gross min guard assist to supervision for safety. Pt completed stair training and was educated on precautions, brace application/wearing schedule, and safe progression of activity. She anticipates d/c home today. Pt will benefit from skilled PT to increase their independence and safety with mobility to allow discharge to the venue listed below.       Follow Up Recommendations No PT follow up;Supervision for mobility/OOB    Equipment Recommendations  3in1 (PT)    Recommendations for Other Services       Precautions / Restrictions Precautions Precautions: Back Precaution Booklet Issued: Yes (comment) Precaution Comments: Reviewed back precaution handout with pt.  Required Braces or Orthoses: Spinal Brace Spinal Brace: Lumbar corset;Applied in sitting position Restrictions Weight Bearing Restrictions: No      Mobility  Bed Mobility Overal bed mobility: Needs Assistance Bed Mobility: Rolling;Sidelying to Sit Rolling: Modified independent (Device/Increase time) Sidelying to sit: Supervision       General bed mobility comments: VC's for proper log roll technique.   Transfers Overall transfer level: Needs assistance Equipment used: None Transfers: Sit to/from Stand Sit to Stand: Supervision         General transfer comment: Increased time and appeared guarded due to pain. Pt demonstrated proper hand placement on seated surface for safety.   Ambulation/Gait Ambulation/Gait assistance: Min  guard Ambulation Distance (Feet): 300 Feet Assistive device: None Gait Pattern/deviations: Step-through pattern;Decreased stride length;Trunk flexed Gait velocity: Decreased Gait velocity interpretation: Below normal speed for age/gender General Gait Details: VC's for improved posture. Pt was able to ambulate without UE support with no obvious LOB.   Stairs Stairs: Yes Stairs assistance: Min guard Stair Management: Step to pattern;Forwards;One rail Left Number of Stairs: 2 General stair comments: VC's for sequencing and general safety. Pt negotiated stairs well with no unsteadiness noted.   Wheelchair Mobility    Modified Rankin (Stroke Patients Only)       Balance Overall balance assessment: Needs assistance Sitting-balance support: No upper extremity supported;Feet supported Sitting balance-Leahy Scale: Good     Standing balance support: No upper extremity supported;During functional activity;Single extremity supported Standing balance-Leahy Scale: Fair Standing balance comment: Able to maintain static standing without UE support                              Pertinent Vitals/Pain Pain Assessment: Faces Faces Pain Scale: Hurts little more Pain Location: back  Pain Descriptors / Indicators: Cramping;Aching;Operative site guarding Pain Intervention(s): Limited activity within patient's tolerance;Monitored during session;Repositioned    Home Living                        Prior Function                 Hand Dominance        Extremity/Trunk Assessment                Communication      Cognition Arousal/Alertness: Awake/alert Behavior During Therapy: WFL for tasks assessed/performed  Overall Cognitive Status: Within Functional Limits for tasks assessed                                        General Comments      Exercises     Assessment/Plan    PT Assessment    PT Problem List         PT Treatment  Interventions      PT Goals (Current goals can be found in the Care Plan section)  Acute Rehab PT Goals Patient Stated Goal: Home today PT Goal Formulation: With patient Time For Goal Achievement: 03/08/17 Potential to Achieve Goals: Good    Frequency Min 5X/week   Barriers to discharge        Co-evaluation               AM-PAC PT "6 Clicks" Daily Activity  Outcome Measure Difficulty turning over in bed (including adjusting bedclothes, sheets and blankets)?: None Difficulty moving from lying on back to sitting on the side of the bed? : A Little Difficulty sitting down on and standing up from a chair with arms (e.g., wheelchair, bedside commode, etc,.)?: A Little Help needed moving to and from a bed to chair (including a wheelchair)?: A Little Help needed walking in hospital room?: A Little Help needed climbing 3-5 steps with a railing? : A Little 6 Click Score: 19    End of Session Equipment Utilized During Treatment: Gait belt;Back brace Activity Tolerance: Patient tolerated treatment well Patient left: in chair;with call bell/phone within reach Nurse Communication: Mobility status PT Visit Diagnosis: Unsteadiness on feet (R26.81);Other abnormalities of gait and mobility (R26.89);Pain Pain - part of body: (back)    Time: 9326-7124 PT Time Calculation (min) (ACUTE ONLY): 20 min   Charges:     PT Treatments $Gait Training: 8-22 mins   PT G Codes:        Rolinda Roan, PT, DPT Acute Rehabilitation Services Pager: [place] 03/02/2017, 8:16 AM

## 2017-03-02 NOTE — Progress Notes (Signed)
Patient is discharged from room 3C06 at this time. Alert and in stable condition. IV site d/c'd and instructions read to patient and daughter with understanding verbalized. Left unit via wheelchair with all belongings at side. 

## 2017-03-02 NOTE — Discharge Summary (Signed)
Physician Discharge Summary  Patient ID: [name] MRN: [id number] DOB/AGE: [birth year] 57 y.o.  Admit date: 03/01/2017 Discharge date: 03/02/2017  Admission Diagnoses: Grade 1 spondylolisthesis and severe spinal stenosis and instability L3-4 and severe spinal stenosis with instability L4-5 with bilateral L3-L4 and L5 radiculopathies  Discharge Diagnoses: same as admitting   Discharged Condition: good  Hospital Course: The patient was admitted on 03/01/2017 and taken to the operating room where the patient underwent PLIF L3-4, 4-5. The patient tolerated the procedure well and was taken to the recovery room and then to the floor in stable condition. The hospital course was routine. There were no complications. The wound remained clean dry and intact. Pt had appropriate back soreness. No complaints of new pain or new N/T/W. The patient remained afebrile with stable vital signs, and tolerated a regular diet. The patient continued to increase activities, and pain was well controlled with oral pain medications.   Consults: None  Significant Diagnostic Studies:  Results for orders placed or performed during the hospital encounter of 02/23/17  Surgical pcr screen  Result Value Ref Range   MRSA, PCR NEGATIVE NEGATIVE   Staphylococcus aureus NEGATIVE NEGATIVE  CBC  Result Value Ref Range   WBC 7.0 4.0 - 10.5 K/uL   RBC 4.31 3.87 - 5.11 MIL/uL   Hemoglobin 12.0 12.0 - 15.0 g/dL   HCT 37.8 36.0 - 46.0 %   MCV 87.7 78.0 - 100.0 fL   MCH 27.8 26.0 - 34.0 pg   MCHC 31.7 30.0 - 36.0 g/dL   RDW 14.7 11.5 - 15.5 %   Platelets 294 150 - 400 K/uL  Basic metabolic panel  Result Value Ref Range   Sodium 138 135 - 145 mmol/L   Potassium 4.0 3.5 - 5.1 mmol/L   Chloride 102 101 - 111 mmol/L   CO2 30 22 - 32 mmol/L   Glucose, Bld 104 (H) 65 - 99 mg/dL   BUN 16 6 - 20 mg/dL   Creatinine, Ser 0.69 0.44 - 1.00 mg/dL   Calcium 8.9 8.9 - 10.3 mg/dL   GFR calc non Af Amer >60 >60 mL/min   GFR calc  Af Amer >60 >60 mL/min   Anion gap 6 5 - 15  Type and screen Jessie  Result Value Ref Range   ABO/RH(D) O NEG    Antibody Screen NEG    Sample Expiration 03/09/2017    Extend sample reason NO TRANSFUSIONS OR PREGNANCY IN THE PAST 3 MONTHS     Dg Lumbar Spine 2-3 Views  Result Date: 03/01/2017 CLINICAL DATA:  Posterior fusion EXAM: LUMBAR SPINE - 2-3 VIEW; DG C-ARM 61-120 MIN COMPARISON:  Lumbar CT postmyelogram October 20, 2015 FLUOROSCOPY TIME:  1 minutes 13 seconds; 2 acquired images FINDINGS: Frontal and lateral views were obtained. There are pedicle screws at L3, L4, and L5 bilaterally with disc spacers at L3-4 L4-5. No fracture or spondylolisthesis. Disc spaces appear normal. IMPRESSION: Pedicle screws bilaterally at L3, L4, and L5 with disc spacers at L3-4 and L4-5. No fracture or spondylolisthesis. Electronically Signed   By: Lowella Grip III M.D.   On: 03/01/2017 15:44   Dg C-arm 61-120 Min  Result Date: 03/01/2017 CLINICAL DATA:  Posterior fusion EXAM: LUMBAR SPINE - 2-3 VIEW; DG C-ARM 61-120 MIN COMPARISON:  Lumbar CT postmyelogram October 20, 2015 FLUOROSCOPY TIME:  1 minutes 13 seconds; 2 acquired images FINDINGS: Frontal and lateral views were obtained. There are pedicle screws at L3, L4, and L5 bilaterally  with disc spacers at L3-4 L4-5. No fracture or spondylolisthesis. Disc spaces appear normal. IMPRESSION: Pedicle screws bilaterally at L3, L4, and L5 with disc spacers at L3-4 and L4-5. No fracture or spondylolisthesis. Electronically Signed   By: Lowella Grip III M.D.   On: 03/01/2017 15:44    Antibiotics:  Anti-infectives (From admission, onward)   Start     Dose/Rate Route Frequency Ordered Stop   03/02/17 0000  ceFAZolin (ANCEF) IVPB 2g/100 mL premix     2 g 200 mL/hr over 30 Minutes Intravenous Every 8 hours 03/01/17 1724 03/02/17 0707   03/01/17 1519  vancomycin (VANCOCIN) powder  Status:  Discontinued       As needed 03/01/17 1519 03/01/17  1612   03/01/17 1228  bacitracin 50,000 Units in sodium chloride irrigation 0.9 % 500 mL irrigation  Status:  Discontinued       As needed 03/01/17 1228 03/01/17 1612   03/01/17 0959  ceFAZolin (ANCEF) IVPB 2g/100 mL premix     2 g 200 mL/hr over 30 Minutes Intravenous On call to O.R. 03/01/17 0959 03/01/17 1542      Discharge Exam: Blood pressure 127/80, pulse 77, temperature 98 F (36.7 C), temperature source Oral, resp. rate 20, height 5' 7.5" (1.715 m), weight 108 kg (238 lb), SpO2 100 %. Neurologic: Grossly normal Ambulating and voiding well  Discharge Medications:   Allergies as of 03/02/2017      Reactions   Other Other (See Comments)   Cant take some "mycin" drugs    Tape Other (See Comments)   Blister, irritates skin      Medication List    STOP taking these medications   Na Sulfate-K Sulfate-Mg Sulf 17.5-3.13-1.6 GM/177ML Soln     TAKE these medications   albuterol 108 (90 Base) MCG/ACT inhaler Commonly known as:  PROVENTIL HFA;VENTOLIN HFA Inhale 1 puff into the lungs every 6 (six) hours as needed for wheezing or shortness of breath.   dexlansoprazole 60 MG capsule Commonly known as:  DEXILANT Take 1 capsule (60 mg total) by mouth daily.   hyoscyamine 0.125 MG SL tablet Commonly known as:  LEVSIN SL Place 1 tablet (0.125 mg total) under the tongue every 4 (four) hours as needed for cramping.   nabumetone 500 MG tablet Commonly known as:  RELAFEN Take 500 mg by mouth 2 (two) times daily as needed.   NUCYNTA ER 150 MG Tb12 Generic drug:  Tapentadol HCl Take 150 mg by mouth every 12 (twelve) hours.   NUCYNTA 75 MG tablet Generic drug:  tapentadol HCl Take 75 mg by mouth every 4 (four) hours as needed for severe pain.   Omeprazole 20 MG Tbdd Take 20 mg by mouth 2 (two) times daily as needed (for acid reflux).   pramipexole 1.5 MG tablet Commonly known as:  MIRAPEX Take 4.5 mg by mouth 3 (three) times daily as needed (for restless leg syndrome).    PROBIOTIC PO Take 1 capsule by mouth daily.   tiZANidine 4 MG tablet Commonly known as:  ZANAFLEX Take 1 tablet (4 mg total) by mouth every 6 (six) hours as needed for muscle spasms (if unresponsive to cyclobenzaprine).            Durable Medical Equipment  (From admission, onward)        Start     Ordered   03/01/17 1808  For home use only DME 3 n 1  Once     03/01/17 1807      Disposition:  home   Final Dx: same as admitting  Discharge Instructions    Call MD for:  difficulty breathing, headache or visual disturbances   Complete by:  As directed    Call MD for:  extreme fatigue   Complete by:  As directed    Call MD for:  hives   Complete by:  As directed    Call MD for:  persistant dizziness or light-headedness   Complete by:  As directed    Call MD for:  persistant nausea and vomiting   Complete by:  As directed    Call MD for:  redness, tenderness, or signs of infection (pain, swelling, redness, odor or green/yellow discharge around incision site)   Complete by:  As directed    Call MD for:  severe uncontrolled pain   Complete by:  As directed    Call MD for:  temperature >100.4   Complete by:  As directed    Diet - low sodium heart healthy   Complete by:  As directed    Driving Restrictions   Complete by:  As directed    No driving 2 weeks   Increase activity slowly   Complete by:  As directed    Lifting restrictions   Complete by:  As directed    Nothing heavier than 8 lbs         Signed: Ocie Cornfield Aamira Bischoff 03/02/2017, 7:28 AM

## 2017-03-02 NOTE — Evaluation (Signed)
Occupational Therapy Evaluation and Discharge Patient Details Name: [name] MRN: [id number] DOB: September 22, 1960 Today's Date: 03/02/2017    History of Present Illness Pt is a 57 y/o female s/p L3-5 PLIF. PMH includes asthma, melanoma, myasthenia gravis, and L TKA.    Clinical Impression   Pt reports she was independent with ADL PTA. Currently pt supervision with ADL and functional mobility. All back, safety, and ADL education completed with pt. Pt planning to d/c home with supervision from family. No further acute OT needs identified; signing off at this time. Please re-consult if needs change. Thank you for this referral.     Follow Up Recommendations  No OT follow up;Supervision - Intermittent    Equipment Recommendations  None recommended by OT    Recommendations for Other Services       Precautions / Restrictions Precautions Precautions: Back Precaution Booklet Issued: No Precaution Comments: Reviewed back precaution handout with pt.  Required Braces or Orthoses: Spinal Brace Spinal Brace: Lumbar corset;Applied in sitting position Restrictions Weight Bearing Restrictions: No      Mobility Bed Mobility        General bed mobility comments: Pt OOB in chair upon arrival.  Transfers Overall transfer level: Needs assistance Equipment used: None Transfers: Sit to/from Stand Sit to Stand: Supervision         General transfer comment: for safety, good hand placement and technique    Balance Overall balance assessment: Needs assistance Sitting-balance support: Feet supported;No upper extremity supported Sitting balance-Leahy Scale: Good     Standing balance support: No upper extremity supported;During functional activity Standing balance-Leahy Scale: Good                            ADL either performed or assessed with clinical judgement   ADL Overall ADL's : Needs assistance/impaired Eating/Feeding: Independent;Sitting   Grooming:  Supervision/safety;Standing Grooming Details (indicate cue type and reason): Educated on use of 2 cups for oral care Upper Body Bathing: Set up;Sitting   Lower Body Bathing: Supervison/ safety;Sit to/from stand   Upper Body Dressing : Set up;Standing Upper Body Dressing Details (indicate cue type and reason): to doff/don brace. Educated on brace management and wear schedule. Lower Body Dressing: Supervision/safety;Sit to/from stand Lower Body Dressing Details (indicate cue type and reason): Pt unable to cross foot over opposite knee but able to bring up on chair without bending. Educated on compensatory strategies for LB ADL. Toilet Transfer: Supervision/safety;Ambulation;Regular Glass blower/designer Details (indicate cue type and reason): Pt able to manage toilet transfer with support on L side to simulate sink at home   Toileting - Clothing Manipulation Details (indicate cue type and reason): Educated on no twsiting for peri care and use of wet wipes Tub/ Shower Transfer: Supervision/safety;Tub transfer;Ambulation;Shower Scientist, research (medical) Details (indicate cue type and reason): Educated on proper technique for tub transfer; pt able to return demo. Discussed use of shower stool for safety with bathing Functional mobility during ADLs: Supervision/safety General ADL Comments: Educated pt on maintaining back precautions during functional activities, keeping frequently used items at counter top height, frequent mobility throughout the day upon return home.     Vision         Perception     Praxis      Pertinent Vitals/Pain Pain Assessment: Faces Faces Pain Scale: Hurts even more Pain Location: back Pain Descriptors / Indicators: Grimacing;Aching Pain Intervention(s): Monitored during session;Patient requesting pain meds-RN notified     Hand Dominance  Extremity/Trunk Assessment Upper Extremity Assessment Upper Extremity Assessment: Overall WFL for tasks assessed    Lower Extremity Assessment Lower Extremity Assessment: Defer to PT evaluation   Cervical / Trunk Assessment Cervical / Trunk Assessment: Other exceptions Cervical / Trunk Exceptions: s/p PLIF    Communication Communication Communication: No difficulties   Cognition Arousal/Alertness: Awake/alert Behavior During Therapy: WFL for tasks assessed/performed Overall Cognitive Status: Within Functional Limits for tasks assessed                                     General Comments       Exercises     Shoulder Instructions      Home Living Family/patient expects to be discharged to:: Private residence Living Arrangements: Spouse/significant other Available Help at Discharge: Family;Available PRN/intermittently Type of Home: House Home Access: Stairs to enter [employer] of Steps: 2 Entrance Stairs-Rails: None Home Layout: One level     Bathroom Shower/Tub: Corporate investment banker: Standard     Home Equipment: Shower seat          Prior Functioning/Environment Level of Independence: Independent                 OT Problem List:        OT Treatment/Interventions:      OT Goals(Current goals can be found in the care plan section) Acute Rehab OT Goals Patient Stated Goal: home today OT Goal Formulation: All assessment and education complete, DC therapy  OT Frequency:     Barriers to D/C:            Co-evaluation              AM-PAC PT "6 Clicks" Daily Activity     Outcome Measure Help from another person eating meals?: None Help from another person taking care of personal grooming?: A Little Help from another person toileting, which includes using toliet, bedpan, or urinal?: A Little Help from another person bathing (including washing, rinsing, drying)?: A Little Help from another person to put on and taking off regular upper body clothing?: A Little Help from another person to put on and taking off  regular lower body clothing?: A Little 6 Click Score: 19   End of Session Equipment Utilized During Treatment: Back brace Nurse Communication: Mobility status;Patient requests pain meds;Other (comment)(no equipment or f/u needs)  Activity Tolerance: Patient tolerated treatment well Patient left: in chair;with call bell/phone within reach;with family/visitor present  OT Visit Diagnosis: Unsteadiness on feet (R26.81);Pain Pain - part of body: (back)                Time: 0623-7628 OT Time Calculation (min): 12 min Charges:  OT General Charges $OT Visit: 1 Visit OT Evaluation $OT Eval Low Complexity: 1 Low G-Codes:     Philana Younis A. Ulice Brilliant, M.S., OTR/L Pager: [place] 03/02/2017, 9:06 AM

## 2017-03-02 NOTE — Discharge Instructions (Signed)

## 2017-03-08 MED FILL — Sodium Chloride IV Soln 0.9%: INTRAVENOUS | Qty: 1000 | Status: AC

## 2017-03-08 MED FILL — Heparin Sodium (Porcine) Inj 1000 Unit/ML: INTRAMUSCULAR | Qty: 30 | Status: AC

## 2017-04-14 ENCOUNTER — Telehealth: Payer: Self-pay

## 2017-04-14 NOTE — Telephone Encounter (Signed)
Erroneous encounter

## 2017-04-26 ENCOUNTER — Other Ambulatory Visit: Payer: Self-pay

## 2017-04-26 ENCOUNTER — Encounter (HOSPITAL_COMMUNITY): Payer: Self-pay

## 2017-04-26 ENCOUNTER — Observation Stay (HOSPITAL_COMMUNITY): Payer: BLUE CROSS/BLUE SHIELD

## 2017-04-26 ENCOUNTER — Observation Stay (HOSPITAL_COMMUNITY)
Admission: EM | Admit: 2017-04-26 | Discharge: 2017-04-27 | Disposition: A | Discharge status: Home / Self Care | Payer: BLUE CROSS/BLUE SHIELD | Attending: Family Medicine | Admitting: Family Medicine

## 2017-04-26 ENCOUNTER — Emergency Department (HOSPITAL_COMMUNITY): Payer: BLUE CROSS/BLUE SHIELD

## 2017-04-26 DIAGNOSIS — R0789 Other chest pain: Secondary | ICD-10-CM | POA: Insufficient documentation

## 2017-04-26 DIAGNOSIS — M159 Polyosteoarthritis, unspecified: Secondary | ICD-10-CM | POA: Diagnosis not present

## 2017-04-26 DIAGNOSIS — Z79899 Other long term (current) drug therapy: Secondary | ICD-10-CM | POA: Insufficient documentation

## 2017-04-26 DIAGNOSIS — G2581 Restless legs syndrome: Secondary | ICD-10-CM | POA: Insufficient documentation

## 2017-04-26 DIAGNOSIS — I10 Essential (primary) hypertension: Secondary | ICD-10-CM | POA: Diagnosis not present

## 2017-04-26 DIAGNOSIS — Z96652 Presence of left artificial knee joint: Secondary | ICD-10-CM | POA: Diagnosis not present

## 2017-04-26 DIAGNOSIS — C799 Secondary malignant neoplasm of unspecified site: Secondary | ICD-10-CM | POA: Diagnosis not present

## 2017-04-26 DIAGNOSIS — J45909 Unspecified asthma, uncomplicated: Secondary | ICD-10-CM | POA: Diagnosis not present

## 2017-04-26 DIAGNOSIS — K222 Esophageal obstruction: Secondary | ICD-10-CM

## 2017-04-26 DIAGNOSIS — R079 Chest pain, unspecified: Secondary | ICD-10-CM | POA: Diagnosis present

## 2017-04-26 DIAGNOSIS — Z85828 Personal history of other malignant neoplasm of skin: Secondary | ICD-10-CM | POA: Diagnosis not present

## 2017-04-26 DIAGNOSIS — K219 Gastro-esophageal reflux disease without esophagitis: Secondary | ICD-10-CM | POA: Diagnosis not present

## 2017-04-26 DIAGNOSIS — R109 Unspecified abdominal pain: Secondary | ICD-10-CM | POA: Diagnosis not present

## 2017-04-26 DIAGNOSIS — R0602 Shortness of breath: Secondary | ICD-10-CM | POA: Diagnosis not present

## 2017-04-26 LAB — BASIC METABOLIC PANEL
ANION GAP: 11 (ref 5–15)
BUN: 17 mg/dL (ref 6–20)
CHLORIDE: 102 mmol/L (ref 101–111)
CO2: 26 mmol/L (ref 22–32)
Calcium: 9.1 mg/dL (ref 8.9–10.3)
Creatinine, Ser: 0.71 mg/dL (ref 0.44–1.00)
GFR calc Af Amer: >60 mL/min (ref >60)
GLUCOSE: 109 mg/dL — AB (ref 65–99)
POTASSIUM: 3.8 mmol/L (ref 3.5–5.1)
Sodium: 139 mmol/L (ref 135–145)

## 2017-04-26 LAB — LIPID PANEL
CHOL/HDL RATIO: 3.9 ratio
CHOLESTEROL: 215 mg/dL — AB (ref 0–200)
HDL: 55 mg/dL (ref >40)
LDL Cholesterol: 129 mg/dL — ABNORMAL HIGH (ref 0–99)
Triglycerides: 153 mg/dL — ABNORMAL HIGH (ref <150)
VLDL: 31 mg/dL (ref 0–40)

## 2017-04-26 LAB — HEPATIC FUNCTION PANEL
ALBUMIN: 3.6 g/dL (ref 3.5–5.0)
ALK PHOS: 101 U/L (ref 38–126)
ALT: 19 U/L (ref 14–54)
AST: 22 U/L (ref 15–41)
BILIRUBIN TOTAL: 0.5 mg/dL (ref 0.3–1.2)
Bilirubin, Direct: <0.1 mg/dL — ABNORMAL LOW (ref 0.1–0.5)
Total Protein: 7.2 g/dL (ref 6.5–8.1)

## 2017-04-26 LAB — HEMOGLOBIN A1C
Hgb A1c MFr Bld: 5.8 % — ABNORMAL HIGH (ref 4.8–5.6)
MEAN PLASMA GLUCOSE: 119.76 mg/dL

## 2017-04-26 LAB — TROPONIN I
Troponin I: <0.03 ng/mL (ref <0.03)
Troponin I: <0.03 ng/mL (ref <0.03)

## 2017-04-26 LAB — I-STAT TROPONIN, ED: Troponin i, poc: 0 ng/mL (ref 0.00–0.08)

## 2017-04-26 LAB — CBC
HEMATOCRIT: 32 % — AB (ref 36.0–46.0)
HEMOGLOBIN: 10.1 g/dL — AB (ref 12.0–15.0)
MCH: 26.2 pg (ref 26.0–34.0)
MCHC: 31.6 g/dL (ref 30.0–36.0)
MCV: 82.9 fL (ref 78.0–100.0)
Platelets: 359 10*3/uL (ref 150–400)
RBC: 3.86 MIL/uL — ABNORMAL LOW (ref 3.87–5.11)
RDW: 15 % (ref 11.5–15.5)
WBC: 6.5 10*3/uL (ref 4.0–10.5)

## 2017-04-26 LAB — TSH: TSH: 1.528 u[IU]/mL (ref 0.350–4.500)

## 2017-04-26 LAB — LIPASE, BLOOD: LIPASE: 23 U/L (ref 11–51)

## 2017-04-26 LAB — BRAIN NATRIURETIC PEPTIDE: B Natriuretic Peptide: 15.5 pg/mL (ref 0.0–100.0)

## 2017-04-26 LAB — I-STAT BETA HCG BLOOD, ED (MC, WL, AP ONLY)

## 2017-04-26 MED ORDER — NITROGLYCERIN 0.4 MG SL SUBL: 0.4000 mg | SUBLINGUAL_TABLET | SUBLINGUAL | Status: DC | PRN

## 2017-04-26 MED ORDER — ACETAMINOPHEN 325 MG PO TABS
650.0000 mg | ORAL_TABLET | Freq: Four times a day (QID) | ORAL | Status: DC | PRN
  Administered 2017-04-27: 650 mg via ORAL
  Filled 2017-04-26: qty 2

## 2017-04-26 MED ORDER — GABAPENTIN 300 MG PO CAPS: 300.0000 mg | ORAL_CAPSULE | Freq: Every day | ORAL | Status: DC

## 2017-04-26 MED ORDER — ZOLPIDEM TARTRATE 5 MG PO TABS: 5.0000 mg | ORAL_TABLET | Freq: Every evening | ORAL | Status: DC | PRN

## 2017-04-26 MED ORDER — ALPRAZOLAM 0.5 MG PO TABS
2.0000 mg | ORAL_TABLET | Freq: Once | ORAL | Status: AC
  Administered 2017-04-26: 2 mg via ORAL
  Filled 2017-04-26: qty 4

## 2017-04-26 MED ORDER — IOPAMIDOL (ISOVUE-370) INJECTION 76%
100.0000 mL | Freq: Once | INTRAVENOUS | Status: AC | PRN
  Administered 2017-04-26: 100 mL via INTRAVENOUS

## 2017-04-26 MED ORDER — NITROGLYCERIN 2 % TD OINT
1.0000 [in_us] | TOPICAL_OINTMENT | Freq: Once | TRANSDERMAL | Status: AC
  Administered 2017-04-26: 1 [in_us] via TOPICAL
  Filled 2017-04-26: qty 1

## 2017-04-26 MED ORDER — ACETAMINOPHEN 650 MG RE SUPP: 650.0000 mg | Freq: Four times a day (QID) | RECTAL | Status: DC | PRN

## 2017-04-26 MED ORDER — PANTOPRAZOLE SODIUM 40 MG IV SOLR
40.0000 mg | Freq: Two times a day (BID) | INTRAVENOUS | Status: DC
  Administered 2017-04-26 (×2): 40 mg via INTRAVENOUS
  Filled 2017-04-26 (×2): qty 40

## 2017-04-26 MED ORDER — SODIUM CHLORIDE 0.9 % IV SOLN
INTRAVENOUS | Status: DC
  Administered 2017-04-26 – 2017-04-27 (×2): via INTRAVENOUS

## 2017-04-26 MED ORDER — BISACODYL 5 MG PO TBEC: 5.0000 mg | DELAYED_RELEASE_TABLET | Freq: Every day | ORAL | Status: DC | PRN

## 2017-04-26 MED ORDER — ALBUTEROL SULFATE (2.5 MG/3ML) 0.083% IN NEBU: 3.0000 mL | INHALATION_SOLUTION | Freq: Four times a day (QID) | RESPIRATORY_TRACT | Status: DC | PRN

## 2017-04-26 MED ORDER — OXYCODONE HCL 5 MG PO TABS
10.0000 mg | ORAL_TABLET | ORAL | Status: AC
  Administered 2017-04-26: 10 mg via ORAL
  Filled 2017-04-26: qty 2

## 2017-04-26 MED ORDER — ONDANSETRON HCL 4 MG/2ML IJ SOLN
4.0000 mg | Freq: Four times a day (QID) | INTRAMUSCULAR | Status: DC | PRN
  Administered 2017-04-26 – 2017-04-27 (×3): 4 mg via INTRAVENOUS
  Filled 2017-04-26 (×3): qty 2

## 2017-04-26 MED ORDER — TIZANIDINE HCL 4 MG PO TABS
4.0000 mg | ORAL_TABLET | Freq: Four times a day (QID) | ORAL | Status: DC | PRN
  Administered 2017-04-26: 4 mg via ORAL
  Filled 2017-04-26: qty 1

## 2017-04-26 MED ORDER — NABUMETONE 500 MG PO TABS
500.0000 mg | ORAL_TABLET | Freq: Two times a day (BID) | ORAL | Status: DC | PRN
  Filled 2017-04-26 (×2): qty 1

## 2017-04-26 MED ORDER — ONDANSETRON HCL 4 MG PO TABS: 4.0000 mg | ORAL_TABLET | Freq: Four times a day (QID) | ORAL | Status: DC | PRN

## 2017-04-26 MED ORDER — PRAMIPEXOLE DIHYDROCHLORIDE 1 MG PO TABS
1.5000 mg | ORAL_TABLET | Freq: Three times a day (TID) | ORAL | Status: DC | PRN
  Administered 2017-04-26: 1.5 mg via ORAL
  Filled 2017-04-26: qty 2

## 2017-04-26 MED ORDER — LORAZEPAM 2 MG/ML IJ SOLN
1.0000 mg | Freq: Once | INTRAMUSCULAR | Status: AC
  Administered 2017-04-26: 1 mg via INTRAVENOUS
  Filled 2017-04-26: qty 1

## 2017-04-26 MED ORDER — TAPENTADOL HCL 75 MG PO TABS
75.0000 mg | ORAL_TABLET | ORAL | Status: DC | PRN
  Administered 2017-04-26 – 2017-04-27 (×3): 75 mg via ORAL
  Filled 2017-04-26 (×4): qty 1

## 2017-04-26 MED ORDER — SODIUM CHLORIDE 0.9 % IJ SOLN
INTRAMUSCULAR | Status: AC
  Filled 2017-04-26: qty 100

## 2017-04-26 MED ORDER — ASPIRIN 81 MG PO CHEW
324.0000 mg | CHEWABLE_TABLET | Freq: Once | ORAL | Status: AC
  Administered 2017-04-26: 324 mg via ORAL
  Filled 2017-04-26: qty 4

## 2017-04-26 MED ORDER — GABAPENTIN 300 MG PO CAPS
300.0000 mg | ORAL_CAPSULE | Freq: Three times a day (TID) | ORAL | Status: DC
  Administered 2017-04-26 – 2017-04-27 (×4): 300 mg via ORAL
  Filled 2017-04-26 (×4): qty 1

## 2017-04-26 MED ORDER — PROCHLORPERAZINE EDISYLATE 5 MG/ML IJ SOLN
5.0000 mg | Freq: Once | INTRAMUSCULAR | Status: AC
  Administered 2017-04-26: 5 mg via INTRAVENOUS
  Filled 2017-04-26: qty 2

## 2017-04-26 MED ORDER — SENNOSIDES-DOCUSATE SODIUM 8.6-50 MG PO TABS: 1.0000 | ORAL_TABLET | Freq: Every evening | ORAL | Status: DC | PRN

## 2017-04-26 MED ORDER — IOPAMIDOL (ISOVUE-370) INJECTION 76%
INTRAVENOUS | Status: AC
  Administered 2017-04-26: 100 mL via INTRAVENOUS
  Filled 2017-04-26: qty 100

## 2017-04-26 NOTE — ED Notes (Signed)
Attempted to get blood two times, but was only able to get one tube.

## 2017-04-26 NOTE — Consult Note (Addendum)
The patient has been seen in conjunction with Daune Perch, NP. All aspects of care have been considered and discussed. The patient has been personally interviewed, examined, and all clinical data has been reviewed.   Chest pain/pressure radiating into the right shoulder and back.  Difficult to obtain history because patient has writhing about in bed and also has restless leg syndrome.  Discomfort is also present in the lower chest region and right upper quadrant.  Obese, elevated blood pressure 145/95 mmHg, sinus tachycardia, patent/palpable carotid, radial, and dorsalis pedis pulses bilaterally.  Cardiac exam reveals no rub or significant murmur.  Lungs are clear.  There is no peripheral edema  Chest x-ray reveals mild cardiomegaly.  No mention of mediastinal widening.  ECG from 6:54 AM reveals sinus tachycardia, low voltage, and no ischemic changes.  Multiple tracings have shown no ischemic changes only variation in heart rate with one tracing showing sinus tachycardia.  Troponin x2 is negative.  I do not believe the patient has an acute coronary syndrome.  Cannot totally exclude the possibility of aortic dissection although no strong clinical indicators with equal pulses.  Recommend CT angios of the aorta to exclude dissection.  Continue to cycle cardiac markers.  Critical care time - 1 hour spent assessing all clinical data, examining the patient, and coordinating emergency care.  Cardiology Consultation:   Patient ID: [name]; [id number]; 1960/05/31   Admit date: 04/26/2017 Date of Consult: 04/26/2017  Primary Care Provider: Patient, No Pcp Per Primary Cardiologist: No primary care provider on file.   Patient Profile:   [name] is a 57 y.o. female with a hx of metastatic melanoma stage III, asthma, GERD, RLS, obesity who is being seen today for the evaluation of chest pain at the request of Dr. Reesa Chew.  History of Present Illness:   Ms. Gatz has no  previous cardiac history. She presented to the [facility] ED early this morning for complaints of chest pain. She has a history of GERD by endoscopy in 12/2016 with esophageal stricture, larger hiatal hernia and fundic polyp. She had slightly elevated BP, 148/124 and tachycardia and was very anxious on presentation. EKG was without acute ischemic changes and first troponin was negative.Hgb down to 10.1 from 12.0 in 01/2017.  Labs otherwise unremarkable. CXR showed mild cardiomegaly with possible mild vascular congestion. BNP 15.5. She was given 1 inch nitropaste and Ativan.   The patient is difficult to assess. Upon my entering the room she is sitting on the foot of the bed rocking back and forth and moaning. She is restless and constantly moving and at the same time very sleepy and has to be repeatedly redirected in answering questions. Her husband says that her restless legs are really bothering her and now she is sedated with Xanax. The patient is hard to pin down on the history of her chest pain, but she says that her pain started yesterday morning while lying in bed as chest feeling like in a vice grip with shortness of breath. It is mid chest and radiates to the posterior right shoulder. She is unable to tell me if it is constant or intermittent. It is unlike her previous reflux symptoms. Her husband says that about 2 am this morning her legs were driving her crazy and she complained of chest pain and shortness of breath. She took a hot bath without relief and decided to come to the hospital. Prior to yesterday she was having no exertional chest discomfort. She says that she  has been short of breath going up hills for about 2 weeks, but her husband says this is the first he has heard of this.   The patient had back surgery in early January and has had extremely restless legs since then. She was prescribed a medication a couple of weeks ago but she did not take it yesterday as she had run out. She denies  ever smoking and drinks small amount of alcohol socially about once a week.   Past Medical History:  Diagnosis Date  . Anemia   . Arthritis    back & knees   . Asthma    uses albuterol inhaler if outside, cold weather & exercising   . GERD (gastroesophageal reflux disease)   . History of hiatal hernia   . History of kidney stones    passed spontaneously  . Melanoma ([place])    malignant, left leg,  wide excision, sentinel node, one positive stage 3, as a result- lymphdema, will be following with ONCOLOGY, PET scan- last 2017  . Myasthenia gravis ([place])   . Pneumonia 2015   hosp. [facility] , treated with IV antibiotics   . Sleep apnea    sleep irregular pattern- no CPAP order solidified yet.     Past Surgical History:  Procedure Laterality Date  . APPENDECTOMY    . BREAST SURGERY Bilateral 2004   reduction   . EYE SURGERY Bilateral 1999   corneal peel  . MELANOMA EXCISION Left    removed from left leg  . OOPHORECTOMY Left   . THYMECTOMY  2001  . TOTAL KNEE ARTHROPLASTY Left 01/2016  . WRIST SURGERY Left    plate & 6 screws      Home Medications:  Prior to Admission medications   Medication Sig Start Date End Date Taking? Authorizing Provider  albuterol (PROVENTIL HFA;VENTOLIN HFA) 108 (90 Base) MCG/ACT inhaler Inhale 1 puff into the lungs every 6 (six) hours as needed for wheezing or shortness of breath.   Yes [provider]  dexlansoprazole (DEXILANT) 60 MG capsule Take 1 capsule (60 mg total) by mouth daily. 02/13/17  Yes Levin Erp, PA  gabapentin (NEURONTIN) 300 MG capsule Take 300 mg by mouth at bedtime.   Yes [provider]  nabumetone (RELAFEN) 500 MG tablet Take 500 mg by mouth 2 (two) times daily as needed.    Yes [provider]  Omeprazole 20 MG TBDD Take 20 mg by mouth 2 (two) times daily as needed (for acid reflux).    Yes [provider]  pramipexole (MIRAPEX) 1.5 MG tablet Take 4.5 mg by mouth 3 (three) times  daily as needed (for restless leg syndrome).    Yes [provider]  tapentadol HCl (NUCYNTA) 75 MG tablet Take 75 mg by mouth every 4 (four) hours as needed for severe pain.    Yes [provider]  tiZANidine (ZANAFLEX) 4 MG tablet Take 1 tablet (4 mg total) by mouth every 6 (six) hours as needed for muscle spasms (if unresponsive to cyclobenzaprine). Patient taking differently: Take 4-6 mg by mouth every 6 (six) hours as needed for muscle spasms (if unresponsive to cyclobenzaprine).  03/02/17  Yes Meyran, Ocie Cornfield, NP  zolpidem (AMBIEN CR) 12.5 MG CR tablet Take 12.5 mg by mouth at bedtime as needed for sleep.   Yes [provider]  HYDROcodone-acetaminophen (NORCO/VICODIN) 5-325 MG tablet Take 1 tablet by mouth every 6 (six) hours as needed for moderate pain. Patient not taking: Reported on  04/26/2017 03/02/17 03/02/18  Meyran, Ocie Cornfield, NP  hyoscyamine (LEVSIN SL) 0.125 MG SL tablet Place 1 tablet (0.125 mg total) under the tongue every 4 (four) hours as needed for cramping. Patient not taking: Reported on 04/26/2017 02/22/17   Irene Shipper, MD    Inpatient Medications: Scheduled Meds: . gabapentin  300 mg Oral TID  . pantoprazole (PROTONIX) IV  40 mg Intravenous Q12H   Continuous Infusions: . sodium chloride     PRN Meds: acetaminophen **OR** acetaminophen, albuterol, bisacodyl, nabumetone, ondansetron **OR** ondansetron (ZOFRAN) IV, pramipexole, senna-docusate, tapentadol HCl, tiZANidine, zolpidem  Allergies:    Allergies  Allergen Reactions  . Other Other (See Comments)    Cant take some "mycin" drugs   . Tape Other (See Comments)    Blister, irritates skin    Social History:   Social History   Socioeconomic History  . Marital status: Married    Spouse name: Not on file  . Number of children: 2  . Years of education: Not on file  . Highest education level: Not on file  Social Needs  . Financial resource strain: Not on file  . Food  insecurity - worry: Not on file  . Food insecurity - inability: Not on file  . Transportation needs - medical: Not on file  . Transportation needs - non-medical: Not on file  Occupational History  . Occupation: clinical research  Tobacco Use  . Smoking status: Never Smoker  . Smokeless tobacco: Never Used  Substance and Sexual Activity  . Alcohol use: Yes    Alcohol/week: 0.5 oz    Types: 1 Standard drinks or equivalent per week    Comment: occassionally  . Drug use: No  . Sexual activity: Yes    Birth control/protection: None  Other Topics Concern  . Not on file  Social History Narrative   ** Merged History Encounter **        Family History:    Family History  Problem Relation Age of Onset  . COPD Mother   . Lung cancer Father   . Diverticulitis Father   . Colitis Neg Hx      ROS:  Please see the history of present illness.   All other ROS reviewed and negative.     Physical Exam/Data:   Vitals:   04/26/17 0718 04/26/17 0736 04/26/17 0851 04/26/17 0900  BP:  (!) 156/88 (!) 142/125 (!) 146/94  Pulse: (!) 110 99 (!) 107 98  Resp: 20 14    Temp: 98.1 F (36.7 C)     TempSrc: Oral     SpO2: 99% 98% 99% 96%   No intake or output data in the 24 hours ending 04/26/17 0922 There were no vitals filed for this visit. There is no height or weight on file to calculate BMI.  General:  Obese female, well developed, in no acute distress HEENT: normal Lymph: no adenopathy Neck: no JVD Endocrine:  No thryomegaly Vascular: No carotid bruits; FA pulses 2+ bilaterally without bruits  Cardiac:  normal S1, S2; RRR; no murmur  Lungs:  clear to auscultation bilaterally, no wheezing, rhonchi or rales  Abd: soft, nontender, no hepatomegaly  Ext: no edema Musculoskeletal:  No deformities, BUE and BLE strength normal and equal Skin: warm and dry  Neuro:  Pt sleepy, yet agitated, restless, rocking in the bed and intermittently standing up for relief Psych:  As above  EKG:   The EKG was personally reviewed and demonstrates:  Sinus tachycardia 107 bpm, low  voltage, no ischemic changes. Follow up EKG SR 90 bpm, no changes Telemetry:  Telemetry was personally reviewed and demonstrates:  Sinus tach 100's-110's  Relevant CV Studies:  None  Laboratory Data:  Chemistry Recent Labs  Lab 04/26/17 0637  NA 139  K 3.8  CL 102  CO2 26  GLUCOSE 109*  BUN 17  CREATININE 0.71  CALCIUM 9.1  GFRNONAA >60  GFRAA >60  ANIONGAP 11    No results for input(s): PROT, ALBUMIN, AST, ALT, ALKPHOS, BILITOT in the last 168 hours. Hematology Recent Labs  Lab 04/26/17 0547  WBC 6.5  RBC 3.86*  HGB 10.1*  HCT 32.0*  MCV 82.9  MCH 26.2  MCHC 31.6  RDW 15.0  PLT 359   Cardiac EnzymesNo results for input(s): TROPONINI in the last 168 hours.  Recent Labs  Lab 04/26/17 0653  TROPIPOC 0.00    BNP Recent Labs  Lab 04/26/17 0637  BNP 15.5    DDimer No results for input(s): DDIMER in the last 168 hours.  Radiology/Studies:  Dg Chest 2 View  Result Date: 04/26/2017 CLINICAL DATA:  57 year old female with chest pain. EXAM: CHEST  2 VIEW COMPARISON:  Chest radiograph dated 02/22/2013 FINDINGS: There is shallow inspiration with subsegmental atelectatic changes. Mild diffuse interstitial and central vascular prominence may be related to atelectatic changes or mild congestion. There is no focal consolidation, pleural effusion, or pneumothorax. Borderline cardiomegaly. Median sternotomy wires. No acute osseous pathology. IMPRESSION: Mild cardiomegaly with possible mild vascular congestion. No focal consolidation. Electronically Signed   By: Anner Crete M.D.   On: 04/26/2017 05:25    Assessment and Plan:   Chest pain -No previous cardiac history -Pt difficult to get a good history from. Apparently developed chest pain like a vice yesterday morning while in bed. This has persisted, radiating to the posterior right shoulder and with associated shortness of breath.    -Troponins negative X2, 0.00. BNP not elevated. CXR with  mild cardiomegaly with possible mild vascular congestion. -No ischemic changes on EKG -No objective evidence of myocardial ischemia. With patient's degree of discomfort, would expect to see elevation in troponin.  -She is having great discomfort with restless legs and is sedated with Xanax but still very restless, unable to sit still. --Would continue to watch and look into other sources of her chest pain as well. Consider ruling out PE. Pt is tachycardic, which could be related to anxiety. Also pt still has her gallbladder.  -Dr. Tamala Julian to see  Pt.   GERD -endoscopy in 12/2016 with esophageal stricture, larger hiatal hernia and fundic polyp. -Takes Dexilant 60 mg daily and omeprazole as needed for reflux and hyoscyamine SL as needed for cramping.  -States current symptoms are not like her usual GERD symptoms.  Restless leg syndrome -Pt has had worse symptoms since back surgery in January. Seems to be causing her a lot of discomfort at present.  For questions or updates, please contact [facility] Please consult [email or web address] for contact info under Cardiology/STEMI.   Signed, Daune Perch, NP  04/26/2017 9:22 AM

## 2017-04-26 NOTE — ED Provider Notes (Signed)
[facility] DEPT Provider Note   CSN: [id number] Arrival date & time: 04/26/17  0423     History   Chief Complaint Chief Complaint  Patient presents with  . Chest Pain    HPI [name] is a 57 y.o. female.  The history is provided by the patient and the spouse.  Chest Pain   This is a new problem. The current episode started 6 to 12 hours ago. The problem occurs constantly. The problem has been gradually worsening. The pain is present in the substernal region. The pain is severe. The quality of the pain is described as pressure-like. The pain radiates to the right shoulder. Associated symptoms include diaphoresis and nausea. Pertinent negatives include no weakness. She has tried nothing for the symptoms. Risk factors include obesity.  Patient with history of anemia and GERD presents with chest pain.  Reports onset of chest pressure shortness of breath and diaphoresis several hours ago.  It is not improving.  She also reports that she is having a restless leg syndrome attack she feels very anxious. No history of CAD is reported  Past Medical History:  Diagnosis Date  . Anemia   . Arthritis    back & knees   . Asthma    uses albuterol inhaler if outside, cold weather & exercising   . GERD (gastroesophageal reflux disease)   . History of hiatal hernia   . History of kidney stones    passed spontaneously  . Melanoma ([place])    malignant, left leg,  wide excision, sentinel node, one positive stage 3, as a result- lymphdema, will be following with ONCOLOGY, PET scan- last 2017  . Myasthenia gravis ([place])   . Pneumonia 2015   hosp. [facility] , treated with IV antibiotics   . Sleep apnea    sleep irregular pattern- no CPAP order solidified yet.     Patient Active Problem List   Diagnosis Date Noted  . Spondylolisthesis at L3-L4 level 03/01/2017  . CERVICAL RADICULOPATHY, LEFT 06/04/2008  . MEDIAL EPICONDYLITIS 06/04/2008  . ABDOMINAL PAIN,  GENERALIZED 05/16/2008  . ANEMIA-NOS 03/25/2008  . ALLERGIC RHINITIS 03/25/2008  . ASTHMA 03/25/2008  . FATIGUE 03/25/2008  . B12 DEFICIENCY 06/04/2007  . RESTLESS LEG SYNDROME 06/04/2007  . MYASTHENIA GRAVIS WITHOUT EXACERBATION 06/04/2007  . GERD 06/04/2007  . IRRITABLE BOWEL SYNDROME 06/04/2007    Past Surgical History:  Procedure Laterality Date  . APPENDECTOMY    . BREAST SURGERY Bilateral 2004   reduction   . EYE SURGERY Bilateral 1999   corneal peel  . MELANOMA EXCISION Left    removed from left leg  . OOPHORECTOMY Left   . THYMECTOMY  2001  . TOTAL KNEE ARTHROPLASTY Left 01/2016  . WRIST SURGERY Left    plate & 6 screws     OB History    No data available       Home Medications    Prior to Admission medications   Medication Sig Start Date End Date Taking? Authorizing Provider  albuterol (PROVENTIL HFA;VENTOLIN HFA) 108 (90 Base) MCG/ACT inhaler Inhale 1 puff into the lungs every 6 (six) hours as needed for wheezing or shortness of breath.   Yes [provider]  dexlansoprazole (DEXILANT) 60 MG capsule Take 1 capsule (60 mg total) by mouth daily. 02/13/17  Yes Levin Erp, PA  gabapentin (NEURONTIN) 300 MG capsule Take 300 mg by mouth at bedtime.   Yes [provider]  nabumetone (RELAFEN) 500 MG tablet  Take 500 mg by mouth 2 (two) times daily as needed.    Yes [provider]  Omeprazole 20 MG TBDD Take 20 mg by mouth 2 (two) times daily as needed (for acid reflux).    Yes [provider]  pramipexole (MIRAPEX) 1.5 MG tablet Take 4.5 mg by mouth 3 (three) times daily as needed (for restless leg syndrome).    Yes [provider]  tapentadol HCl (NUCYNTA) 75 MG tablet Take 75 mg by mouth every 4 (four) hours as needed for severe pain.    Yes [provider]  tiZANidine (ZANAFLEX) 4 MG tablet Take 1 tablet (4 mg total) by mouth every 6 (six) hours as needed for muscle spasms (if unresponsive to  cyclobenzaprine). Patient taking differently: Take 4-6 mg by mouth every 6 (six) hours as needed for muscle spasms (if unresponsive to cyclobenzaprine).  03/02/17  Yes Meyran, Ocie Cornfield, NP  zolpidem (AMBIEN CR) 12.5 MG CR tablet Take 12.5 mg by mouth at bedtime as needed for sleep.   Yes [provider]  HYDROcodone-acetaminophen (NORCO/VICODIN) 5-325 MG tablet Take 1 tablet by mouth every 6 (six) hours as needed for moderate pain. Patient not taking: Reported on 04/26/2017 03/02/17 03/02/18  Meyran, Ocie Cornfield, NP  hyoscyamine (LEVSIN SL) 0.125 MG SL tablet Place 1 tablet (0.125 mg total) under the tongue every 4 (four) hours as needed for cramping. Patient not taking: Reported on 04/26/2017 02/22/17   Irene Shipper, MD    Family History Family History  Problem Relation Age of Onset  . COPD Mother   . Lung cancer Father   . Diverticulitis Father   . Colitis Neg Hx     Social History Social History   Tobacco Use  . Smoking status: Never Smoker  . Smokeless tobacco: Never Used  Substance Use Topics  . Alcohol use: Yes    Alcohol/week: 0.5 oz    Types: 1 Standard drinks or equivalent per week    Comment: occassionally  . Drug use: No     Allergies   Other and Tape   Review of Systems Review of Systems  Constitutional: Positive for diaphoresis.  Cardiovascular: Positive for chest pain.  Gastrointestinal: Positive for nausea.  Neurological: Negative for weakness.  Psychiatric/Behavioral: The patient is nervous/anxious.   All other systems reviewed and are negative.    Physical Exam Updated Vital Signs BP (!) 148/124 (BP Location: Left Arm)   Pulse (!) 110   Temp 98.1 F (36.7 C) (Oral)   Resp (!) 24   SpO2 98%   Physical Exam CONSTITUTIONAL: Well developed/well nourished, anxious HEAD: Normocephalic/atraumatic EYES: EOMI/PERRL ENMT: Mucous membranes moist NECK: supple no meningeal signs SPINE/BACK:entire spine nontender CV: S1/S2 noted, no  murmurs/rubs/gallops noted LUNGS: Lungs are clear to auscultation bilaterally, no apparent distress ABDOMEN: soft, nontender, no rebound or guarding, bowel sounds noted throughout abdomen GU:no cva tenderness NEURO: Pt is awake/alert/appropriate, moves all extremitiesx4.  No facial droop.   EXTREMITIES: pulses normal/equal x4, full ROM SKIN: warm, color normal PSYCH: Anxious  ED Treatments / Results  Labs (all labs ordered are listed, but only abnormal results are displayed) Labs Reviewed  CBC - Abnormal; Notable for the following components:      Result Value   RBC 3.86 (*)    Hemoglobin 10.1 (*)    HCT 32.0 (*)    All other components within normal limits  BASIC METABOLIC PANEL  BRAIN NATRIURETIC PEPTIDE  I-STAT TROPONIN, ED  I-STAT BETA HCG BLOOD, ED (  [facility], [facility], [facility] ONLY)    EKG  EKG Interpretation  Date/Time:  Wednesday April 26 2017 04:36:02 EST Ventricular Rate:  107 PR Interval:    QRS Duration: 90 QT Interval:  326 QTC Calculation: 435 R Axis:   3 Text Interpretation:  Sinus tachycardia Low voltage, precordial leads Interpretation limited secondary to artifact Confirmed by Ripley Fraise [phone]) on 04/26/2017 4:42:40 AM       EKG Interpretation  Date/Time:  Wednesday April 26 2017 06:54:28 EST Ventricular Rate:  90 PR Interval:    QRS Duration: 93 QT Interval:  360 QTC Calculation: 441 R Axis:   14 Text Interpretation:  Sinus rhythm Low voltage, precordial leads Confirmed by Ripley Fraise [phone]) on 04/26/2017 7:01:02 AM        Radiology Dg Chest 2 View  Result Date: 04/26/2017 CLINICAL DATA:  57 year old female with chest pain. EXAM: CHEST  2 VIEW COMPARISON:  Chest radiograph dated 02/22/2013 FINDINGS: There is shallow inspiration with subsegmental atelectatic changes. Mild diffuse interstitial and central vascular prominence may be related to atelectatic changes or mild congestion. There is no focal consolidation, pleural effusion, or  pneumothorax. Borderline cardiomegaly. Median sternotomy wires. No acute osseous pathology. IMPRESSION: Mild cardiomegaly with possible mild vascular congestion. No focal consolidation. Electronically Signed   By: Anner Crete M.D.   On: 04/26/2017 05:25    Procedures Procedures (including critical care time)  Medications Ordered in ED Medications  nitroGLYCERIN (NITROSTAT) SL tablet 0.4 mg (not administered)  LORazepam (ATIVAN) injection 1 mg (1 mg Intravenous Given 04/26/17 0648)  aspirin chewable tablet 324 mg (324 mg Oral Given 04/26/17 2330)     Initial Impression / Assessment and Plan / ED Course  I have reviewed the triage vital signs and the nursing notes.  Pertinent labs & imaging results that were available during my care of the patient were reviewed by me and considered in my medical decision making (see chart for details).     6:41 AM Patient has a heart score equals 4.  She would need to be admitted Suspicion for PE/dissection is low.  Suspect her anxiety and her elevated blood pressure due to her restless leg syndrome 7:31 AM She is still feeling anxious, reports restless leg syndrome is worsening.  Will first need to get this under control, then treat her chest pain.  She would need to be admitted.  Signed out to Dr. Kathrynn Humble to admit patient  Final Clinical Impressions(s) / ED Diagnoses   Final diagnoses:  None    ED Discharge Orders    None       Ripley Fraise, MD 04/26/17 [phone]

## 2017-04-26 NOTE — H&P (Signed)
History and Physical    [name] VHQ:[id number] DOB: [birth year] DOA: 04/26/2017  PCP: Patient, No Pcp Per Patient coming from: Home  Chief Complaint: Chest pain  HPI: [name] is a 57 y.o. female with medical history significant of restless leg syndrome, metastatic melanoma stage III, asthma, GERD came to the hospital with complains of chest pain.  Patient states she started experiencing nonspecific sharp and sometimes pressure-like chest pain in the anterior chest from right to left side sometimes in her shoulders as well associated with mild feeling of anxiety and some shortness of breath.  This is not exertional in nature and more so constant.  Also reports of severe heartburn which has recently not been controlled with Protonix, has previously seen Dr. Henrene Pastor and had undergone endoscopy in November 2018 which showed GERD, esophageal stricture, large hiatal hernia and fundic polyp. During this time she is also felt her restless leg syndrome has gotten worse but has not been taking her medication. In the ER she appeared very anxious reporting of chest pain.  First set of cardiac enzyme and troponin was negative.  Her vital signs showed slightly elevated blood pressure and tachycardia..  EKG showed sinus tachycardia.  Her home medications were restarted and given aspirin.  Cardiology was contacted who will see the patient.  She was also given 1 inch Nitropaste and 1 mg Ativan X2.  Medical team was asked to admit the patient for further care.     Review of Systems: As per HPI otherwise 10 point review of systems negative.   Past Medical History:  Diagnosis Date  . Anemia   . Arthritis    back & knees   . Asthma    uses albuterol inhaler if outside, cold weather & exercising   . GERD (gastroesophageal reflux disease)   . History of hiatal hernia   . History of kidney stones    passed spontaneously  . Melanoma ([place])    malignant, left leg,  wide excision, sentinel node,  one positive stage 3, as a result- lymphdema, will be following with ONCOLOGY, PET scan- last 2017  . Myasthenia gravis ([place])   . Pneumonia 2015   hosp. [facility] , treated with IV antibiotics   . Sleep apnea    sleep irregular pattern- no CPAP order solidified yet.     Past Surgical History:  Procedure Laterality Date  . APPENDECTOMY    . BREAST SURGERY Bilateral 2004   reduction   . EYE SURGERY Bilateral 1999   corneal peel  . MELANOMA EXCISION Left    removed from left leg  . OOPHORECTOMY Left   . THYMECTOMY  2001  . TOTAL KNEE ARTHROPLASTY Left 01/2016  . WRIST SURGERY Left    plate & 6 screws      reports that  has never smoked. she has never used smokeless tobacco. She reports that she drinks about 0.5 oz of alcohol per week. She reports that she does not use drugs.  Allergies  Allergen Reactions  . Other Other (See Comments)    Cant take some "mycin" drugs   . Tape Other (See Comments)    Blister, irritates skin    Family History  Problem Relation Age of Onset  . COPD Mother   . Lung cancer Father   . Diverticulitis Father   . Colitis Neg Hx      Prior to Admission medications   Medication Sig Start Date End Date Taking? Authorizing Provider  albuterol (PROVENTIL HFA;VENTOLIN HFA)  108 (90 Base) MCG/ACT inhaler Inhale 1 puff into the lungs every 6 (six) hours as needed for wheezing or shortness of breath.   Yes [provider]  dexlansoprazole (DEXILANT) 60 MG capsule Take 1 capsule (60 mg total) by mouth daily. 02/13/17  Yes Levin Erp, PA  gabapentin (NEURONTIN) 300 MG capsule Take 300 mg by mouth at bedtime.   Yes [provider]  nabumetone (RELAFEN) 500 MG tablet Take 500 mg by mouth 2 (two) times daily as needed.    Yes [provider]  Omeprazole 20 MG TBDD Take 20 mg by mouth 2 (two) times daily as needed (for acid reflux).    Yes [provider]  pramipexole (MIRAPEX) 1.5 MG tablet Take 4.5 mg by mouth 3  (three) times daily as needed (for restless leg syndrome).    Yes [provider]  tapentadol HCl (NUCYNTA) 75 MG tablet Take 75 mg by mouth every 4 (four) hours as needed for severe pain.    Yes [provider]  tiZANidine (ZANAFLEX) 4 MG tablet Take 1 tablet (4 mg total) by mouth every 6 (six) hours as needed for muscle spasms (if unresponsive to cyclobenzaprine). Patient taking differently: Take 4-6 mg by mouth every 6 (six) hours as needed for muscle spasms (if unresponsive to cyclobenzaprine).  03/02/17  Yes Meyran, Ocie Cornfield, NP  zolpidem (AMBIEN CR) 12.5 MG CR tablet Take 12.5 mg by mouth at bedtime as needed for sleep.   Yes [provider]  HYDROcodone-acetaminophen (NORCO/VICODIN) 5-325 MG tablet Take 1 tablet by mouth every 6 (six) hours as needed for moderate pain. Patient not taking: Reported on 04/26/2017 03/02/17 03/02/18  Meyran, Ocie Cornfield, NP  hyoscyamine (LEVSIN SL) 0.125 MG SL tablet Place 1 tablet (0.125 mg total) under the tongue every 4 (four) hours as needed for cramping. Patient not taking: Reported on 04/26/2017 02/22/17   Irene Shipper, MD    Physical Exam: Vitals:   04/26/17 0432 04/26/17 0718 04/26/17 0736 04/26/17 0851  BP: (!) 148/124  (!) 156/88 (!) 142/125  Pulse: (!) 110 (!) 110 99 (!) 107  Resp: (!) 24 20 14    Temp: 98.1 F (36.7 C) 98.1 F (36.7 C)    TempSrc: Oral Oral    SpO2: 98% 99% 98% 99%      Constitutional: Very anxious and restless appearing, constantly keeps on moving her legs. Vitals:   04/26/17 0432 04/26/17 0718 04/26/17 0736 04/26/17 0851  BP: (!) 148/124  (!) 156/88 (!) 142/125  Pulse: (!) 110 (!) 110 99 (!) 107  Resp: (!) 24 20 14    Temp: 98.1 F (36.7 C) 98.1 F (36.7 C)    TempSrc: Oral Oral    SpO2: 98% 99% 98% 99%   Eyes: PERRL, lids and conjunctivae normal ENMT: Mucous membranes are moist. Posterior pharynx clear of any exudate or lesions.Normal dentition.  Neck: normal, supple, no masses,  no thyromegaly Respiratory: clear to auscultation bilaterally, no wheezing, no crackles. Normal respiratory effort. No accessory muscle use.  Cardiovascular: Regular rate and rhythm, no murmurs / rubs / gallops. No extremity edema. 2+ pedal pulses. No carotid bruits.  1 inch nitro Abdomen: no tenderness, no masses palpated. No hepatosplenomegaly. Bowel sounds positive.  Musculoskeletal: no clubbing / cyanosis. No joint deformity upper and lower extremities. Good ROM, no contractures. Normal muscle tone.  Skin: no rashes, lesions, ulcers. No induration Neurologic: CN 2-12 grossly intact. Sensation intact, DTR normal. Strength 5/5 in all 4.  Psychiatric: Alert awake  oriented, normal mood but extremely anxious    Labs on Admission: I have personally reviewed following labs and imaging studies  CBC: Recent Labs  Lab 04/26/17 0547  WBC 6.5  HGB 10.1*  HCT 32.0*  MCV 82.9  PLT 510   Basic Metabolic Panel: Recent Labs  Lab 04/26/17 0637  NA 139  K 3.8  CL 102  CO2 26  GLUCOSE 109*  BUN 17  CREATININE 0.71  CALCIUM 9.1   GFR: CrCl cannot be calculated (Unknown ideal weight.). Liver Function Tests: No results for input(s): AST, ALT, ALKPHOS, BILITOT, PROT, ALBUMIN in the last 168 hours. No results for input(s): LIPASE, AMYLASE in the last 168 hours. No results for input(s): AMMONIA in the last 168 hours. Coagulation Profile: No results for input(s): INR, PROTIME in the last 168 hours. Cardiac Enzymes: No results for input(s): CKTOTAL, CKMB, CKMBINDEX, TROPONINI in the last 168 hours. BNP (last 3 results) No results for input(s): PROBNP in the last 8760 hours. HbA1C: No results for input(s): HGBA1C in the last 72 hours. CBG: No results for input(s): GLUCAP in the last 168 hours. Lipid Profile: No results for input(s): CHOL, HDL, LDLCALC, TRIG, CHOLHDL, LDLDIRECT in the last 72 hours. Thyroid Function Tests: No results for input(s): TSH, T4TOTAL, FREET4, T3FREE, THYROIDAB  in the last 72 hours. Anemia Panel: No results for input(s): VITAMINB12, FOLATE, FERRITIN, TIBC, IRON, RETICCTPCT in the last 72 hours. Urine analysis:    Component Value Date/Time   COLORURINE YELLOW 02/22/2013 1418   APPEARANCEUR CLOUDY (A) 02/22/2013 1418   LABSPEC 1.013 02/22/2013 1418   PHURINE 5.5 02/22/2013 1418   GLUCOSEU NEGATIVE 02/22/2013 1418   HGBUR NEGATIVE 02/22/2013 1418   BILIRUBINUR NEGATIVE 02/22/2013 1418   KETONESUR NEGATIVE 02/22/2013 1418   PROTEINUR NEGATIVE 02/22/2013 1418   UROBILINOGEN 0.2 02/22/2013 1418   NITRITE NEGATIVE 02/22/2013 1418   LEUKOCYTESUR NEGATIVE 02/22/2013 1418   Sepsis Labs: !!!!!!!!!!!!!!!!!!!!!!!!!!!!!!!!!!!!!!!!!!!! @LABRCNTIP (procalcitonin:4,lacticidven:4) )No results found for this or any previous visit (from the past 240 hour(s)).   Radiological Exams on Admission: Dg Chest 2 View  Result Date: 04/26/2017 CLINICAL DATA:  57 year old female with chest pain. EXAM: CHEST  2 VIEW COMPARISON:  Chest radiograph dated 02/22/2013 FINDINGS: There is shallow inspiration with subsegmental atelectatic changes. Mild diffuse interstitial and central vascular prominence may be related to atelectatic changes or mild congestion. There is no focal consolidation, pleural effusion, or pneumothorax. Borderline cardiomegaly. Median sternotomy wires. No acute osseous pathology. IMPRESSION: Mild cardiomegaly with possible mild vascular congestion. No focal consolidation. Electronically Signed   By: Anner Crete M.D.   On: 04/26/2017 05:25    EKG: Independently reviewed.  It is tachycardia without any acute ST-T changes  Assessment/Plan Active Problems:   Chest pain    Atypical Chest Pain  -Admit the patient for chest pain workup -Aspirin given in ED - EKG shows- Sinus Tachycardia, without any acute ST T Changes  -Trend cardiac enzymes, check TSH, A1c and lipid panel for risk stratification -Consulted Cardiology  -Supplemental oxygen as  necessary -Nitro paste placed  -Due to component of anxiety, will give patient 2 mg of oral Xanax. -Could also be related to GERD therefore getting IV Protonix twice daily  Uncontrolled Restless leg Syndrome  -Has not taken her medications in couple of days.  Will restart today including gabapentin, Zanaflex and pramipexole  Moderate symptomatic GERD -Started Protonix IV every 12 hours Upper endoscopy done in November 2018 showed 1 moderate benign-appearing intrinsic stenosis otherwise normal esophagus, esophageal stricture, large  hiatal hernia, small benign fundic gland polyps  Chronic Low Back Pain s/p Recent Surgery  -Underwent PLIF L3-L5 on 03/01/17.  Continue pain control  Metastatic Melanoma Stage III, per records  -Patient needs to follow-up with someone in [place] but unable to get appointment here.  I have contacted cancer Center at [facility] who will call the patient today so they can figure out what information is necessary from the patient so she can get her first appointment.  Osteoarthrititis -Pain control  Asthma  -Nebulizer treatments as needed  Obstructive sleep apnea -Not on CPAP at home, we can order one here if needed.   DVT prophylaxis: SCDs, early ambulation  Code Status: Full  Family Communication: Husband at bedside Disposition Plan:  TBD Consults called: Cardiology called in ED Admission status: Obs tele    Reilyn Nelson Arsenio Loader MD [facility] Pager [phone]  If 7PM-7AM, please contact night-coverage [email or web address] Password [facility]  04/26/2017, 9:06 AM

## 2017-04-26 NOTE — ED Notes (Signed)
Spoke with hospitalist and received orders. RUQ ultrasound was just completed.

## 2017-04-26 NOTE — ED Notes (Signed)
Delay related to CT calling hospitalist about CT order before transport upstairs.

## 2017-04-26 NOTE — ED Triage Notes (Signed)
Pt complains of right sided chest pain that woke her up this am, she has multiple health problems but was concerned with chest pain and shortness of breath Pt can't lay still because her body is twitching  Pt has also taken her night meds and is lethargic

## 2017-04-26 NOTE — ED Notes (Signed)
Patients husband came outside to the nurses station reporting patient needed additional pain medication for his spouse and that she had some medication in her 51. Went to bedside, patient attempted to look for the medication but was unsuccessful in finding medication. Reviewed medication with patient and spouse. From her medication list, she has been given all the medication for her pain. Patient has voiced that she is wanting to leave. Informed that I would page provider for patients pain management. Almyra Free, NT is paging provider for concerns. While in the room, patient was unable to keep her legs still and constantly moved them.

## 2017-04-26 NOTE — ED Notes (Signed)
Repeat EKG given to EDP,Wickline,MD., for review.

## 2017-04-26 NOTE — ED Notes (Signed)
Gave report to [facility], Therapist, sports for [phone]

## 2017-04-26 NOTE — ED Notes (Signed)
CT was completed before patient left from the ED.

## 2017-04-26 NOTE — Plan of Care (Signed)
  Nutrition: Adequate nutrition will be maintained 04/26/2017 1811 - Progressing by Dorene Sorrow, RN   Pain Managment: General experience of comfort will improve 04/26/2017 1811 - Progressing by Dorene Sorrow, RN   Safety: Ability to remain free from injury will improve 04/26/2017 1811 - Progressing by Dorene Sorrow, RN

## 2017-04-26 NOTE — ED Notes (Signed)
ED TO INPATIENT HANDOFF REPORT  Name/Age/Gender [name] 57 y.o. female  Code Status    Code Status Orders  (From admission, onward)        Start     Ordered   04/26/17 0852  Full code  Continuous     04/26/17 0852    Code Status History    Date Active Date Inactive Code Status Order ID Comments User Context   03/01/2017 17:24 03/02/2017 13:15 Full Code [id number]  Kary Kos, MD Inpatient      Home/SNF/Other Home  Chief Complaint Chest Pain  Level of Care/Admitting Diagnosis ED Disposition    ED Disposition Condition [place] [facility] Area: [facility] [[id number]]  Level of Care: Telemetry [5]  Admit to tele based on following criteria: Monitor for Ischemic changes  Diagnosis: Chest pain [244628]  Admitting Physician: Gerlean Ren [facility] [[id number]]  Attending Physician: Gerlean Ren [facility] [[id number]]  PT Class (Do Not Modify): Observation [104]  PT Acc Code (Do Not Modify): Observation [10022]       Medical History Past Medical History:  Diagnosis Date  . Anemia   . Arthritis    back & knees   . Asthma    uses albuterol inhaler if outside, cold weather & exercising   . GERD (gastroesophageal reflux disease)   . History of hiatal hernia   . History of kidney stones    passed spontaneously  . Melanoma ([place])    malignant, left leg,  wide excision, sentinel node, one positive stage 3, as a result- lymphdema, will be following with ONCOLOGY, PET scan- last 2017  . Myasthenia gravis ([place])   . Pneumonia 2015   hosp. [facility] , treated with IV antibiotics   . Sleep apnea    sleep irregular pattern- no CPAP order solidified yet.     Allergies Allergies  Allergen Reactions  . Other Other (See Comments)    Cant take some "mycin" drugs   . Tape Other (See Comments)    Blister, irritates skin    IV Location/Drains/Wounds Patient Lines/Drains/Airways Status   Active Line/Drains/Airways    Name:   Placement date:   Placement  time:   Site:   Days:   Incision (Closed) 03/01/17 Back   03/01/17    1244     56          Labs/Imaging Results for orders placed or performed during the hospital encounter of 04/26/17 (from the past 48 hour(s))  CBC     Status: Abnormal   Collection Time: 04/26/17  5:47 AM  Result Value Ref Range   WBC 6.5 4.0 - 10.5 K/uL   RBC 3.86 (L) 3.87 - 5.11 MIL/uL   Hemoglobin 10.1 (L) 12.0 - 15.0 g/dL   HCT 32.0 (L) 36.0 - 46.0 %   MCV 82.9 78.0 - 100.0 fL   MCH 26.2 26.0 - 34.0 pg   MCHC 31.6 30.0 - 36.0 g/dL   RDW 15.0 11.5 - 15.5 %   Platelets 359 150 - 400 K/uL    Comment: Performed at [facility], [place] [address], [place], [place] [address]  Basic metabolic panel     Status: Abnormal   Collection Time: 04/26/17  6:37 AM  Result Value Ref Range   Sodium 139 135 - 145 mmol/L   Potassium 3.8 3.5 - 5.1 mmol/L   Chloride 102 101 - 111 mmol/L   CO2 26 22 - 32 mmol/L   Glucose, Bld 109 (  H) 65 - 99 mg/dL   BUN 17 6 - 20 mg/dL   Creatinine, Ser 0.71 0.44 - 1.00 mg/dL   Calcium 9.1 8.9 - 10.3 mg/dL   GFR calc non Af Amer >60 >60 mL/min   GFR calc Af Amer >60 >60 mL/min    Comment: (NOTE) The eGFR has been calculated using the CKD EPI equation. This calculation has not been validated in all clinical situations. eGFR's persistently <60 mL/min signify possible Chronic Kidney Disease.    Anion gap 11 5 - 15    Comment: Performed at [facility], [place] [address] [place], [place] [address]  Brain natriuretic peptide     Status: None   Collection Time: 04/26/17  6:37 AM  Result Value Ref Range   B Natriuretic Peptide 15.5 0.0 - 100.0 pg/mL    Comment: Performed at [facility], [place] [address] [place], [place] [address]  I-Stat beta hCG blood, ED     Status: None   Collection Time: 04/26/17  6:52 AM  Result Value Ref Range   I-stat hCG, quantitative <5.0 <5 mIU/mL   Comment 3            Comment:   GEST. AGE      CONC.   (mIU/mL)   <=1 WEEK        5 - 50     2 WEEKS       50 - 500     3 WEEKS       100 - 10,000     4 WEEKS     1,000 - 30,000        FEMALE AND NON-PREGNANT FEMALE:     LESS THAN 5 mIU/mL   I-stat troponin, ED     Status: None   Collection Time: 04/26/17  6:53 AM  Result Value Ref Range   Troponin i, poc 0.00 0.00 - 0.08 ng/mL   Comment 3            Comment: Due to the release kinetics of cTnI, a negative result within the first hours of the onset of symptoms does not rule out myocardial infarction with certainty. If myocardial infarction is still suspected, repeat the test at appropriate intervals.   Troponin I     Status: None   Collection Time: 04/26/17 10:29 AM  Result Value Ref Range   Troponin I <0.03 <0.03 ng/mL    Comment: Performed at [facility], [place] [address], [place], [place] [address]  TSH     Status: None   Collection Time: 04/26/17 10:29 AM  Result Value Ref Range   TSH 1.528 0.350 - 4.500 uIU/mL    Comment: Performed by a 3rd Generation assay with a functional sensitivity of <=0.01 uIU/mL. Performed at [facility], [place] [address], [place], [place] [address]   Hemoglobin A1c     Status: Abnormal   Collection Time: 04/26/17 10:29 AM  Result Value Ref Range   Hgb A1c MFr Bld 5.8 (H) 4.8 - 5.6 %    Comment: (NOTE) Pre diabetes:          5.7%-6.4% Diabetes:              >6.4% Glycemic control for   <7.0% adults with diabetes    Mean Plasma Glucose 119.76 mg/dL    Comment: Performed at [place] [address], [place],  [address]  Lipid panel     Status: Abnormal  Collection Time: 04/26/17 10:29 AM  Result Value Ref Range   Cholesterol 215 (H) 0 - 200 mg/dL   Triglycerides 153 (H) <150 mg/dL   HDL 55 >40 mg/dL   Total CHOL/HDL Ratio 3.9 RATIO   VLDL 31 0 - 40 mg/dL   LDL Cholesterol 129 (H) 0 - 99 mg/dL    Comment:        Total Cholesterol/HDL:CHD Risk Coronary Heart Disease Risk Table                      Men   Women  1/2 Average Risk   3.4   3.3  Average Risk       5.0   4.4  2 X Average Risk   9.6   7.1  3 X Average Risk  23.4   11.0        Use the calculated Patient Ratio above and the CHD Risk Table to determine the patient's CHD Risk.        ATP III CLASSIFICATION (LDL):  <100     mg/dL   Optimal  100-129  mg/dL   Near or Above                    Optimal  130-159  mg/dL   Borderline  160-189  mg/dL   High  >190     mg/dL   Very High Performed at [place] [address] [place], [place] [address]   Hepatic function panel     Status: Abnormal   Collection Time: 04/26/17 10:29 AM  Result Value Ref Range   Total Protein 7.2 6.5 - 8.1 g/dL   Albumin 3.6 3.5 - 5.0 g/dL   AST 22 15 - 41 U/L   ALT 19 14 - 54 U/L   Alkaline Phosphatase 101 38 - 126 U/L   Total Bilirubin 0.5 0.3 - 1.2 mg/dL   Bilirubin, Direct <0.1 (L) 0.1 - 0.5 mg/dL   Indirect Bilirubin NOT CALCULATED 0.3 - 0.9 mg/dL    Comment: Performed at [facility], [place] [address], [place], [place] [address]  Lipase, blood     Status: None   Collection Time: 04/26/17 10:29 AM  Result Value Ref Range   Lipase 23 11 - 51 U/L    Comment: Performed at [facility], [place] [address], [place], [place] [address]   Dg Chest 2 View  Result Date: 04/26/2017 CLINICAL DATA:  57 year old female with chest pain. EXAM: CHEST  2 VIEW COMPARISON:  Chest radiograph dated 02/22/2013 FINDINGS: There is shallow inspiration with subsegmental atelectatic changes. Mild diffuse interstitial and central vascular prominence may be related to atelectatic changes or mild congestion. There is no focal consolidation, pleural effusion, or pneumothorax. Borderline cardiomegaly. Median sternotomy wires. No acute osseous pathology. IMPRESSION: Mild cardiomegaly with possible mild vascular congestion. No focal consolidation. Electronically Signed   By: Anner Crete M.D.   On: 04/26/2017  05:25   US Abdomen Limited Ruq  Result Date: 04/26/2017 CLINICAL DATA:  Right upper quadrant abdominal pain EXAM: ULTRASOUND ABDOMEN LIMITED RIGHT UPPER QUADRANT COMPARISON:  None. FINDINGS: Gallbladder: No gallstones or wall thickening visualized. No sonographic Murphy sign noted by sonographer. Common bile duct: Diameter: 5 mm Liver: No focal lesion identified. Coarse echogenic liver with poor sonic penetration compatible with diffuse hepatic steatosis. Portal vein is patent on color Doppler imaging with normal direction of blood flow towards the liver. IMPRESSION: 1. Coarse echogenic liver  with poor sonic penetration compatible with diffuse hepatic steatosis. 2. Otherwise normal sonographic appearance of the hepatobiliary system. Electronically Signed   By: Van Clines M.D.   On: 04/26/2017 12:53    Pending Labs Unresulted Labs (From admission, onward)   Start     Ordered   04/27/17 0500  Comprehensive metabolic panel  Tomorrow morning,   R     04/26/17 0852   04/27/17 0500  CBC  Tomorrow morning,   R     04/26/17 0852   04/26/17 0853  Troponin I  Now then every 6 hours,   R     04/26/17 0852   04/26/17 0851  HIV antibody (Routine Testing)  Once,   R     04/26/17 0852      Vitals/Pain Today's Vitals   04/26/17 0851 04/26/17 0900 04/26/17 1053 04/26/17 1234  BP: (!) 142/125 (!) 146/94 (!) 162/145 (!) 138/106  Pulse: (!) 107 98 (!) 111 (!) 123  Resp:   17 (!) 23  Temp:      TempSrc:      SpO2: 99% 96% 93% 97%  PainSc:    10-Worst pain ever    Isolation Precautions No active isolations  Medications Medications  nabumetone (RELAFEN) tablet 500 mg (not administered)  pramipexole (MIRAPEX) tablet 1.5 mg (1.5 mg Oral Given 04/26/17 1051)  0.9 %  sodium chloride infusion ( Intravenous New Bag/Given 04/26/17 1053)  acetaminophen (TYLENOL) tablet 650 mg (not administered)    Or  acetaminophen (TYLENOL) suppository 650 mg (not administered)  senna-docusate (Senokot-S) tablet  1 tablet (not administered)  bisacodyl (DULCOLAX) EC tablet 5 mg (not administered)  ondansetron (ZOFRAN) tablet 4 mg ( Oral See Alternative 04/26/17 1229)    Or  ondansetron (ZOFRAN) injection 4 mg (4 mg Intravenous Given 04/26/17 1229)  pantoprazole (PROTONIX) injection 40 mg (40 mg Intravenous Given 04/26/17 1053)  albuterol (PROVENTIL) (2.5 MG/3ML) 0.083% nebulizer solution 3 mL (not administered)  gabapentin (NEURONTIN) capsule 300 mg (300 mg Oral Given 04/26/17 1051)  tiZANidine (ZANAFLEX) tablet 4 mg (4 mg Oral Given 04/26/17 1051)  tapentadol (NUCYNTA) tablet 75 mg (not administered)  zolpidem (AMBIEN) tablet 5 mg (not administered)  iopamidol (ISOVUE-370) 76 % injection (not administered)  sodium chloride 0.9 % injection (not administered)  LORazepam (ATIVAN) injection 1 mg (1 mg Intravenous Given 04/26/17 0648)  aspirin chewable tablet 324 mg (324 mg Oral Given 04/26/17 0621)  LORazepam (ATIVAN) injection 1 mg (1 mg Intravenous Given 04/26/17 0741)  nitroGLYCERIN (NITROGLYN) 2 % ointment 1 inch (1 inch Topical Given 04/26/17 0854)  ALPRAZolam (XANAX) tablet 2 mg (2 mg Oral Given 04/26/17 0917)  oxyCODONE (Oxy IR/ROXICODONE) immediate release tablet 10 mg (10 mg Oral Given 04/26/17 1241)    Mobility walks

## 2017-04-27 DIAGNOSIS — K219 Gastro-esophageal reflux disease without esophagitis: Secondary | ICD-10-CM

## 2017-04-27 DIAGNOSIS — G2581 Restless legs syndrome: Secondary | ICD-10-CM | POA: Diagnosis not present

## 2017-04-27 DIAGNOSIS — R079 Chest pain, unspecified: Secondary | ICD-10-CM | POA: Diagnosis not present

## 2017-04-27 LAB — COMPREHENSIVE METABOLIC PANEL
ALBUMIN: 3.7 g/dL (ref 3.5–5.0)
ALT: 20 U/L (ref 14–54)
ANION GAP: 10 (ref 5–15)
AST: 24 U/L (ref 15–41)
Alkaline Phosphatase: 94 U/L (ref 38–126)
BILIRUBIN TOTAL: 0.6 mg/dL (ref 0.3–1.2)
BUN: 12 mg/dL (ref 6–20)
CHLORIDE: 103 mmol/L (ref 101–111)
CO2: 27 mmol/L (ref 22–32)
Calcium: 8.8 mg/dL — ABNORMAL LOW (ref 8.9–10.3)
Creatinine, Ser: 0.76 mg/dL (ref 0.44–1.00)
GFR calc Af Amer: >60 mL/min (ref >60)
Glucose, Bld: 106 mg/dL — ABNORMAL HIGH (ref 65–99)
POTASSIUM: 3.8 mmol/L (ref 3.5–5.1)
Sodium: 140 mmol/L (ref 135–145)
TOTAL PROTEIN: 6.9 g/dL (ref 6.5–8.1)

## 2017-04-27 LAB — CBC
HEMATOCRIT: 29 % — AB (ref 36.0–46.0)
HEMOGLOBIN: 9.1 g/dL — AB (ref 12.0–15.0)
MCH: 26.5 pg (ref 26.0–34.0)
MCHC: 31.4 g/dL (ref 30.0–36.0)
MCV: 84.3 fL (ref 78.0–100.0)
Platelets: 319 10*3/uL (ref 150–400)
RBC: 3.44 MIL/uL — ABNORMAL LOW (ref 3.87–5.11)
RDW: 15.2 % (ref 11.5–15.5)
WBC: 8.4 10*3/uL (ref 4.0–10.5)

## 2017-04-27 LAB — GLUCOSE, CAPILLARY
GLUCOSE-CAPILLARY: 98 mg/dL (ref 65–99)
Glucose-Capillary: 93 mg/dL (ref 65–99)

## 2017-04-27 LAB — HIV ANTIBODY (ROUTINE TESTING W REFLEX): HIV SCREEN 4TH GENERATION: NONREACTIVE

## 2017-04-27 MED ORDER — PANTOPRAZOLE SODIUM 40 MG PO TBEC
40.0000 mg | DELAYED_RELEASE_TABLET | Freq: Two times a day (BID) | ORAL | Status: DC
  Administered 2017-04-27: 40 mg via ORAL
  Filled 2017-04-27: qty 1

## 2017-04-27 MED ORDER — FAMOTIDINE 40 MG PO TABS: 40.0000 mg | ORAL_TABLET | Freq: Two times a day (BID) | ORAL | 0 refills | Status: DC

## 2017-04-27 NOTE — Care Management Note (Signed)
Case Management Note  CM consulted by Dr Quincy Simmonds for no pcp with need for follow up.  CM advised him that pt needs to call the number on the back of her ins card to find in-network providers to establish care with but CM would also reach out to [facility] for possible TOC appointment and speak with the pt before she transitioned home.    At this time CM has not heard back from [facility] CM.    CM spoke with pt and advised of the same.  Advised her that she may hear from the [facility] CM for possible appointment but may not as well.  Pt and spouse acknowledged calling ins card and scheduling a follow up appointment to establish care with someone who is accepting new patients.  They had no further questions and thanked CM.  No further CM needs noted at this time.

## 2017-04-27 NOTE — Progress Notes (Signed)
The patient is receiving Protonix by the intravenous route.  Based on criteria approved by the Pharmacy and [facility], the medication is being converted to the equivalent oral dose form.  These criteria include: -No active GI bleeding -Able to tolerate diet of full liquids (or better) or tube feeding -Able to tolerate other medications by the oral or enteral route  If you have any questions about this conversion, please contact the Pharmacy Department (phone [phone]).  Thank you. Eudelia Bunch, Pharm.D. [phone] 04/27/2017 8:41 AM

## 2017-04-27 NOTE — Discharge Summary (Signed)
Physician Discharge Summary  [name]  OXB:[id number]  DOB: [birth year]  DOA: 04/26/2017 PCP: Patient, No Pcp Per  Admit date: 04/26/2017 Discharge date: 04/27/2017  Admitted From: Home Disposition: Home  Recommendations for Outpatient Follow-up:  1. Follow up with PCP in 1-2 weeks - information provided to establish with PCP 2. Follow-up with neurologist to establish care - numbers provided in discharge papers  3. Please obtain BMP/CBC to monitor renal function and hemoglobin 4. Refer patient to sleep studies  Discharge Condition: Stable CODE STATUS: Full code Diet recommendation: Heart Healthy   Brief/Interim Summary: For full details see H&P/Progress note, but in brief, [name] is a 57 year old female with medical history significant for restless leg syndrome, metastatic melanoma stage III, asthma, GERD and hiatal hernia presented to the hospital complaining of chest pain.  Pain was nonspecific and was associated with anxiety and shortness of breath.  Upon ED evaluation patient was found to be very anxious and reporting severe chest pain.  Cardiac enzymes and EKG were obtained which were negative.  Patient was found to have slight elevated blood pressure and tachycardia therefore she was admitted for chest pain rule out ACS.  Cardiology was consulted and recommended patient to have a CTA to rule out PE and dissection of the aorta, CTA was negative.  EKGs and troponin remained normal.  Patient was also started on IV Protonix for possible gastritis.  Patient has significantly improved.  Pain felt to be related to anxiety and possible GERD.  Patient was deemed stable for discharge and follow-up as an outpatient.   Subjective: Patient seen and examined, she reported mild discomfort in her chest but much better than yesterday.  Breathing is back to normal.  She complains of pain all over her body and her restless leg is more controlled today.   Discharge Diagnoses/Hospital  Course:  Atypical chest pain - ACS essentially rule out as there was no EKG changes, troponins negative and chest tender with palpation.  Patient may need stress test as an outpatient. CT angiogram does not show any our dissection or PE. Cardiology consulted did not felt to be acute coronary syndrome Elevated LDL and total cholesterol, discussed with patient medical treatment versus lifestyle modification.  For now patient will try lifestyle modification recheck in 3 months. Chest pain felt to be related to anxiety and GERD  Uncontrolled restless leg syndrome Due to non-medication compliance. Her home medications were restarted and symptoms has significantly improved Continue home medication, advised to establish care with neurology for further management in outpatient setting.  GERD with large hiatal hernia She was treated with Protonix IV which relieved some of the reflux Switched PPI's to Pepcid 40 mg twice daily. Diet discussed with patient  Chronic back pain status post surgery Underwent PLIF on 03/01/17.  Continue pain control  Metastatic melanoma stage III Patient recently moved from [place] for the cancer center has been placed on discharge instruction for patient to follow-up as an outpatient.   OSA Not on CPAP at home Need outpatient studies for possible CPAP machine.  All other chronic medical condition were stable during the hospitalization.  On the day of the discharge the patient's vitals were stable, and no other acute medical condition were reported by patient. the patient was felt safe to be discharge to home  Discharge Instructions  You were cared for by a hospitalist during your hospital stay. If you have any questions about your discharge medications or the care you received while you were in  the hospital after you are discharged, you can call the unit and asked to speak with the hospitalist on call if the hospitalist that took care of you is not  available. Once you are discharged, your primary care physician will handle any further medical issues. Please note that NO REFILLS for any discharge medications will be authorized once you are discharged, as it is imperative that you return to your primary care physician (or establish a relationship with a primary care physician if you do not have one) for your aftercare needs so that they can reassess your need for medications and monitor your lab values.  Discharge Instructions    Call MD for:  difficulty breathing, headache or visual disturbances   Complete by:  As directed    Call MD for:  extreme fatigue   Complete by:  As directed    Call MD for:  hives   Complete by:  As directed    Call MD for:  persistant dizziness or light-headedness   Complete by:  As directed    Call MD for:  persistant nausea and vomiting   Complete by:  As directed    Call MD for:  redness, tenderness, or signs of infection (pain, swelling, redness, odor or green/yellow discharge around incision site)   Complete by:  As directed    Call MD for:  severe uncontrolled pain   Complete by:  As directed    Call MD for:  temperature >100.4   Complete by:  As directed    Diet - low sodium heart healthy   Complete by:  As directed    Increase activity slowly   Complete by:  As directed      Allergies as of 04/27/2017      Reactions   Other Other (See Comments)   Cant take some "mycin" drugs    Tape Other (See Comments)   Blister, irritates skin      Medication List    STOP taking these medications   dexlansoprazole 60 MG capsule Commonly known as:  DEXILANT   hyoscyamine 0.125 MG SL tablet Commonly known as:  LEVSIN SL   Omeprazole 20 MG Tbdd     TAKE these medications   albuterol 108 (90 Base) MCG/ACT inhaler Commonly known as:  PROVENTIL HFA;VENTOLIN HFA Inhale 1 puff into the lungs every 6 (six) hours as needed for wheezing or shortness of breath.   famotidine 40 MG tablet Commonly known as:   PEPCID Take 1 tablet (40 mg total) by mouth 2 (two) times daily.   gabapentin 300 MG capsule Commonly known as:  NEURONTIN Take 300 mg by mouth at bedtime.   HYDROcodone-acetaminophen 5-325 MG tablet Commonly known as:  NORCO/VICODIN Take 1 tablet by mouth every 6 (six) hours as needed for moderate pain.   nabumetone 500 MG tablet Commonly known as:  RELAFEN Take 500 mg by mouth 2 (two) times daily as needed.   NUCYNTA 75 MG tablet Generic drug:  tapentadol HCl Take 75 mg by mouth every 4 (four) hours as needed for severe pain.   pramipexole 1.5 MG tablet Commonly known as:  MIRAPEX Take 4.5 mg by mouth 3 (three) times daily as needed (for restless leg syndrome).   tiZANidine 4 MG tablet Commonly known as:  ZANAFLEX Take 1 tablet (4 mg total) by mouth every 6 (six) hours as needed for muscle spasms (if unresponsive to cyclobenzaprine). What changed:  how much to take   zolpidem 12.5 MG CR tablet Commonly  known as:  AMBIEN CR Take 12.5 mg by mouth at bedtime as needed for sleep.      Follow-up Information    [facility]. Schedule an appointment as soon as possible for a visit in 2 week(s).   Specialty:  Neurology Why:  Establish care Contact information: [address] [place] [phone]       [facility]. Schedule an appointment as soon as possible for a visit in 2 week(s).   Why:  Call either neurologist group to establish care Contact information: [place], [place] [phone]       [facility]. Schedule an appointment as soon as possible for a visit in 1 week(s).   Specialty:  Oncology Why:  Call to make an appointment as soon as possible.  Contact information: [place] [id number] [place] [address] [phone]         Allergies  Allergen Reactions  . Other Other (See Comments)    Cant  take some "mycin" drugs   . Tape Other (See Comments)    Blister, irritates skin    Consultations:  Cardiology   Procedures/Studies: Dg Chest 2 View  Result Date: 04/26/2017 CLINICAL DATA:  57 year old female with chest pain. EXAM: CHEST  2 VIEW COMPARISON:  Chest radiograph dated 02/22/2013 FINDINGS: There is shallow inspiration with subsegmental atelectatic changes. Mild diffuse interstitial and central vascular prominence may be related to atelectatic changes or mild congestion. There is no focal consolidation, pleural effusion, or pneumothorax. Borderline cardiomegaly. Median sternotomy wires. No acute osseous pathology. IMPRESSION: Mild cardiomegaly with possible mild vascular congestion. No focal consolidation. Electronically Signed   By: Anner Crete M.D.   On: 04/26/2017 05:25   Ct Angio Chest/abd/pel For Dissection W And/or W/wo  Result Date: 04/26/2017 CLINICAL DATA:  Chest pain. EXAM: CT ANGIOGRAPHY CHEST, ABDOMEN AND PELVIS TECHNIQUE: Multidetector CT imaging through the chest, abdomen and pelvis was performed using the standard protocol during bolus administration of intravenous contrast. Multiplanar reconstructed images and MIPs were obtained and reviewed to evaluate the vascular anatomy. CONTRAST:  100 mL Isovue 370 COMPARISON:  CT abdomen and pelvis 05/16/2008. FINDINGS: CTA CHEST FINDINGS Cardiovascular: There is no evidence of thoracic aortic intramural hematoma on precontrast images. There is no evidence of thoracic aortic dissection or aneurysm. No significant coronary artery calcification is identified on this nongated study. The heart is borderline enlarged. There is no pericardial effusion Mediastinum/Nodes: No enlarged axillary, mediastinal, or hilar lymph nodes. Prior sternotomy with history of thymectomy. No mediastinal mass. Small sliding hiatal hernia. Unremarkable thyroid. Lungs/Pleura: No pleural effusion or pneumothorax. Mildly limited assessment of the lung  parenchyma due to respiratory motion artifact. No consolidation or mass. Musculoskeletal: No acute osseous abnormality. Advanced disc space narrowing and degenerative endplate spurring at K9-1 resulting in likely moderate spinal and severe left neural foraminal stenosis. Review of the MIP images confirms the above findings. CTA ABDOMEN AND PELVIS FINDINGS VASCULAR Aorta: Normal caliber aorta without aneurysm, dissection, vasculitis or significant stenosis. Celiac: Patent without evidence of aneurysm, dissection, vasculitis or significant stenosis. SMA: Patent without evidence of aneurysm, dissection, vasculitis or significant stenosis. Renals: Both renal arteries are patent without evidence of aneurysm, dissection, vasculitis, fibromuscular dysplasia or significant stenosis. IMA: Patent without evidence of aneurysm, dissection, vasculitis or significant stenosis. Inflow: Patent without evidence of aneurysm, dissection, vasculitis or significant stenosis. Veins: No obvious venous abnormality within the limitations of this arterial phase study.  Review of the MIP images confirms the above findings. NON-VASCULAR Hepatobiliary: No focal liver abnormality is seen. No gallstones, gallbladder wall thickening, or biliary dilatation. Pancreas: Unremarkable. Spleen: Unremarkable. Adrenals/Urinary Tract: Unremarkable adrenal glands. No evidence of renal mass, calculi, or hydronephrosis. Unremarkable bladder. Stomach/Bowel: No bowel obstruction. Minimal diverticulosis of the sigmoid colon without evidence of diverticulitis. Status post appendectomy. Lymphatic: No enlarged lymph nodes. Reproductive: Multiple uterine fibroids with overall decreased size of the uterus compared to the prior study. No adnexal mass. Other: No intraperitoneal free fluid.  No abdominal wall hernia. Musculoskeletal: L3-L5 fusion. Severe facet arthrosis at L5-S1 greater than L2-3. No acute osseous abnormality. Review of the MIP images confirms the above  findings. IMPRESSION: 1. No aortic dissection or other acute abnormality identified in the chest, abdomen, or pelvis. 2. Uterine fibroids. Electronically Signed   By: Logan Bores M.D.   On: 04/26/2017 14:22   US Abdomen Limited Ruq  Result Date: 04/26/2017 CLINICAL DATA:  Right upper quadrant abdominal pain EXAM: ULTRASOUND ABDOMEN LIMITED RIGHT UPPER QUADRANT COMPARISON:  None. FINDINGS: Gallbladder: No gallstones or wall thickening visualized. No sonographic Murphy sign noted by sonographer. Common bile duct: Diameter: 5 mm Liver: No focal lesion identified. Coarse echogenic liver with poor sonic penetration compatible with diffuse hepatic steatosis. Portal vein is patent on color Doppler imaging with normal direction of blood flow towards the liver. IMPRESSION: 1. Coarse echogenic liver with poor sonic penetration compatible with diffuse hepatic steatosis. 2. Otherwise normal sonographic appearance of the hepatobiliary system. Electronically Signed   By: Van Clines M.D.   On: 04/26/2017 12:53    Discharge Exam: Vitals:   04/26/17 2030 04/27/17 0400  BP: 134/78 119/81  Pulse: 86 97  Resp: 20 20  Temp: (!) 97.5 F (36.4 C) 97.7 F (36.5 C)  SpO2: 99% 98%   Vitals:   04/26/17 1531 04/26/17 2030 04/27/17 0400 04/27/17 0437  BP: (!) 95/55 134/78 119/81   Pulse:  86 97   Resp: 16 20 20    Temp:  (!) 97.5 F (36.4 C) 97.7 F (36.5 C)   TempSrc:  Oral Oral   SpO2: 97% 99% 98%   Weight:    112.8 kg (248 lb 10.9 oz)  Height:    5\' 4"  (1.626 m)    General: Pt is alert, awake, not in acute distress Cardiovascular: RRR, S1/S2 +, no rubs, no gallops Respiratory: CTA bilaterally, no wheezing, no rhonchi, + chest wall tenderness Abdominal: Soft, NT, ND, bowel sounds + Extremities: no edema  The results of significant diagnostics from this hospitalization (including imaging, microbiology, ancillary and laboratory) are listed below for reference.     Microbiology: No results found  for this or any previous visit (from the past 240 hour(s)).   Labs: BNP (last 3 results) Recent Labs    04/26/17 0637  BNP 96.7   Basic Metabolic Panel: Recent Labs  Lab 04/26/17 0637 04/27/17 0412  NA 139 140  K 3.8 3.8  CL 102 103  CO2 26 27  GLUCOSE 109* 106*  BUN 17 12  CREATININE 0.71 0.76  CALCIUM 9.1 8.8*   Liver Function Tests: Recent Labs  Lab 04/26/17 1029 04/27/17 0412  AST 22 24  ALT 19 20  ALKPHOS 101 94  BILITOT 0.5 0.6  PROT 7.2 6.9  ALBUMIN 3.6 3.7   Recent Labs  Lab 04/26/17 1029  LIPASE 23   No results for input(s): AMMONIA in the last 168 hours. CBC: Recent Labs  Lab 04/26/17 0547 04/27/17  0412  WBC 6.5 8.4  HGB 10.1* 9.1*  HCT 32.0* 29.0*  MCV 82.9 84.3  PLT 359 319   Cardiac Enzymes: Recent Labs  Lab 04/26/17 1029 04/26/17 1449 04/26/17 2102  TROPONINI <0.03 <0.03 <0.03   BNP: Invalid input(s): POCBNP CBG: Recent Labs  Lab 04/27/17 0423 04/27/17 0736  GLUCAP 93 98   D-Dimer No results for input(s): DDIMER in the last 72 hours. Hgb A1c Recent Labs    04/26/17 1029  HGBA1C 5.8*   Lipid Profile Recent Labs    04/26/17 1029  CHOL 215*  HDL 55  LDLCALC 129*  TRIG 153*  CHOLHDL 3.9   Thyroid function studies Recent Labs    04/26/17 1029  TSH 1.528   Anemia work up No results for input(s): VITAMINB12, FOLATE, FERRITIN, TIBC, IRON, RETICCTPCT in the last 72 hours. Urinalysis    Component Value Date/Time   COLORURINE YELLOW 02/22/2013 1418   APPEARANCEUR CLOUDY (A) 02/22/2013 1418   LABSPEC 1.013 02/22/2013 1418   PHURINE 5.5 02/22/2013 1418   GLUCOSEU NEGATIVE 02/22/2013 1418   HGBUR NEGATIVE 02/22/2013 1418   BILIRUBINUR NEGATIVE 02/22/2013 1418   KETONESUR NEGATIVE 02/22/2013 1418   PROTEINUR NEGATIVE 02/22/2013 1418   UROBILINOGEN 0.2 02/22/2013 1418   NITRITE NEGATIVE 02/22/2013 1418   LEUKOCYTESUR NEGATIVE 02/22/2013 1418   Sepsis Labs Invalid input(s): PROCALCITONIN,  WBC,   LACTICIDVEN Microbiology No results found for this or any previous visit (from the past 240 hour(s)).   Time coordinating discharge: 35 minutes  SIGNED:  Chipper Oman, MD  [facility] 04/27/2017, 10:45 AM  Pager please text page via  [email or web address]  Note - This record has been created using [employer]. Chart creation errors have been sought, but may not always have been located. Such creation errors do not reflect on the standard of medical care.

## 2017-04-27 NOTE — Progress Notes (Signed)
RT placed patient on CPAP. Patient setting is 5-10 cmH2O with 4 liter bleed in. Patient seems to be tolerating well.

## 2017-05-01 ENCOUNTER — Telehealth: Payer: Self-pay

## 2017-05-01 NOTE — Telephone Encounter (Signed)
Message received from Delfina Redwood CM requesting a hospital follow up appointment at [facility].   Call placed to the patient and she said that she has not checked with her insurance company about a preferred provider but would like to schedule an appointment at [facility]. An appointment has been scheduled for 05/03/17 @ 0945.  Update provided to Orpha Bur, RN CM

## 2017-05-03 ENCOUNTER — Inpatient Hospital Stay: Payer: BLUE CROSS/BLUE SHIELD | Admitting: Family Medicine

## 2017-05-17 ENCOUNTER — Ambulatory Visit: Payer: BLUE CROSS/BLUE SHIELD | Attending: Critical Care Medicine | Admitting: Critical Care Medicine

## 2017-05-17 ENCOUNTER — Other Ambulatory Visit: Payer: Self-pay

## 2017-05-17 ENCOUNTER — Encounter: Payer: Self-pay | Admitting: Critical Care Medicine

## 2017-05-17 VITALS — BP 128/81 | HR 84 | Temp 97.7°F | Resp 18 | Ht 68.0 in | Wt 257.8 lb

## 2017-05-17 DIAGNOSIS — Z79899 Other long term (current) drug therapy: Secondary | ICD-10-CM | POA: Diagnosis not present

## 2017-05-17 DIAGNOSIS — G4733 Obstructive sleep apnea (adult) (pediatric): Secondary | ICD-10-CM | POA: Diagnosis not present

## 2017-05-17 DIAGNOSIS — R079 Chest pain, unspecified: Secondary | ICD-10-CM

## 2017-05-17 DIAGNOSIS — C4370 Malignant melanoma of unspecified lower limb, including hip: Secondary | ICD-10-CM | POA: Insufficient documentation

## 2017-05-17 DIAGNOSIS — G7000 Myasthenia gravis without (acute) exacerbation: Secondary | ICD-10-CM | POA: Diagnosis not present

## 2017-05-17 DIAGNOSIS — K219 Gastro-esophageal reflux disease without esophagitis: Secondary | ICD-10-CM | POA: Diagnosis not present

## 2017-05-17 DIAGNOSIS — Z888 Allergy status to other drugs, medicaments and biological substances status: Secondary | ICD-10-CM | POA: Insufficient documentation

## 2017-05-17 DIAGNOSIS — R600 Localized edema: Secondary | ICD-10-CM | POA: Diagnosis not present

## 2017-05-17 DIAGNOSIS — Z85820 Personal history of malignant melanoma of skin: Secondary | ICD-10-CM | POA: Insufficient documentation

## 2017-05-17 DIAGNOSIS — J4520 Mild intermittent asthma, uncomplicated: Secondary | ICD-10-CM | POA: Insufficient documentation

## 2017-05-17 DIAGNOSIS — C4372 Malignant melanoma of left lower limb, including hip: Secondary | ICD-10-CM

## 2017-05-17 DIAGNOSIS — G2581 Restless legs syndrome: Secondary | ICD-10-CM | POA: Insufficient documentation

## 2017-05-17 MED ORDER — HYDROCODONE-ACETAMINOPHEN 5-325 MG PO TABS: 1.0000 | ORAL_TABLET | Freq: Four times a day (QID) | ORAL | 0 refills | Status: DC | PRN

## 2017-05-17 MED ORDER — FAMOTIDINE 40 MG PO TABS: 40.0000 mg | ORAL_TABLET | Freq: Two times a day (BID) | ORAL | 6 refills | Status: DC

## 2017-05-17 MED ORDER — ALBUTEROL SULFATE HFA 108 (90 BASE) MCG/ACT IN AERS: 1.0000 | INHALATION_SPRAY | Freq: Four times a day (QID) | RESPIRATORY_TRACT | 6 refills | Status: DC | PRN

## 2017-05-17 NOTE — Assessment & Plan Note (Signed)
No evidence of active myasthenia Needs Neurology to f/u

## 2017-05-17 NOTE — Patient Instructions (Addendum)
Refill on pepcid, albuterol and hydrocodone sent to [facility] pharmacy A referral to [facility] will be made for myasthenia and sleep apnea A referral for primary care will be made to [place] [facility]

## 2017-05-17 NOTE — Assessment & Plan Note (Signed)
Left leg edema from prior lymph node dissection

## 2017-05-17 NOTE — Assessment & Plan Note (Signed)
Restless leg syndrome Continue mirapex

## 2017-05-17 NOTE — Progress Notes (Signed)
Subjective:    Patient ID: [name], female    DOB: [birth year], 57 y.o.   MRN: [id number]  [name] is a 57 year old female with medical history significant for restless leg syndrome, metastatic melanoma stage III, asthma, GERD and hiatal hernia presented to the hospital complaining of chest pain 04/26/17 and d/c 04/27/17.  Pain was nonspecific and was associated with anxiety and shortness of breath.  Upon ED evaluation patient was found to be very anxious and reporting severe chest pain.  Cardiac enzymes and EKG were obtained which were negative.  Patient was found to have slight elevated blood pressure and tachycardia therefore she was admitted for chest pain rule out ACS.  Cardiology was consulted and recommended patient to have a CTA to rule out PE and dissection of the aorta, CTA was negative.  EKGs and troponin remained normal.  Patient was also started on IV Protonix for possible gastritis.  Patient has significantly improved.  Pain felt to be related to anxiety and possible GERD.  Patient was deemed stable for discharge and follow-up as an outpatient.   Per discharge summary below: Atypical chest pain - ACS essentially rule out as there was no EKG changes, troponins negative and chest tender with palpation.  Patient may need stress test as an outpatient. CT angiogram does not show any our dissection or PE. Cardiology consulted did not felt to be acute coronary syndrome Elevated LDL and total cholesterol, discussed with patient medical treatment versus lifestyle modification.  For now patient will try lifestyle modification recheck in 3 months. Chest pain felt to be related to anxiety and GERD  Uncontrolled restless leg syndrome Due to non-medication compliance. Her home medications were restarted and symptoms has significantly improved Continue home medication, advised to establish care with neurology for further management in outpatient setting.  GERD with large hiatal  hernia She was treated with Protonix IV which relieved some of the reflux Switched PPI's to Pepcid 40 mg twice daily. Diet discussed with patient  Chronic back pain status post surgery Underwent PLIF on 03/01/17.  Continue pain control  Metastatic melanoma stage III Patient recently moved from [place] for the cancer center has been placed on discharge instruction for patient to follow-up as an outpatient.   OSA Not on CPAP at home Need outpatient studies for possible CPAP machine.    Pt is here to f/u and also needs to establish with a PCP.     Prior back surgery 03/01/17: Dr Saintclair Halsted, lower back L3/4  L 4/5  L 5/6 fusion/rods.      Chest Pain   This is a recurrent problem. The current episode started 1 to 4 weeks ago. The onset quality is gradual (pain came on suddenly and was in anterior of the chest). The problem occurs constantly. The problem has been gradually improving. The pain is present in the substernal region and epigastric region. The pain is at a severity of 5/10. The pain is moderate. The quality of the pain is described as heavy and pressure. The pain radiates to the right shoulder, left shoulder and precordial region. Associated symptoms include abdominal pain, back pain, a cough, leg pain, lower extremity edema, orthopnea, PND and shortness of breath. Pertinent negatives include no claudication, diaphoresis, exertional chest pressure, fever, headaches, hemoptysis, irregular heartbeat, malaise/fatigue, near-syncope, numbness, palpitations, sputum production, syncope or vomiting. The cough is non-productive. The cough is worsened by a supine position. She has tried antacids for the symptoms. The treatment provided no relief. Risk factors  include obesity.  Her past medical history is significant for cancer, DVT, hyperlipidemia and sleep apnea.  Pertinent negatives for past medical history include no aneurysm, no aortic aneurysm, no aortic dissection, no  arrhythmia, no bicuspid aortic valve, no CAD, no hypertension, no PE, no rheumatic fever, no seizures, no spontaneous pneumothorax, no strokes and no thyroid problem. Past medical history comments: melanoma Stage III,  was in remission.  Not able to get into the cancer center Prior diagnostic workup includes chest x-ray.   Past Medical History:  Diagnosis Date  . Anemia   . Arthritis    back & knees   . Asthma    uses albuterol inhaler if outside, cold weather & exercising   . GERD (gastroesophageal reflux disease)   . History of hiatal hernia   . History of kidney stones    passed spontaneously  . Melanoma ([place])    malignant, left leg,  wide excision, sentinel node, one positive stage 3, as a result- lymphdema, will be following with ONCOLOGY, PET scan- last 2017  . Myasthenia gravis ([place])   . Pneumonia 2015   hosp. [facility] , treated with IV antibiotics   . Sleep apnea    sleep irregular pattern- no CPAP order solidified yet.      Family History  Problem Relation Age of Onset  . COPD Mother   . Lung cancer Father   . Diverticulitis Father   . Colitis Neg Hx      Social History   Socioeconomic History  . Marital status: Married    Spouse name: Not on file  . Number of children: 2  . Years of education: Not on file  . Highest education level: Not on file  Social Needs  . Financial resource strain: Not on file  . Food insecurity - worry: Not on file  . Food insecurity - inability: Not on file  . Transportation needs - medical: Not on file  . Transportation needs - non-medical: Not on file  Occupational History  . Occupation: clinical research  Tobacco Use  . Smoking status: Never Smoker  . Smokeless tobacco: Never Used  Substance and Sexual Activity  . Alcohol use: Yes    Alcohol/week: 0.5 oz    Types: 1 Standard drinks or equivalent per week    Comment: occassionally  . Drug use: No  . Sexual activity: Yes    Birth control/protection: None  Other Topics  Concern  . Not on file  Social History Narrative   ** Merged History Encounter **         Allergies  Allergen Reactions  . Other Other (See Comments)    Cant take some "mycin" drugs   . Tape Other (See Comments)    Blister, irritates skin     Outpatient Medications Prior to Visit  Medication Sig Dispense Refill  . nabumetone (RELAFEN) 500 MG tablet Take 500 mg by mouth 2 (two) times daily as needed.     . pramipexole (MIRAPEX) 1.5 MG tablet Take 4.5 mg by mouth 3 (three) times daily as needed (for restless leg syndrome).     . tapentadol HCl (NUCYNTA) 75 MG tablet Take 75 mg by mouth every 4 (four) hours as needed for severe pain.     Marland Kitchen tiZANidine (ZANAFLEX) 4 MG tablet Take 1 tablet (4 mg total) by mouth every 6 (six) hours as needed for muscle spasms (if unresponsive to cyclobenzaprine). (Patient taking differently: Take 4-6 mg by mouth every 6 (six) hours as  needed for muscle spasms (if unresponsive to cyclobenzaprine). ) 30 tablet 0  . zolpidem (AMBIEN CR) 12.5 MG CR tablet Take 12.5 mg by mouth at bedtime as needed for sleep.    . famotidine (PEPCID) 40 MG tablet Take 1 tablet (40 mg total) by mouth 2 (two) times daily. 60 tablet 0  . gabapentin (NEURONTIN) 300 MG capsule Take 300 mg by mouth at bedtime.    Marland Kitchen HYDROcodone-acetaminophen (NORCO/VICODIN) 5-325 MG tablet Take 1 tablet by mouth every 6 (six) hours as needed for moderate pain. 20 tablet 0  . albuterol (PROVENTIL HFA;VENTOLIN HFA) 108 (90 Base) MCG/ACT inhaler Inhale 1 puff into the lungs every 6 (six) hours as needed for wheezing or shortness of breath.     No facility-administered medications prior to visit.       Review of Systems  Constitutional: Negative for diaphoresis, fever and malaise/fatigue.  Respiratory: Positive for cough and shortness of breath. Negative for hemoptysis and sputum production.   Cardiovascular: Positive for chest pain, orthopnea and PND. Negative for palpitations, claudication, syncope and  near-syncope.  Gastrointestinal: Positive for abdominal pain. Negative for vomiting.  Musculoskeletal: Positive for back pain.  Neurological: Negative for seizures, numbness and headaches.       Objective:   Physical Exam Vitals:   05/17/17 1014  BP: 128/81  Pulse: 84  Resp: 18  Temp: 97.7 F (36.5 C)  TempSrc: Oral  SpO2: 94%  Weight: 257 lb 12.8 oz (116.9 kg)  Height: 5\' 8"  (1.727 m)    Gen: Pleasant, well-nourished, in no distress,  normal affect  ENT: No lesions,  mouth clear,  oropharynx clear, no postnasal drip  Neck: No JVD, no TMG, no carotid bruits  Lungs: No use of accessory muscles, no dullness to percussion, clear without rales or rhonchi  Cardiovascular: RRR, heart sounds normal, no murmur or gallops, no peripheral edema  Abdomen: soft and NT, no HSM,  BS normal  Musculoskeletal: No deformities, no cyanosis or clubbing  Neuro: alert, non focal  Skin: Warm, no lesions or rashes  CT Chest Angio 04/26/17:  Neg for PE  IMPRESSION: 1. No aortic dissection or other acute abnormality identified in the chest, abdomen, or pelvis. 2. Uterine fibroids.         Assessment & Plan:  I personally reviewed all images and lab data in the [facility] system as well as any outside material available during this office visit and agree with the  radiology impressions.   GERD GERD with ongoing dyspepsia Plan  Refill Pepcid 40mg  bid  MYASTHENIA GRAVIS WITHOUT EXACERBATION No evidence of active myasthenia Needs Neurology to f/u   Asthma Stable asthma  Plan  Prn Saba Refill saba  Chest pain Chest pain syndrome resolved Plan  Observation   Malignant melanoma of thigh, left (HCC) Stage III Hx of malignant melanoma Stage III Needs to establish with oncololgy   Leg edema Left leg edema from prior lymph node dissection   RESTLESS LEG SYNDROME Restless leg syndrome Continue mirapex    [name] was seen today for follow-up.  Diagnoses and all orders for  this visit:  Malignant melanoma of thigh, left ([place]) Stage III -     Ambulatory referral to [place] -     Ambulatory referral to Neurology  Obstructive sleep apnea syndrome -     Ambulatory referral to Neurology  Gastroesophageal reflux disease without esophagitis  Mild intermittent asthma without complication  Chest pain, unspecified type  Leg edema  RESTLESS LEG SYNDROME  Other orders -     famotidine (PEPCID) 40 MG tablet; Take 1 tablet (40 mg total) by mouth 2 (two) times daily. -     albuterol (PROVENTIL HFA;VENTOLIN HFA) 108 (90 Base) MCG/ACT inhaler; Inhale 1 puff into the lungs every 6 (six) hours as needed for wheezing or shortness of breath. -     HYDROcodone-acetaminophen (NORCO/VICODIN) 5-325 MG tablet; Take 1 tablet by mouth every 6 (six) hours as needed for moderate pain.

## 2017-05-17 NOTE — Assessment & Plan Note (Signed)
Chest pain syndrome resolved Plan  Observation

## 2017-05-17 NOTE — Assessment & Plan Note (Addendum)
Stable asthma  Plan  Prn Saba Refill saba

## 2017-05-17 NOTE — Progress Notes (Signed)
Lower back pain & both knee pain

## 2017-05-17 NOTE — Assessment & Plan Note (Signed)
Hx of malignant melanoma Stage III Needs to establish with oncololgy

## 2017-05-17 NOTE — Assessment & Plan Note (Signed)
GERD with ongoing dyspepsia Plan  Refill Pepcid 40mg  bid

## 2017-05-18 ENCOUNTER — Ambulatory Visit (INDEPENDENT_AMBULATORY_CARE_PROVIDER_SITE_OTHER): Payer: BLUE CROSS/BLUE SHIELD | Admitting: Physician Assistant

## 2017-05-18 ENCOUNTER — Encounter: Payer: Self-pay | Admitting: Physician Assistant

## 2017-05-18 VITALS — BP 140/76 | HR 104 | Ht 66.14 in | Wt 260.5 lb

## 2017-05-18 DIAGNOSIS — K219 Gastro-esophageal reflux disease without esophagitis: Secondary | ICD-10-CM | POA: Diagnosis not present

## 2017-05-18 MED ORDER — PANTOPRAZOLE SODIUM 40 MG PO TBEC: DELAYED_RELEASE_TABLET | ORAL | 11 refills | Status: DC

## 2017-05-18 NOTE — Patient Instructions (Addendum)
We have sent the following medications to your pharmacy for you to pick up at your convenience: Please call in a few weeks if no improvement.  May use Mylanta as needed.  Normal BMI (Body Mass Index- based on height and weight) is between 19 and 25. Your BMI today is Body mass index is 41.87 kg/m. Marland Kitchen Please consider follow up  regarding your BMI with your Primary Care Provider.

## 2017-05-18 NOTE — Progress Notes (Signed)
Subjective:    Patient ID: [name], female    DOB: 1960/09/16, 57 y.o.   MRN: [id number]  HPI [name] is a pleasant 57 year old white female, known to Dr. Henrene Pastor.  She comes in today with complaints of burning chest pain.  She had an ER visit and then short admission 04/26/2017 when she presented with epigastric and chest pain.  She underwent cardiac workup to rule out acute coronary syndrome and this was negative.  She also had CTA to rule out PE. Patient does have history of chronic GERD, myasthenia gravis, is status post a back surgery in early January 2019 from which she is recuperating.  Also with restless leg syndrome and metastatic melanoma stage III. She says she had been on omeprazole 20 mg p.o. once daily for control of GERD but with recent hospitalization this was stopped and she was switched to Pepcid 40 mg twice daily.  She says she has been having ongoing fairly constant burning from her stomach up into her throat over the past few weeks.  She says really symptoms have been worse since her back surgery in January.  She says if she lays down for any period of time she will wake up choking and coughing.  She also has some sour brash symptoms during the day.  No complaints of dysphagia or odynophagia. No current complaints of abdominal pain. Upper abdominal ultrasound was done on 04/26/2017 with finding of hepatic steatosis otherwise negative. She had recent EGD with Dr. Henrene Pastor 01/27/2017 showing a large hiatal hernia 7 cm and an early distal stricture which was not dilated.  Colonoscopy done at that same time was normal.  Review of Systems.Pertinent positive and negative review of systems were noted in the above HPI section.  All other review of systems was otherwise negative.  Outpatient Encounter Medications as of 05/18/2017  Medication Sig  . albuterol (PROVENTIL HFA;VENTOLIN HFA) 108 (90 Base) MCG/ACT inhaler Inhale 1 puff into the lungs every 6 (six) hours as needed for  wheezing or shortness of breath.  . famotidine (PEPCID) 40 MG tablet Take 1 tablet (40 mg total) by mouth 2 (two) times daily.  Marland Kitchen HYDROcodone-acetaminophen (NORCO/VICODIN) 5-325 MG tablet Take 1 tablet by mouth every 6 (six) hours as needed for moderate pain.  . nabumetone (RELAFEN) 500 MG tablet Take 500 mg by mouth 2 (two) times daily as needed.   . pramipexole (MIRAPEX) 1.5 MG tablet Take 4.5 mg by mouth 3 (three) times daily as needed (for restless leg syndrome).   . tapentadol HCl (NUCYNTA) 75 MG tablet Take 75 mg by mouth every 4 (four) hours as needed for severe pain.   Marland Kitchen tiZANidine (ZANAFLEX) 4 MG tablet Take 1 tablet (4 mg total) by mouth every 6 (six) hours as needed for muscle spasms (if unresponsive to cyclobenzaprine). (Patient taking differently: Take 4-6 mg by mouth every 6 (six) hours as needed for muscle spasms (if unresponsive to cyclobenzaprine). )  . zolpidem (AMBIEN CR) 12.5 MG CR tablet Take 12.5 mg by mouth at bedtime as needed for sleep.  . pantoprazole (PROTONIX) 40 MG tablet Take twice daily for 3 months, then daily   No facility-administered encounter medications on file as of 05/18/2017.    Allergies  Allergen Reactions  . Other Other (See Comments)    Cant take some "mycin" drugs   . Tape Other (See Comments)    Blister, irritates skin   Patient Active Problem List   Diagnosis Date Noted  . Spondylolisthesis at L3-L4  level 03/01/2017  . Allergy to environmental factors 09/15/2016  . Chronic low back pain 09/15/2016  . Degenerative spondylolisthesis 09/15/2016  . Depression 09/15/2016  . Leg edema 09/15/2016  . Lumbosacral spondylosis without myelopathy 09/15/2016  . Osteoarthritis of knees, bilateral 09/15/2016  . Pernicious anemia 09/15/2016  . 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 Malignant melanoma of thigh, left ([place]) Stage III 11/05/2013  . CERVICAL RADICULOPATHY, LEFT 06/04/2008  . MEDIAL EPICONDYLITIS 06/04/2008    . ANEMIA-NOS 03/25/2008  . ALLERGIC RHINITIS 03/25/2008  . Asthma 03/25/2008  . FATIGUE 03/25/2008  . B12 DEFICIENCY 06/04/2007  . RESTLESS LEG SYNDROME 06/04/2007  . MYASTHENIA GRAVIS WITHOUT EXACERBATION 06/04/2007  . GERD 06/04/2007  . IRRITABLE BOWEL SYNDROME 06/04/2007   Social History   Socioeconomic History  . Marital status: Married    Spouse name: Not on file  . Number of children: 2  . Years of education: Not on file  . Highest education level: Not on file  Occupational History  . Occupation: clinical research  Social Needs  . Financial resource strain: Not on file  . Food insecurity:    Worry: Not on file    Inability: Not on file  . Transportation needs:    Medical: Not on file    Non-medical: Not on file  Tobacco Use  . Smoking status: Never Smoker  . Smokeless tobacco: Never Used  Substance and Sexual Activity  . Alcohol use: Yes    Alcohol/week: 0.5 oz    Types: 1 Standard drinks or equivalent per week    Comment: occassionally  . Drug use: No  . Sexual activity: Yes    Birth control/protection: None  Lifestyle  . Physical activity:    Days per week: Not on file    Minutes per session: Not on file  . Stress: Not on file  Relationships  . Social connections:    Talks on phone: Not on file    Gets together: Not on file    Attends religious service: Not on file    Active member of club or organization: Not on file    Attends meetings of clubs or organizations: Not on file    Relationship status: Not on file  . Intimate partner violence:    Fear of current or ex partner: Not on file    Emotionally abused: Not on file    Physically abused: Not on file    Forced sexual activity: Not on file  Other Topics Concern  . Not on file  Social History Narrative   ** Merged History Encounter **        [name]'s family history includes COPD in her mother; Diverticulitis in her father; Lung cancer in her father.      Objective:    Vitals:    05/18/17 0832  BP: 140/76  Pulse: (!) 104    Physical Exam; well-developed white female in no acute distress, pleasant blood pressure 140/76 pulse 104 height 5 foot 6, weight 260, BMI 41.8.  HEENT ;nontraumatic normocephalic EOMI PERRLA sclera anicteric, Cardiovascular ;regular rate and rhythm with S1-S2 no murmur rub or gallop, Pulmonary ;clear bilaterally, Abdomen ;soft, obese nontender nondistended bowel sounds are active no palpable mass or hepatosplenomegaly, Rectal ;exam not done, Extremities ;no clubbing cyanosis or edema skin warm and dry, Neuro psych; mood and affect appropriate       Assessment & Plan:   #71 57 year old female with chronic GERD and known large hiatal hernia with early controlled  symptoms over the past 2 months likely multifactorial with recent back surgery, and then change in medication from low-dose PPI to H2 blocker which has been ineffective. She may have a component of esophagitis  #2 colon cancer surveillance-up-to-date with negative colonoscopy November 2018 #3 hepatic steatosis #4 obesity #5.  Myasthenia gravis  #6 asthma #7.  Restless leg syndrome #8.  History of metastatic melanoma stage III  Plan; Reviewed a strict antireflux regimen, right positioning postprandially and use of a wedge pillow to keep back elevated 45 degrees at nighttime.  She has had a hard time with this recently due to back surgery Start Protonix 40 mg p.o. twice daily before meals breakfast and before meals dinner.  She will use this over the next 2-3 months and when symptoms are under good control decrease to once daily before meals dinner She may also use Mylanta ,30cc,on a as needed basis over the next several days. She is asked to call back if she has not had significant improvement in her symptoms over the next couple of weeks. She will follow-up with Dr. Henrene Pastor or myself in 1 year or sooner as needed.  Xochilth Standish S Mckinzie Saksa PA-C 05/18/2017   Cc: No ref. provider found

## 2017-05-23 NOTE — Progress Notes (Signed)
Assessment and plans reviewed  

## 2017-06-15 ENCOUNTER — Other Ambulatory Visit: Payer: Self-pay | Admitting: Family

## 2017-06-15 ENCOUNTER — Inpatient Hospital Stay: Payer: BLUE CROSS/BLUE SHIELD | Attending: Hematology & Oncology | Admitting: Family

## 2017-06-15 ENCOUNTER — Encounter: Payer: Self-pay | Admitting: Family

## 2017-06-15 ENCOUNTER — Other Ambulatory Visit: Payer: Self-pay

## 2017-06-15 ENCOUNTER — Inpatient Hospital Stay: Payer: BLUE CROSS/BLUE SHIELD

## 2017-06-15 VITALS — BP 147/83 | HR 80 | Temp 98.0°F | Resp 17 | Wt 237.0 lb

## 2017-06-15 DIAGNOSIS — Z85820 Personal history of malignant melanoma of skin: Secondary | ICD-10-CM | POA: Diagnosis not present

## 2017-06-15 DIAGNOSIS — Z801 Family history of malignant neoplasm of trachea, bronchus and lung: Secondary | ICD-10-CM | POA: Insufficient documentation

## 2017-06-15 DIAGNOSIS — I890 Lymphedema, not elsewhere classified: Secondary | ICD-10-CM | POA: Insufficient documentation

## 2017-06-15 DIAGNOSIS — Z803 Family history of malignant neoplasm of breast: Secondary | ICD-10-CM

## 2017-06-15 DIAGNOSIS — C4372 Malignant melanoma of left lower limb, including hip: Secondary | ICD-10-CM

## 2017-06-15 DIAGNOSIS — D649 Anemia, unspecified: Secondary | ICD-10-CM | POA: Diagnosis not present

## 2017-06-15 DIAGNOSIS — D508 Other iron deficiency anemias: Secondary | ICD-10-CM

## 2017-06-15 DIAGNOSIS — Z800 Family history of malignant neoplasm of digestive organs: Secondary | ICD-10-CM | POA: Diagnosis not present

## 2017-06-15 LAB — CMP (CANCER CENTER ONLY)
ALT: 17 U/L (ref 0–55)
ANION GAP: 9 (ref 3–11)
AST: 15 U/L (ref 5–34)
Albumin: 3.7 g/dL (ref 3.5–5.0)
Alkaline Phosphatase: 134 U/L (ref 40–150)
BUN: 12 mg/dL (ref 7–26)
CALCIUM: 9.8 mg/dL (ref 8.4–10.4)
CHLORIDE: 105 mmol/L (ref 98–109)
CO2: 28 mmol/L (ref 22–29)
Creatinine: 0.79 mg/dL (ref 0.60–1.10)
GFR, Estimated: >60 mL/min (ref >60)
Glucose, Bld: 90 mg/dL (ref 70–140)
Potassium: 4 mmol/L (ref 3.5–5.1)
SODIUM: 142 mmol/L (ref 136–145)
Total Bilirubin: 0.3 mg/dL (ref 0.2–1.2)
Total Protein: 8 g/dL (ref 6.4–8.3)

## 2017-06-15 LAB — CBC WITH DIFFERENTIAL (CANCER CENTER ONLY)
Basophils Absolute: 0.1 10*3/uL (ref 0.0–0.1)
Basophils Relative: 1 %
EOS ABS: 0 10*3/uL (ref 0.0–0.5)
EOS PCT: 0 %
HCT: 31.3 % — ABNORMAL LOW (ref 34.8–46.6)
Hemoglobin: 9.8 g/dL — ABNORMAL LOW (ref 11.6–15.9)
LYMPHS ABS: 1.8 10*3/uL (ref 0.9–3.3)
Lymphocytes Relative: 17 %
MCH: 24.9 pg — AB (ref 26.0–34.0)
MCHC: 31.3 g/dL — AB (ref 32.0–36.0)
MCV: 79.6 fL — ABNORMAL LOW (ref 81.0–101.0)
Monocytes Absolute: 0.7 10*3/uL (ref 0.1–0.9)
Monocytes Relative: 7 %
Neutro Abs: 7.8 10*3/uL — ABNORMAL HIGH (ref 1.5–6.5)
Neutrophils Relative %: 75 %
PLATELETS: 465 10*3/uL — AB (ref 145–400)
RBC: 3.93 MIL/uL (ref 3.70–5.32)
RDW: 15.8 % — AB (ref 11.1–15.7)
WBC Count: 10.3 10*3/uL — ABNORMAL HIGH (ref 3.9–10.0)

## 2017-06-15 LAB — LACTATE DEHYDROGENASE: LDH: 225 U/L (ref 125–245)

## 2017-06-15 NOTE — Progress Notes (Signed)
Hematology/Oncology Consultation   Name: [name]      MRN: [id number]    Location: Room/bed info not found  Date: 06/15/2017 Time:2:01 PM   REFERRING PHYSICIAN: Burnett Harry. Joya Gaskins, MD  REASON FOR CONSULT: Malignant melanoma of the left thigh   DIAGNOSIS: Malignant melanoma of the left thigh, stage IIIb, one positive lymph node  HISTORY OF PRESENT ILLNESS: [name] is a very pleasant 57 yo caucasian female with history of metastatic malignant melanoma stage IIIb of the left posterior thigh, one positive lymph node diagnosed in September 2015 with excision. Maximum tumor thickness was 1.84 mm, excision was 2.5 cm deep. She did not have chemotherapy as she felt at that time the risk outweighed the benefit.  She has also had melanomas of the right calf and left upper arm removed by Mohs procedure.  She states that her last PET scan was in 2017 and that she has had genetic testing done. She will bring [name] both of these reports as soon as she can.  She has chronic lymphedema of both lower extremities and has noticeable fluid in the left side of her face. No JVD noted. No lymphadenopathy found on exam.  She states that she needs new compression stockings.  She sees a PT specializing in lymphedema massage. She would like a referral. She also uses a lymphedema machine at home at night.  No other personal cancer history. Family cancer history is quite extensive. Her mother had 11 siblings all of whom passed away from various cancers including lung, breast and colon. Maternal grandmother had lymphoma. Father and paternal uncle had lung, paternal uncle and aunt had pancreatic cancer, paternal grandmother had lymphoma and paternal grandfather had an unknown primary.   She states that her mammogram was in 2018 and negative.   She had her endoscopy and colonoscopy in November 2018 which were negative.  In January, she had posterior lumbar fusion of L3-L5 with rads ands crews placed. She states that  she has intermittent  numbness and tingling in her feet due to her back issues.  She has had no issue with infections. No fever, chills, n/v, cough, rash, dizziness, chest pain, abdominal pain or changes in bowel or bladder habits.  She has occasional SOB due to asthma. She also has occasional palpitations due to anxiety and stress with the move and trying to find a new job.  She is originally from this area but recently moved back from [place] when she lost her job.   She has a good appetite and is staying well hydrated. Her weight is stable.  She is not a smoker and does not drink.   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  ROS: All other 10 point review of systems is negative.   PAST MEDICAL HISTORY:   Past Medical History:  Diagnosis Date  . Anemia   . Arthritis    back & knees   . Asthma    uses albuterol inhaler if outside, cold weather & exercising   . GERD (gastroesophageal reflux disease)   . History of hiatal hernia   . History of kidney stones    passed spontaneously  . Melanoma ([place])    malignant, left leg,  wide excision, sentinel node, one positive stage 3, as a result- lymphdema, will be following with ONCOLOGY, PET scan- last 2017  . Myasthenia gravis ([place])   . Pneumonia 2015   hosp. [facility] , treated with IV antibiotics   . Sleep apnea  sleep irregular pattern- no CPAP order solidified yet.     ALLERGIES: Allergies  Allergen Reactions  . Other Other (See Comments)    Cant take some "mycin" drugs   . Tape Other (See Comments)    Blister, irritates skin      MEDICATIONS:  Current Outpatient Medications on File Prior to Visit  Medication Sig Dispense Refill  . albuterol (PROVENTIL HFA;VENTOLIN HFA) 108 (90 Base) MCG/ACT inhaler Inhale 1 puff into the lungs every 6 (six) hours as needed for wheezing or shortness of breath. 1 Inhaler 6  . HYDROcodone-acetaminophen (NORCO/VICODIN) 5-325 MG tablet Take 1 tablet by mouth every 6 (six)  hours as needed for moderate pain. 30 tablet 0  . nabumetone (RELAFEN) 500 MG tablet Take 500 mg by mouth 2 (two) times daily as needed.     . pantoprazole (PROTONIX) 40 MG tablet Take twice daily for 3 months, then daily 60 tablet 11  . pramipexole (MIRAPEX) 1.5 MG tablet Take 4.5 mg by mouth 3 (three) times daily as needed (for restless leg syndrome).     . tapentadol HCl (NUCYNTA) 75 MG tablet Take 75 mg by mouth every 4 (four) hours as needed for severe pain.     Marland Kitchen tiZANidine (ZANAFLEX) 4 MG tablet Take 1 tablet (4 mg total) by mouth every 6 (six) hours as needed for muscle spasms (if unresponsive to cyclobenzaprine). (Patient taking differently: Take 4-6 mg by mouth every 6 (six) hours as needed for muscle spasms (if unresponsive to cyclobenzaprine). ) 30 tablet 0  . zolpidem (AMBIEN CR) 12.5 MG CR tablet Take 12.5 mg by mouth at bedtime as needed for sleep.     No current facility-administered medications on file prior to visit.      PAST SURGICAL HISTORY Past Surgical History:  Procedure Laterality Date  . APPENDECTOMY    . BREAST SURGERY Bilateral 2004   reduction   . EYE SURGERY Bilateral 1999   corneal peel  . MELANOMA EXCISION Left    removed from left leg  . OOPHORECTOMY Left   . THYMECTOMY  2001  . TOTAL KNEE ARTHROPLASTY Left 01/2016  . WRIST SURGERY Left    plate & 6 screws     FAMILY HISTORY: Family History  Problem Relation Age of Onset  . COPD Mother   . Lung cancer Father   . Diverticulitis Father   . Colitis Neg Hx     SOCIAL HISTORY:  reports that she has never smoked. She has never used smokeless tobacco. She reports that she drinks about 0.5 oz of alcohol per week. She reports that she does not use drugs.  PERFORMANCE STATUS: The patient's performance status is 1 - Symptomatic but completely ambulatory  PHYSICAL EXAM: Most Recent Vital Signs: Blood pressure (!) 147/83, pulse 80, temperature 98 F (36.7 C), temperature source Oral, resp. rate 17,  weight 237 lb (107.5 kg), SpO2 100 %. BP (!) 147/83 (BP Location: Left Arm, Patient Position: Sitting)   Pulse 80   Temp 98 F (36.7 C) (Oral)   Resp 17   Wt 237 lb (107.5 kg)   SpO2 100%   BMI 38.09 kg/m    General Appearance:    Alert, cooperative, no distress, appears stated age  Head:    Normocephalic, without obvious abnormality, atraumatic  Eyes:    PERRL, conjunctiva/corneas clear, EOM's intact, fundi    benign, both eyes        Throat:   Lips, mucosa, and tongue normal; teeth and gums  normal  Neck:   Supple, symmetrical, trachea midline, no adenopathy;    thyroid:  no enlargement/tenderness/nodules; no carotid   bruit or JVD  Back:     Symmetric, no curvature, ROM normal, no CVA tenderness  Lungs:     Clear to auscultation bilaterally, respirations unlabored  Chest Wall:    No tenderness or deformity   Heart:    Regular rate and rhythm, S1 and S2 normal, no murmur, rub   or gallop     Abdomen:     Soft, non-tender, bowel sounds active all four quadrants,    no masses, no organomegaly        Extremities:   Extremities normal, atraumatic, no cyanosis or edema  Pulses:   2+ and symmetric all extremities  Skin:   Skin color, texture, turgor normal, no rashes or lesions  Lymph nodes:   Cervical, supraclavicular, and axillary nodes normal  Neurologic:   CNII-XII intact, normal strength, sensation and reflexes    throughout    LABORATORY DATA:  Results for orders placed or performed in visit on 06/15/17 (from the past 48 hour(s))  CBC with Differential (Cancer Center Only)     Status: Abnormal   Collection Time: 06/15/17  1:38 PM  Result Value Ref Range   WBC Count 10.3 (H) 3.9 - 10.0 K/uL   RBC 3.93 3.70 - 5.32 MIL/uL   Hemoglobin 9.8 (L) 11.6 - 15.9 g/dL   HCT 31.3 (L) 34.8 - 46.6 %   MCV 79.6 (L) 81.0 - 101.0 fL   MCH 24.9 (L) 26.0 - 34.0 pg   MCHC 31.3 (L) 32.0 - 36.0 g/dL   RDW 15.8 (H) 11.1 - 15.7 %   Platelet Count 465 (H) 145 - 400 K/uL   Neutrophils  Relative % 75 %   Neutro Abs 7.8 (H) 1.5 - 6.5 K/uL   Lymphocytes Relative 17 %   Lymphs Abs 1.8 0.9 - 3.3 K/uL   Monocytes Relative 7 %   Monocytes Absolute 0.7 0.1 - 0.9 K/uL   Eosinophils Relative 0 %   Eosinophils Absolute 0.0 0.0 - 0.5 K/uL   Basophils Relative 1 %   Basophils Absolute 0.1 0.0 - 0.1 K/uL    Comment: Performed at [facility] at [facility], [address], [place], [place] [address]      RADIOGRAPHY: No results found.     PATHOLOGY: None   ASSESSMENT/PLAN: Ms. Venneman is a very pleasant 57 yo caucasian female with history of metastatic malignant melanoma stage III of the left posterior thigh, one positive lymph node diagnosed in September 2015 with excision. She also had melanomas removed from her right calf and left upper arm with Moh's surgery.  She did not have chemotherapy.  She will obtain her prior genetics and PET scan reports for [name].  We will get a PET scan within the next week or so.  Referrals were placed for PT (lymphedema) and dermatology.  Prescription for compression stockings given to patient.  We will see what her iron studies show and bring her back in for infusion if needed.  We will go ahead and plan to see her back in another 6 months for follow-up.   All questions were answered and she is in agreement with the plan. She will contact our office with any questions or concerns. We can certainly her sooner if need be.  She was discussed with and also seen by Dr. Marin Olp and he is in agreement  with the aforementioned.   Laverna Peace     Addendum: I saw and examined the patient with Judson Roch.  I agree with the above assessment.  We need to get some more information about her melanoma.   I really needs to see if this is.  I think this will BRAF mutant help me out.  She had her melanoma resected 4 years ago.  As such, there clearly is no role for adjuvant therapy at this time.  She is at risk for recurrence  because of the positive lymph node.  The Breslow's depth was 1.56 mm.  Thankfully, this is not too deep.  She is quite anemic.Her MCV is low. We will check iron studies on her her platelet count is high so I suspect that she is iron deficient.  If she is, then I would give her some IV iron.  I know that she has had a couple other melanomas but these were very, very thin melanomas which is typical for new melanomas in a patient with past melanoma.  I do think a PET scan would be quite helpful.  We spent about 45 minutes with Ms. Reigel.  She is very nice.  She definitely has a very good sense of humor.  We will plan to see her back in 6 months unless something comes up earlier or if there is a problem with the PET scan.  Lattie Haw, MD

## 2017-06-20 LAB — IRON AND TIBC
IRON: 20 ug/dL — AB (ref 41–142)
SATURATION RATIOS: 5 % — AB (ref 21–57)
TIBC: 425 ug/dL (ref 236–444)
UIBC: 405 ug/dL

## 2017-06-20 LAB — FERRITIN: FERRITIN: 17 ng/mL (ref 9–269)

## 2017-06-21 ENCOUNTER — Other Ambulatory Visit: Payer: Self-pay | Admitting: Family

## 2017-06-21 DIAGNOSIS — D508 Other iron deficiency anemias: Secondary | ICD-10-CM

## 2017-06-21 DIAGNOSIS — D509 Iron deficiency anemia, unspecified: Secondary | ICD-10-CM | POA: Insufficient documentation

## 2017-06-22 ENCOUNTER — Ambulatory Visit: Payer: BLUE CROSS/BLUE SHIELD | Attending: Family | Admitting: Physical Therapy

## 2017-06-22 ENCOUNTER — Other Ambulatory Visit: Payer: Self-pay

## 2017-06-22 ENCOUNTER — Encounter: Payer: Self-pay | Admitting: Physical Therapy

## 2017-06-22 ENCOUNTER — Encounter: Payer: Self-pay | Admitting: Hematology & Oncology

## 2017-06-22 DIAGNOSIS — R262 Difficulty in walking, not elsewhere classified: Secondary | ICD-10-CM | POA: Insufficient documentation

## 2017-06-22 DIAGNOSIS — M79605 Pain in left leg: Secondary | ICD-10-CM | POA: Insufficient documentation

## 2017-06-22 DIAGNOSIS — I890 Lymphedema, not elsewhere classified: Secondary | ICD-10-CM | POA: Diagnosis present

## 2017-06-22 NOTE — Therapy (Signed)
[place], [place], [address] Phone: [phone]   Fax:  [phone]  Physical Therapy Evaluation  Patient Details  Name: [name] MRN: [id number] Date of Birth: December 29, 1960 Referring Provider: Princella Pellegrini   Encounter Date: 06/22/2017  PT End of Session - 06/22/17 1352    Visit Number  1    Number of Visits  13    Date for PT Re-Evaluation  07/20/17    PT Start Time  9326    PT Stop Time  1348    PT Time Calculation (min)  45 min    Activity Tolerance  Patient tolerated treatment well    Behavior During Therapy  [facility] for tasks assessed/performed       Past Medical History:  Diagnosis Date  . Anemia   . Arthritis    back & knees   . Asthma    uses albuterol inhaler if outside, cold weather & exercising   . GERD (gastroesophageal reflux disease)   . History of hiatal hernia   . History of kidney stones    passed spontaneously  . Melanoma ([place])    malignant, left leg,  wide excision, sentinel node, one positive stage 3, as a result- lymphdema, will be following with ONCOLOGY, PET scan- last 2017  . Myasthenia gravis ([place])   . Pneumonia 2015   hosp. [facility] , treated with IV antibiotics   . Sleep apnea    sleep irregular pattern- no CPAP order solidified yet.     Past Surgical History:  Procedure Laterality Date  . APPENDECTOMY    . BREAST SURGERY Bilateral 2004   reduction   . EYE SURGERY Bilateral 1999   corneal peel  . MELANOMA EXCISION Left    removed from left leg  . OOPHORECTOMY Left   . THYMECTOMY  2001  . TOTAL KNEE ARTHROPLASTY Left 01/2016  . WRIST SURGERY Left    plate & 6 screws     There were no vitals filed for this visit.   Subjective Assessment - 06/22/17 1308    Subjective  I have swelling of my left leg and they took out 16 sentinel lymph nodes. I also have some swelling of my right leg but it is not bad. I had melanoma removed from it but no  lymph nodes taken. I also have swelling of the left side of my face. I am not sure why there is sweling in this area. I had a PET scan on 05/26/15. They are talking about setting up another PET scan. I have had the swelling in my face for 8 or 9 months. Compression stockings are uncomfortable because they hit the scar. I wear compression leggings every day.     Pertinent History  L TKA 01/2016, 03/01/17 rods and screw at L4, L5, L6, history of metastatic malignant melanoma stage IIIb of the left posterior thigh, one positive lymph node diagnosed in September 2015 with excision. Maximum tumor thickness was 1.84 mm, excision was 2.5 cm deep. Surgery on Sept 2015- 16 lymph nodes biopsied, She did not have chemotherapy as she felt at that time the risk outweighed the benefit, She has also had melanomas of the right calf and left upper arm removed by Mohs procedure.     Patient Stated Goals  to get the swelling under control    Currently in Pain?  Yes    Pain Score  5     Pain Location  Leg  Pain Orientation  Left    Pain Descriptors / Indicators  Aching    Pain Type  Chronic pain    Pain Radiating Towards  radiates throughout leg    Pain Onset  More than a month ago 5 years    Pain Frequency  Constant    Aggravating Factors   difficult to walk, makes L TKA feel unstable    Pain Relieving Factors  elevating it, compression pump,     Effect of Pain on Daily Activities  hard to walk, difficult to complete ADLs         [facility] PT Assessment - 06/22/17 0001      Assessment   Medical Diagnosis  malignant melanoma of LLE    Referring Provider  Sarah Cinncinati    Onset Date/Surgical Date  10/29/13    Prior Therapy  previous therapy for back surgery this year      Precautions   Precautions  Other (comment);Knee    Precaution Comments  lymphedema      Restrictions   Weight Bearing Restrictions  No      Balance Screen   Has the patient fallen in the past 6 months  Yes    How many times?  3 she  works w/ dementia pt and pt pushed her, pt tripped once,    Has the patient had a decrease in activity level because of a fear of falling?   No    Is the patient reluctant to leave their home because of a fear of falling?   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    Leisure  pt used to play tennis often but lately has not been able to after the back surgery      Cognition   Overall Cognitive Status  Within Functional Limits for tasks assessed      Observation/Other Assessments   Skin Integrity  large scar at left upper groin from lymph node removal        LYMPHEDEMA/ONCOLOGY QUESTIONNAIRE - 06/22/17 1331      Type   Cancer Type  left LE melanoma      Surgeries   Other Surgery Date  10/29/13 pt developed an infection and had to use wound vac    Number Lymph Nodes Removed  16      Treatment   Active Chemotherapy Treatment  No    Past Chemotherapy Treatment  No    Active Radiation Treatment  No    Past Radiation Treatment  No      What other symptoms do you have   Are you Having Heaviness or Tightness  Yes    Are you having Pain  Yes    Is it Hard or Difficult finding clothes that fit  Yes    Do you have infections  Yes    Comments  after surgery got infection and had to have wound vac    Is there Decreased scar mobility  Yes      Lymphedema Assessments   Lymphedema Assessments  Lower extremities      Right Lower Extremity Lymphedema   30  cm Proximal to Suprapatella  77 cm    20 cm Proximal to Suprapatella  71 cm    10 cm Proximal to Suprapatella  59 cm    At Midpatella/Popliteal Crease  49.5 cm    30 cm Proximal to Floor at Lateral Plantar Foot  40.5 cm    20 cm Proximal to Floor  at Lateral Plantar Foot  31.4 1    10  cm Proximal to Floor at Lateral Malleoli  24.5 cm    5 cm Proximal to 1st MTP Joint  22.2 cm    Across MTP Joint  23 cm    Around Proximal Great Toe  8.4 cm      Left Lower Extremity Lymphedema   30 cm Proximal to Suprapatella  80 cm    20 cm Proximal to Suprapatella  75.5 cm    10 cm Proximal to Suprapatella  68.8 cm    At Midpatella/Popliteal Crease  48 cm    30 cm Proximal to Floor at Lateral Plantar Foot  44.5 cm    20 cm Proximal to Floor at Lateral Plantar Foot  37.6 cm    10 cm Proximal to Floor at Lateral Malleoli  27.9 cm    5 cm Proximal to 1st MTP Joint  22.4 cm    Across MTP Joint  21.9 cm    Around Proximal Great Toe  8 cm             Outpatient Rehab from 06/22/2017 [facility]  Lymphedema Life Impact Scale Total Score  48.53 %      Objective measurements completed on examination: See above findings.                   PT Long Term Goals - 06/22/17 1619      PT LONG TERM GOAL #1   Title  Pt will receive appropriate garments that she can use to manage her lymphedema independently and ones that will not aggrevate her scar.    Time  4    Period  Weeks    Status  New    Target Date  07/20/17      PT LONG TERM GOAL #2   Title  Pt will be able to independently apply compression bandages to LLE for long term management of lymphedema    Time  4    Period  Weeks    Status  New    Target Date  07/20/16      PT LONG TERM GOAL #3   Title  Pt will demonstrate a 5 cm reduction in circumference of LLE 10 cm proximal to top of patella to decrease risk of infection    Baseline  68.8    Time  4    Period  Weeks    Status  New    Target Date  07/20/17             Plan - 06/22/17 1352    Clinical Impression Statement  Pt presents today with LLE lymphedema present from foot to knee following surgery to remove malignant melanoma from posterior thigh and and a lymph node  dissection where 16 nodes were removed from left groin. Pt also has a previous history of melanoma of RLE and LUE neither of which had lymph node biopsies. Pt does not have a hx of radiation of chemo. She also reports left sided face swelling that began 8 or 9 months ago which is very visible.  Explained to pt that this is not a result of the lymph node biopsy in her left groin. She has had several falls recently and feels her L LE gives out since she underwent treatment for melanoma. She has has a left TKA. Pt currently has a compression pump and was educated to begin using this daily. She has velcro compression garments and a compression stocking. She reports the velcro garment slides down and the stocking rubs along her scar and is uncomfortable. Pt would benefit from skilled PT services to help decrease LLE lymphedema and assist pt with finding a compression garment that she can wear comfortably that will not aggrevate her scar in her left groin.     History and Personal Factors relevant to plan of care:  L TKA, recent back surgery, hx of multiple melanomas    Clinical Presentation  Evolving    Clinical Presentation due to:  face swelling of unknown origin    Clinical Decision Making  Moderate    Rehab Potential  Good    Clinical Impairments Affecting Rehab Potential  large scar impeding lymph flow    PT Frequency  3x / week    PT Duration  4 weeks    PT Treatment/Interventions  ADLs/Self Care Home Management;Taping;Vasopneumatic Device;Compression bandaging;Scar mobilization;Passive range of motion;Manual lymph drainage;Manual techniques;Orthotic Fit/Training;Therapeutic exercise;Therapeutic activities    PT Next Visit Plan  look at possible garment options that will not rub scar, assess her current velcro garments and stocking for fit, teach bandaging to LLE, encourage daily pump use, give lymphedema risk reduction practice handout    Consulted and Agree with Plan of Care  Patient       Patient  will benefit from skilled therapeutic intervention in order to improve the following deficits and impairments:  Increased edema, Decreased knowledge of precautions, Decreased strength, Decreased scar mobility, Pain  Visit Diagnosis: Lymphedema, not elsewhere classified - Plan: PT plan of care cert/re-cert  Pain in left leg - Plan: PT plan of care cert/re-cert  Difficulty in walking, not elsewhere classified - Plan: PT plan of care cert/re-cert     Problem List Patient Active Problem List   Diagnosis Date Noted  . IDA (iron deficiency anemia) 06/21/2017  . Spondylolisthesis at L3-L4 level 03/01/2017  . Allergy to environmental factors 09/15/2016  . Chronic low back pain 09/15/2016  . Degenerative spondylolisthesis 09/15/2016  . Depression 09/15/2016  . Leg edema 09/15/2016  . Lumbosacral spondylosis without myelopathy 09/15/2016  . Osteoarthritis of knees, bilateral 09/15/2016  . Pernicious anemia 09/15/2016  . 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 Malignant melanoma of thigh, left ([place]) Stage III 11/05/2013  . CERVICAL RADICULOPATHY, LEFT 06/04/2008  . MEDIAL EPICONDYLITIS 06/04/2008  . ANEMIA-NOS 03/25/2008  . ALLERGIC RHINITIS 03/25/2008  . Asthma 03/25/2008  . FATIGUE 03/25/2008  . B12 DEFICIENCY 06/04/2007  . RESTLESS LEG SYNDROME 06/04/2007  . MYASTHENIA GRAVIS WITHOUT EXACERBATION 06/04/2007  . GERD 06/04/2007  . IRRITABLE BOWEL SYNDROME 06/04/2007    Allyson Sabal [facility] 06/22/2017, 4:27 PM  [place], [place], [address] Phone: [phone]   Fax:  [phone]  Name: [name] MRN: [id number] Date of Birth: May 14, 1960  Manus Gunning, PT 06/22/17 4:29 PM

## 2017-06-26 ENCOUNTER — Other Ambulatory Visit: Payer: Self-pay | Admitting: Family

## 2017-06-26 ENCOUNTER — Telehealth: Payer: Self-pay | Admitting: Family

## 2017-06-26 DIAGNOSIS — D508 Other iron deficiency anemias: Secondary | ICD-10-CM

## 2017-06-26 NOTE — Telephone Encounter (Signed)
I spoke with [name] about her low iron counts. She has tried oral iron in the past and was unable to tolerate due to GI upset. She is now speaking with [name] our financial counselor and understands their may be a co-pay involved. We will get her scheduled for 2 doses of IV iron and then repeat labs in 6 weeks.

## 2017-06-30 ENCOUNTER — Other Ambulatory Visit: Payer: Self-pay | Admitting: Family

## 2017-06-30 ENCOUNTER — Inpatient Hospital Stay: Payer: BLUE CROSS/BLUE SHIELD | Attending: Hematology & Oncology

## 2017-06-30 ENCOUNTER — Telehealth: Payer: Self-pay | Admitting: Family Medicine

## 2017-06-30 VITALS — BP 139/57 | HR 84 | Temp 97.7°F | Resp 18

## 2017-06-30 DIAGNOSIS — D649 Anemia, unspecified: Secondary | ICD-10-CM | POA: Insufficient documentation

## 2017-06-30 DIAGNOSIS — G2581 Restless legs syndrome: Secondary | ICD-10-CM

## 2017-06-30 DIAGNOSIS — D508 Other iron deficiency anemias: Secondary | ICD-10-CM

## 2017-06-30 MED ORDER — SODIUM CHLORIDE 0.9 % IV SOLN
Freq: Once | INTRAVENOUS | Status: AC
  Administered 2017-06-30: 08:00:00 via INTRAVENOUS

## 2017-06-30 MED ORDER — FERUMOXYTOL INJECTION 510 MG/17 ML
510.0000 mg | Freq: Once | INTRAVENOUS | Status: AC
  Administered 2017-06-30: 510 mg via INTRAVENOUS
  Filled 2017-06-30: qty 17

## 2017-06-30 MED ORDER — PRAMIPEXOLE DIHYDROCHLORIDE 1.5 MG PO TABS: 3.0000 mg | ORAL_TABLET | Freq: Two times a day (BID) | ORAL | 2 refills | Status: DC | PRN

## 2017-06-30 NOTE — Telephone Encounter (Signed)
Patient was referred by Clarise Cruz from the [facility] upstairs. However patient would like to estb care ASAP. Will you approve of appointment on 07-05-2017- @ 12 noon ?

## 2017-06-30 NOTE — Telephone Encounter (Signed)
ok 

## 2017-06-30 NOTE — Patient Instructions (Signed)

## 2017-07-04 ENCOUNTER — Ambulatory Visit (HOSPITAL_COMMUNITY)
Admission: RE | Admit: 2017-07-04 | Discharge: 2017-07-04 | Disposition: A | Discharge status: Home / Self Care | Payer: BLUE CROSS/BLUE SHIELD | Source: Ambulatory Visit | Attending: Family | Admitting: Family

## 2017-07-04 DIAGNOSIS — C4372 Malignant melanoma of left lower limb, including hip: Secondary | ICD-10-CM

## 2017-07-04 LAB — GLUCOSE, CAPILLARY: Glucose-Capillary: 113 mg/dL — ABNORMAL HIGH (ref 65–99)

## 2017-07-04 MED ORDER — FLUDEOXYGLUCOSE F - 18 (FDG) INJECTION
11.8800 | Freq: Once | INTRAVENOUS | Status: AC | PRN
  Administered 2017-07-04: 11.88 via INTRAVENOUS

## 2017-07-05 ENCOUNTER — Telehealth: Payer: Self-pay | Admitting: Family

## 2017-07-05 ENCOUNTER — Encounter: Payer: Self-pay | Admitting: Family

## 2017-07-05 NOTE — Telephone Encounter (Signed)
I spoke with [name] and went over her PET scan results in detail. She still jhas significant lymphedema in her lower extremities. She states that insurance would not cover her PT referral since she had back surgery and PT earlier this year. We will write a letter of appeal today for this.  All questions were answered and we will wait to her back about the referral.

## 2017-07-07 ENCOUNTER — Inpatient Hospital Stay: Payer: BLUE CROSS/BLUE SHIELD

## 2017-07-07 ENCOUNTER — Other Ambulatory Visit: Payer: Self-pay

## 2017-07-07 VITALS — BP 136/84 | HR 82 | Temp 98.2°F | Resp 18

## 2017-07-07 DIAGNOSIS — D508 Other iron deficiency anemias: Secondary | ICD-10-CM

## 2017-07-07 DIAGNOSIS — D649 Anemia, unspecified: Secondary | ICD-10-CM | POA: Diagnosis not present

## 2017-07-07 MED ORDER — SODIUM CHLORIDE 0.9 % IV SOLN
Freq: Once | INTRAVENOUS | Status: AC
  Administered 2017-07-07: 08:00:00 via INTRAVENOUS

## 2017-07-07 MED ORDER — FERUMOXYTOL INJECTION 510 MG/17 ML
510.0000 mg | Freq: Once | INTRAVENOUS | Status: AC
  Administered 2017-07-07: 510 mg via INTRAVENOUS
  Filled 2017-07-07: qty 17

## 2017-07-07 NOTE — Patient Instructions (Signed)

## 2017-07-11 NOTE — Telephone Encounter (Signed)
Patient was sick that day. Patient made new appointment.

## 2017-07-12 ENCOUNTER — Encounter: Payer: Self-pay | Admitting: Family Medicine

## 2017-07-12 ENCOUNTER — Ambulatory Visit (INDEPENDENT_AMBULATORY_CARE_PROVIDER_SITE_OTHER): Payer: BLUE CROSS/BLUE SHIELD | Admitting: Family Medicine

## 2017-07-12 VITALS — BP 146/80 | HR 87 | Resp 16 | Ht 66.0 in | Wt 260.0 lb

## 2017-07-12 DIAGNOSIS — G7000 Myasthenia gravis without (acute) exacerbation: Secondary | ICD-10-CM | POA: Diagnosis not present

## 2017-07-12 DIAGNOSIS — M545 Low back pain: Secondary | ICD-10-CM

## 2017-07-12 DIAGNOSIS — R52 Pain, unspecified: Secondary | ICD-10-CM | POA: Diagnosis not present

## 2017-07-12 DIAGNOSIS — R600 Localized edema: Secondary | ICD-10-CM | POA: Diagnosis not present

## 2017-07-12 DIAGNOSIS — R0982 Postnasal drip: Secondary | ICD-10-CM

## 2017-07-12 DIAGNOSIS — C4372 Malignant melanoma of left lower limb, including hip: Secondary | ICD-10-CM | POA: Diagnosis not present

## 2017-07-12 DIAGNOSIS — G8929 Other chronic pain: Secondary | ICD-10-CM

## 2017-07-12 MED ORDER — IPRATROPIUM BROMIDE 0.03 % NA SOLN: 2.0000 | Freq: Four times a day (QID) | NASAL | 6 refills | Status: DC

## 2017-07-12 MED ORDER — HYDROCODONE-ACETAMINOPHEN 5-325 MG PO TABS: 1.0000 | ORAL_TABLET | Freq: Four times a day (QID) | ORAL | 0 refills | Status: DC | PRN

## 2017-07-12 MED ORDER — HYDROCHLOROTHIAZIDE 12.5 MG PO CAPS: 12.5000 mg | ORAL_CAPSULE | Freq: Every day | ORAL | 6 refills | Status: DC

## 2017-07-12 NOTE — Progress Notes (Signed)
[facility] [address] [place], [place] [address] [phone]  Date:  07/12/2017   Name:  [name]   DOB:  December 11, 1960   MRN:  [id number]  PCP:  Darreld Mclean, MD    Chief Complaint: New Patient (Initial Visit) (lymphadema-not controlled, painful); Iron Def.  (previous infusions, shortness of breath, cough, worse when laying down, trouble sleeping); and RLS (not controlled, tried requip-not working)   History of Present Illness:  [name] is a 57 y.o. very pleasant female patient who presents with the following:  Here today as a new patient- she is originally from [place] but moved back to this area from [place] about 6 months ago due to family being here and job change Her children live here- her son is Agricultural consultant and daughter is in sports med.  Her current (second) husband has 4 grands that they share Prior to her dx of melanoma in 2015 she was generally in good health.  She had surgery on her left leg for melanoma in 2015 with lymph node dissection which resulted in lymphedema Besides her left leg lymphedema she also has some milder swelling in her right leg   She had spine surgery per Dr. Saintclair Halsted in January. This has helped to relieve numbness and burning in her feet and legs.  She visited with Dr. Saintclair Halsted yesterday. Her standing tolerance has improved from 10 min to 30- 60 minutes now  She is also a pt at heme/once here at the med center, they are helping monitor her anemia/ iron def as well as her melanoma history  From most recent heme/onc note from April:  HISTORY OF PRESENT ILLNESS: [name] is a very pleasant 57 yo caucasian female with history of metastatic malignant melanoma stage IIIb of the left posterior thigh, one positive lymph node diagnosed in September 2015 with excision. Maximum tumor thickness was 1.84 mm, excision was 2.5 cm deep. She did not have chemotherapy as she felt at that time the  risk outweighed the benefit.  She has also had melanomas of the right calf and left upper arm removed by Mohs procedure.  She states that her last PET scan was in 2017 and that she has had genetic testing done. She will bring [name] both of these reports as soon as she can.  She has chronic lymphedema of both lower extremities and has noticeable fluid in the left side of her face. No JVD noted. No lymphadenopathy found on exam.  She states that she needs new compression stockings.  She sees a PT specializing in lymphedema massage. She would like a referral. She also uses a lymphedema machine at home at night.  No other personal cancer history. Family cancer history is quite extensive. Her mother had 11 siblings all of whom passed away from various cancers including lung, breast and colon. Maternal grandmother had lymphoma. Father and paternal uncle had lung, paternal uncle and aunt had pancreatic cancer, paternal grandmother had lymphoma and paternal grandfather had an unknown primary.   She states that her mammogram was in 2018 and negative.   She had her endoscopy and colonoscopy in November 2018 which were negative.  In January, she had posterior lumbar fusion of L3-L5 with rads ands crews placed. She states that she has intermittent  numbness and tingling in her feet due to her back issues.  She has had no issue with infections. No fever, chills, n/v, cough, rash, dizziness, chest  pain, abdominal pain or changes in bowel or bladder habits.  She has occasional SOB due to asthma. She also has occasional palpitations due to anxiety and stress with the move and trying to find a new job.  She is originally from this area but recently moved back from [place] when she lost her job.   She has a good appetite and is staying well hydrated. Her weight is stable.  She is not a smoker and does not drink.   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  Prior to 2015 she was generally in good  health except for her dx of myasthenia gravis.  However she was told that this was cured - that she did not show any signs on her labs, etc, prior to moving back from [place].  She is not sure what to make of this as she did not think that MG could go away  She is set up to see Dr. 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  She is quite experienced in this field  She is seeing [place] at [facility] for her GYN care  PET scan earlier this month IMPRESSION: 1. No evidence of metastatic melanoma. 2. Mild metabolic activity at  the surgical sited in the LEFT thigh. 3. Swelling of the LEFT thigh compared to the RIGHT. 4. Linear uptake in the RIGHT foot is favored physiologic muscle Activity  She enjoys tennis when she is well enough. She has gained a lot of weight during her recnet health challenges She is bothered by this weight gain- she has gained about 120 lbs over the last 10 years or so  She has been trying to watch her diet and walk as much as she can. However her exercise tolerance is limited by her pain. She has tried using pain medication before and after her exercise regimen  She wonders if I can manage her pain meds.  This is fine, will have her do a contract and UDS today  She has suffered from a cough for about 3 months now. She has had to use a few rounds of prednisone, abx.   She is taking protonix as well She has a hiatal hernia as well    BP Readings from Last 3 Encounters:  07/12/17 (!) 146/80  07/07/17 136/84  06/30/17 (!) 139/57   She uses vicodin 5- she might use 60 in a typical month ambien CR- not taking right now  [place] 05/22/2017  4  05/22/2017  Hydrocodone-Chlorphen Er Susp  50 5 Lynford Citizen  [id number]  [place] ([id number])  0 20.00 MME Comm  Ins  [place]  05/17/2017  4  05/17/2017  Hydrocodone-Acetamin 5-325 Mg  30 5 Pa Wri  [id number]  [place] ([id number])  0 30.00 MME Comm Ins  [place]  04/28/2017  4  04/28/2017  Hydrocodone-Acetamin 5-325 Mg  30 7 Ki Mey  [id number]  [place] ([id number])  0 21.43 MME Comm Ins  [place]  04/07/2017  3  11/21/2016  Zolpidem Tart Er 12.5 Mg Tab  30 30 Jiles Garter  [id number]  [place] [id number])  1       Patient Active Problem List   Diagnosis Date Noted  .  IDA (iron deficiency anemia) 06/21/2017  . Spondylolisthesis at L3-L4 level 03/01/2017  . Allergy to environmental factors 09/15/2016  . Chronic low back pain 09/15/2016  . Degenerative spondylolisthesis 09/15/2016  . Depression 09/15/2016  . Leg edema 09/15/2016  . Lumbosacral spondylosis without myelopathy 09/15/2016  . Osteoarthritis of knees, bilateral 09/15/2016  . Pernicious anemia 09/15/2016  . 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 Malignant melanoma of thigh, left ([place]) Stage III 11/05/2013  . CERVICAL RADICULOPATHY, LEFT 06/04/2008  . MEDIAL EPICONDYLITIS 06/04/2008  . ANEMIA-NOS 03/25/2008  . ALLERGIC RHINITIS 03/25/2008  . Asthma 03/25/2008  . FATIGUE 03/25/2008  . B12 DEFICIENCY 06/04/2007  . RESTLESS LEG SYNDROME 06/04/2007  . MYASTHENIA GRAVIS WITHOUT EXACERBATION 06/04/2007  . GERD 06/04/2007  . IRRITABLE BOWEL SYNDROME 06/04/2007    Past Medical History:  Diagnosis Date  . Anemia   . Arthritis    back & knees   . Asthma    uses albuterol inhaler if outside, cold weather & exercising   . GERD (gastroesophageal reflux disease)   . History of hiatal hernia   . History of kidney stones    passed spontaneously  . Melanoma ([place])    malignant, left leg,  wide excision, sentinel node, one positive stage 3, as a result- lymphdema, will be following with ONCOLOGY, PET scan- last 2017  . Myasthenia gravis ([place])   . Pneumonia 2015   hosp. [facility] , treated with IV antibiotics   . Sleep apnea    sleep irregular pattern- no CPAP order  solidified yet.     Past Surgical History:  Procedure Laterality Date  . APPENDECTOMY    . BACK SURGERY    . BREAST SURGERY Bilateral 2004   reduction   . EYE SURGERY Bilateral 1999   corneal peel  . MELANOMA EXCISION Left    removed from left leg  . OOPHORECTOMY Left   . THYMECTOMY  2001  . TOTAL KNEE ARTHROPLASTY Left 01/2016  . WRIST SURGERY Left    plate & 6 screws     Social History   Tobacco Use  . Smoking status: Never Smoker  . Smokeless tobacco: Never Used  Substance Use Topics  . Alcohol use: Yes    Alcohol/week: 0.5 oz    Types: 1 Standard drinks or equivalent per week    Comment: occassionally  . Drug use: No    Family History  Problem Relation Age of Onset  . COPD Mother   . Lung cancer Father   . Diverticulitis Father   . Colitis Neg Hx     Allergies  Allergen Reactions  . Other Other (See Comments)    Cant take some "mycin" drugs   . Tape Other (See Comments)    Blister, irritates skin    Medication list has been reviewed and updated.  Current Outpatient Medications on File Prior to Visit  Medication Sig Dispense Refill  . albuterol (PROVENTIL HFA;VENTOLIN HFA) 108 (90 Base) MCG/ACT inhaler Inhale 1 puff into the lungs every 6 (six) hours as needed for wheezing or shortness of breath. 1 Inhaler 6  . pantoprazole (PROTONIX) 40 MG tablet Take twice daily for 3 months, then daily 60 tablet 11  . pramipexole (MIRAPEX) 1.5 MG tablet Take 2 tablets (3 mg total) by mouth 2 (two) times daily as needed (for restless leg syndrome). 120 tablet 2  . tiZANidine (ZANAFLEX) 4 MG tablet Take 1 tablet (4 mg total) by mouth every 6 (six) hours as needed  for muscle spasms (if unresponsive to cyclobenzaprine). (Patient taking differently: Take 4-6 mg by mouth every 6 (six) hours as needed for muscle spasms (if unresponsive to cyclobenzaprine). ) 30 tablet 0   No current facility-administered medications on file prior to visit.     Review of Systems:  As per  HPI- otherwise negative.   Physical Examination: Vitals:   07/12/17 0822  BP: (!) 146/80  Pulse: 87  Resp: 16  SpO2: 100%   Vitals:   07/12/17 0822  Weight: 260 lb (117.9 kg)  Height: 5\' 6"  (1.676 m)   Body mass index is 41.97 kg/m. Ideal Body Weight: Weight in (lb) to have BMI = 25: 154.6  GEN: WDWN, NAD, Non-toxic, A & O x 3, looks well, obeses HEENT: Atraumatic, Normocephalic. Neck supple. No masses, No LAD.  Bilateral TM wnl, oropharynx normal.  PEERL,EOMI.  Ears and Nose: No external deformity. CV: RRR, No M/G/R. No JVD. No thrill. No extra heart sounds. PULM: CTA B, no wheezes, crackles, rhonchi. No retractions. No resp. distress. No accessory muscle use. EXTR: No c/c/e NEURO Normal gait.  PSYCH: Normally interactive. Conversant. Not depressed or anxious appearing.  Calm demeanor.  Mild edema of her right leg Lymphedema in the left leg is obvious  She is wearing compression style leggings    Assessment and Plan: Leg edema - Plan: hydrochlorothiazide (MICROZIDE) 12.5 MG capsule  Pain management - Plan: Pain Mgmt, Profile 8 w/Conf, U, HYDROcodone-acetaminophen (NORCO/VICODIN) 5-325 MG tablet  Post-nasal drip - Plan: ipratropium (ATROVENT) 0.03 % nasal spray  Malignant melanoma of thigh, left ([facility]) Stage III  MYASTHENIA GRAVIS WITHOUT EXACERBATION  Chronic low back pain, unspecified back pain laterality, with sciatica presence unspecified  Morbid obesity ([place])  Establishing care today Will have her try HCTZ for the swelling in her right leg- she knows that this likely won't help with lymphedema but may be helpful for venous insuf Will have her try atrovent nasal for her cough at night A lot of her issues- GERD, knee pain, back pain, lymphedema- are exacerbated by her weight.  She is interested in looking into weight loss surgery.  Discussed how to start this process.  She will let me know when she needs a referral    Signed Lamar Blinks, MD

## 2017-07-12 NOTE — Patient Instructions (Addendum)
Please check out the Bariatric surgery intake seminar either in person on online  [email or web address]  I gave you some atrovent nasal spray to try- use up to 4x a day-for your cough.  If not helpful you can stop using it I refilled your pain medication today. Let me know when you need more Please stop by the lab for a urine drug screen today  rx for HCTZ as well to use for swelling- use one daily as needed

## 2017-07-13 LAB — PAIN MGMT, PROFILE 8 W/CONF, U
6 Acetylmorphine: NEGATIVE ng/mL (ref <10)
Alcohol Metabolites: NEGATIVE ng/mL (ref <500)
Amphetamines: NEGATIVE ng/mL (ref <500)
BENZODIAZEPINES: NEGATIVE ng/mL (ref <100)
Buprenorphine, Urine: NEGATIVE ng/mL (ref <5)
COCAINE METABOLITE: NEGATIVE ng/mL (ref <150)
CREATININE: 159.2 mg/dL
MDMA: NEGATIVE ng/mL (ref <500)
Marijuana Metabolite: NEGATIVE ng/mL (ref <20)
Opiates: NEGATIVE ng/mL (ref <100)
Oxidant: NEGATIVE ug/mL (ref <200)
Oxycodone: NEGATIVE ng/mL (ref <100)
PH: 6.03 (ref 4.5–9.0)

## 2017-07-16 ENCOUNTER — Encounter: Payer: Self-pay | Admitting: Family Medicine

## 2017-07-17 ENCOUNTER — Encounter: Payer: Self-pay | Admitting: Physical Therapy

## 2017-07-17 ENCOUNTER — Ambulatory Visit: Payer: BLUE CROSS/BLUE SHIELD | Attending: Family | Admitting: Physical Therapy

## 2017-07-17 DIAGNOSIS — R262 Difficulty in walking, not elsewhere classified: Secondary | ICD-10-CM | POA: Insufficient documentation

## 2017-07-17 DIAGNOSIS — M79605 Pain in left leg: Secondary | ICD-10-CM | POA: Diagnosis present

## 2017-07-17 DIAGNOSIS — I890 Lymphedema, not elsewhere classified: Secondary | ICD-10-CM

## 2017-07-17 NOTE — Patient Instructions (Signed)
Smallest bandage: around 4 times behind toes then make figure 8 Next size bandage: Heel, Ankle, Sole with entire bandage Next largest bandage: do spiral from ankle to knee overlapping by 50%  With largest bandage: Start just above ankle and wrap up towards knee overlapping by 50% With largest bandage: Start just below knee and spiral up towards groin overlapping by 50% With largest bandage: Begin where last bandage ended and spiral up towards groin overlapping by 50% With largest bandage: Begin at mid thigh and spiral up towards groin overlapping by 50%v

## 2017-07-17 NOTE — Therapy (Signed)
[place], [place], [address] Phone: [phone]   Fax:  [phone]  Physical Therapy Treatment  Patient Details  Name: [name] MRN: [id number] Date of Birth: 1960-08-18 Referring Provider: Princella Pellegrini   Encounter Date: 07/17/2017  PT End of Session - 07/17/17 1233    Visit Number  2    Number of Visits  13    Date for PT Re-Evaluation  07/20/17    PT Start Time  1109    PT Stop Time  1204    PT Time Calculation (min)  55 min    Activity Tolerance  Patient tolerated treatment well    Behavior During Therapy  [facility] for tasks assessed/performed       Past Medical History:  Diagnosis Date  . Anemia   . Arthritis    back & knees   . Asthma    uses albuterol inhaler if outside, cold weather & exercising   . GERD (gastroesophageal reflux disease)   . History of hiatal hernia   . History of kidney stones    passed spontaneously  . Melanoma ([place])    malignant, left leg,  wide excision, sentinel node, one positive stage 3, as a result- lymphdema, will be following with ONCOLOGY, PET scan- last 2017  . Myasthenia gravis ([place])   . Pneumonia 2015   hosp. [facility] , treated with IV antibiotics   . Sleep apnea    sleep irregular pattern- no CPAP order solidified yet.     Past Surgical History:  Procedure Laterality Date  . APPENDECTOMY    . BACK SURGERY    . BREAST SURGERY Bilateral 2004   reduction   . EYE SURGERY Bilateral 1999   corneal peel  . MELANOMA EXCISION Left    removed from left leg  . OOPHORECTOMY Left   . THYMECTOMY  2001  . TOTAL KNEE ARTHROPLASTY Left 01/2016  . WRIST SURGERY Left    plate & 6 screws     There were no vitals filed for this visit.  Subjective Assessment - 07/17/17 1112    Subjective  I started back to work last week and my leg has been huge. The whole left leg has been swelling and the bottom of the right leg has been swelling. I had a PET  scan and there was nothing. I am still having swelling on the left side of my face. I had to have two infusions of iron.     Pertinent History  L TKA 01/2016, 03/01/17 rods and screw at L4, L5, L6, history of metastatic malignant melanoma stage IIIb of the left posterior thigh, one positive lymph node diagnosed in September 2015 with excision. Maximum tumor thickness was 1.84 mm, excision was 2.5 cm deep. Surgery on Sept 2015- 16 lymph nodes biopsied, She did not have chemotherapy as she felt at that time the risk outweighed the benefit, She has also had melanomas of the right calf and left upper arm removed by Mohs procedure.     Patient Stated Goals  to get the swelling under control    Currently in Pain?  Yes    Pain Score  7     Pain Location  Leg    Pain Orientation  Left    Pain Descriptors / Indicators  Aching                  Outpatient Rehab from 06/22/2017 in [place]  Lymphedema Life  Impact Scale Total Score  48.53 %           [facility] Adult PT Treatment/Exercise - 07/17/17 0001      Manual Therapy   Manual Therapy  Compression Bandaging;Edema management    Edema Management  educated pt about different types of compression garments and educated pt and issued information for pt to obtain a flat knit thigh high custom compression garment    Compression Bandaging  instructed pt in self bandaging technique to LLE as follows: thick stockinette, artiflex from foot to groin, 1 6cm at foot, 1 8 cm at ankle, 1 10 cm from ankle to knee, 1 12 cm from ankle to knee, 3 12 cm from just below knee to groin- pt able to demonstrate independence with this                  PT Long Term Goals - 06/22/17 1619      PT LONG TERM GOAL #1   Title  Pt will receive appropriate garments that she can use to manage her lymphedema independently and ones that will not aggrevate her scar.    Time  4    Period  Weeks    Status  New    Target Date   07/20/17      PT LONG TERM GOAL #2   Title  Pt will be able to independently apply compression bandages to LLE for long term management of lymphedema    Time  4    Period  Weeks    Status  New    Target Date  07/20/16      PT LONG TERM GOAL #3   Title  Pt will demonstrate a 5 cm reduction in circumference of LLE 10 cm proximal to top of patella to decrease risk of infection    Baseline  68.8    Time  4    Period  Weeks    Status  New    Target Date  07/20/17            Plan - 07/17/17 1233    Clinical Impression Statement  Educated pt about compression garments today and assessed pt's current compression garments. Her current compression garments are too long and pt has to roll them down at the top causing too much compression in this area. Pt would benefit from custom thigh high flat knit compression stocking for LLE. Instructed pt today in bandaging technique for LLE. Educated pt to bandage at night after using her lymphedema pump.     Rehab Potential  Good    Clinical Impairments Affecting Rehab Potential  large scar impeding lymph flow    PT Frequency  3x / week    PT Duration  4 weeks    PT Treatment/Interventions  ADLs/Self Care Home Management;Taping;Vasopneumatic Device;Compression bandaging;Scar mobilization;Passive range of motion;Manual lymph drainage;Manual techniques;Orthotic Fit/Training;Therapeutic exercise;Therapeutic activities    PT Next Visit Plan  Assess indep with self bandaging to LLE, see if pt was measured for compression garmnets, give lymphedema risk reduction     Consulted and Agree with Plan of Care  Patient       Patient will benefit from skilled therapeutic intervention in order to improve the following deficits and impairments:  Increased edema, Decreased knowledge of precautions, Decreased strength, Decreased scar mobility, Pain  Visit Diagnosis: Lymphedema, not elsewhere classified  Pain in left leg  Difficulty in walking, not elsewhere  classified     Problem List Patient Active Problem List  Diagnosis Date Noted  . IDA (iron deficiency anemia) 06/21/2017  . Spondylolisthesis at L3-L4 level 03/01/2017  . Allergy to environmental factors 09/15/2016  . Chronic low back pain 09/15/2016  . Degenerative spondylolisthesis 09/15/2016  . Depression 09/15/2016  . Leg edema 09/15/2016  . Lumbosacral spondylosis without myelopathy 09/15/2016  . Osteoarthritis of knees, bilateral 09/15/2016  . Pernicious anemia 09/15/2016  . 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 Malignant melanoma of thigh, left ([place]) Stage III 11/05/2013  . CERVICAL RADICULOPATHY, LEFT 06/04/2008  . MEDIAL EPICONDYLITIS 06/04/2008  . ANEMIA-NOS 03/25/2008  . ALLERGIC RHINITIS 03/25/2008  . Asthma 03/25/2008  . FATIGUE 03/25/2008  . B12 DEFICIENCY 06/04/2007  . RESTLESS LEG SYNDROME 06/04/2007  . MYASTHENIA GRAVIS WITHOUT EXACERBATION 06/04/2007  . GERD 06/04/2007  . IRRITABLE BOWEL SYNDROME 06/04/2007    Allyson Sabal [facility] 07/17/2017, 12:36 PM  [place], [place], [address] Phone: [phone]   Fax:  [phone]  Name: Adrien Dietzman MRN: [id number] Date of Birth: 1960-04-10  Manus Gunning, PT 07/17/17 12:36 PM

## 2017-07-19 ENCOUNTER — Telehealth: Payer: Self-pay | Admitting: Family

## 2017-07-19 NOTE — Telephone Encounter (Signed)
Called patient to find out what information needed to be included in her insurance letter. No answer and voice mail box is full. Will try again later.

## 2017-07-26 ENCOUNTER — Encounter: Payer: Self-pay | Admitting: Neurology

## 2017-07-26 ENCOUNTER — Ambulatory Visit (INDEPENDENT_AMBULATORY_CARE_PROVIDER_SITE_OTHER): Payer: BLUE CROSS/BLUE SHIELD | Admitting: Neurology

## 2017-07-26 VITALS — BP 134/87 | HR 91 | Ht 67.0 in | Wt 256.0 lb

## 2017-07-26 DIAGNOSIS — Z9089 Acquired absence of other organs: Secondary | ICD-10-CM

## 2017-07-26 DIAGNOSIS — G709 Myoneural disorder, unspecified: Secondary | ICD-10-CM

## 2017-07-26 DIAGNOSIS — G4734 Idiopathic sleep related nonobstructive alveolar hypoventilation: Secondary | ICD-10-CM | POA: Diagnosis not present

## 2017-07-26 DIAGNOSIS — D150 Benign neoplasm of thymus: Secondary | ICD-10-CM

## 2017-07-26 DIAGNOSIS — G7000 Myasthenia gravis without (acute) exacerbation: Secondary | ICD-10-CM

## 2017-07-26 DIAGNOSIS — Z6841 Body Mass Index (BMI) 40.0 and over, adult: Secondary | ICD-10-CM

## 2017-07-26 DIAGNOSIS — C4372 Malignant melanoma of left lower limb, including hip: Secondary | ICD-10-CM | POA: Diagnosis not present

## 2017-07-26 DIAGNOSIS — J99 Respiratory disorders in diseases classified elsewhere: Secondary | ICD-10-CM

## 2017-07-26 DIAGNOSIS — J988 Other specified respiratory disorders: Secondary | ICD-10-CM

## 2017-07-26 NOTE — Progress Notes (Signed)
SLEEP MEDICINE CLINIC   Provider:  Larey Seat, [place] D  Primary Care Physician:  Darreld Mclean, MD Referring Provider: Elsie Stain, MD and Dr. Marin Olp, MD   Chief Complaint  Patient presents with  . New Patient (Initial Visit)    pt alone, rm 10. pt was in the hospital and when she would sleep her oxygen levels would drop.. pt states that she had a sleep study completed at [facility] and completed in [place] in 2011. stated she had sleep apnea but she was never set up with treatment. pt was diagnosed with Mysthenia Gravis in 2000 and is needing to establish with a neurologist here as well.     HPI:  [name] is a 57 y.o. female , seen here in a referral from Dr. Joya Gaskins for transfer of Myasthenia Gravis care.  [name] was diagnosed by as single-fiber EMG in the year 2000 with myasthenia gravis.  She first noticed diplopia but also shortness of breath and a general feeling of weakness and exercise intolerance.  She was seronegative by attitude clean receptor blocking antibodies, but was worked up by a specialist in neuromuscular diseases, Dr. Tedra Coupe, at [facility] was the treating neurologist at the time.  The patient had intermittent problems with immune-supression,she is status post thymectomy, and suffered related infections, bronchitis and pneumonia which in return make the myasthenia exacerbate.    The patient has some Mestinon at home (as needed) but she is not on a daily medication.  She had been immunosuppressed with Imuran, the last one was CellCept, she has also had long-standing steroid treatment up to 80 mg daily which caused a lot of weight gain and unhappiness. She lived in [place] , [place].  Not on immune-supression now- as she was diagnosed with malignant melanoma, stage 3 A, with lymph node metastasis. Lymphedema in the left leg. The diagnosis was made in [place], [place]- Dr. Duayne Cal. Soon after she moved to [place].  She was  followed by Dr.Gioffri ,and Dr. Iran Ouch  in [place]. During her time in [place] her physicians also repeated the serum testing for myasthenia and found again no antibodies.  Her oncologist was of the opinion that she no longer has the disease, however a single-fiber EMG would very likely be positive again. She had genetic testing done for cancer- all returned negative.    When last hospitalized after weight gain on steroids, she was witnessed to be apneic and had low oxygen saturations. She needs an evaluation of Sleep apnea, hypoxia and weight management.    Sleep habits are as follows: bedtime varies- some nights she stays up ( steroids , decongestants) . She feels her brain is racing, she is often stress.  She watches Tv with her husband and often sleeps in from of the TV.  Sleep duration at night may range between 4 and 8 hours.  There are 3-4 nocturias.  Family reports she snores terribly.  The patient has thought about using a wedge or adjustable bed but at this time sleeps on a flat mattress is 2- 3 pillows for head support.  This has made breathing easier.  When she wakes up in the morning she feels very tired, not restored, not refreshed. She sleeps either on her side or on her back she reports.  Father and brother have been snoring and brother has OSA. Sister not affected, son snores.   Social history: remarried- son is a Agricultural consultant, daughter is in grad school. Non smoker, non  ETOH - 3 glasses a month or less. Caffeine - dr pepper one a day.    Review of Systems: Out of a complete 14 system review, the patient complains of only the following symptoms, and all other reviewed systems are negative. Panic attacks, anxiety, insomnia, cyclic. Shortness of breath, leg lymphoedema.  Epworth score 14 , Fatigue severity score 36  , depression score n/a   Social History   Socioeconomic History  . Marital status: Married    Spouse name: Not on file  . Number of children: 2  . Years of  education: Not on file  . Highest education level: Not on file  Occupational History  . Occupation: clinical research  Social Needs  . Financial resource strain: Not on file  . Food insecurity:    Worry: Not on file    Inability: Not on file  . Transportation needs:    Medical: Not on file    Non-medical: Not on file  Tobacco Use  . Smoking status: Never Smoker  . Smokeless tobacco: Never Used  Substance and Sexual Activity  . Alcohol use: Yes    Alcohol/week: 0.5 oz    Types: 1 Standard drinks or equivalent per week    Comment: occassionally  . Drug use: No  . Sexual activity: Yes    Birth control/protection: None  Lifestyle  . Physical activity:    Days per week: Not on file    Minutes per session: Not on file  . Stress: Not on file  Relationships  . Social connections:    Talks on phone: Not on file    Gets together: Not on file    Attends religious service: Not on file    Active member of club or organization: Not on file    Attends meetings of clubs or organizations: Not on file    Relationship status: Not on file  . Intimate partner violence:    Fear of current or ex partner: Not on file    Emotionally abused: Not on file    Physically abused: Not on file    Forced sexual activity: Not on file  Other Topics Concern  . Not on file  Social History Narrative   ** Merged History Encounter **        Family History  Problem Relation Age of Onset  . COPD Mother   . Lung cancer Father   . Diverticulitis Father   . Colitis Neg Hx     Past Medical History:  Diagnosis Date  . Anemia   . Arthritis    back & knees   . Asthma    uses albuterol inhaler if outside, cold weather & exercising   . GERD (gastroesophageal reflux disease)   . History of hiatal hernia   . History of kidney stones    passed spontaneously  . Melanoma ([place])    malignant, left leg,  wide excision, sentinel node, one positive stage 3, as a result- lymphdema, will be following with  ONCOLOGY, PET scan- last 2017  . Myasthenia gravis ([place])   . Myasthenia gravis ([place]) 2000  . Pneumonia 2015   hosp. [facility] , treated with IV antibiotics   . Sleep apnea    sleep irregular pattern- no CPAP order solidified yet.     Past Surgical History:  Procedure Laterality Date  . APPENDECTOMY    . BACK SURGERY    . BREAST SURGERY Bilateral 2004   reduction   . EYE SURGERY Bilateral 1999  corneal peel  . MELANOMA EXCISION Left    removed from left leg  . OOPHORECTOMY Left   . THYMECTOMY  2001  . TOTAL KNEE ARTHROPLASTY Left 01/2016  . WRIST SURGERY Left    plate & 6 screws     Current Outpatient Medications  Medication Sig Dispense Refill  . albuterol (PROVENTIL HFA;VENTOLIN HFA) 108 (90 Base) MCG/ACT inhaler Inhale 1 puff into the lungs every 6 (six) hours as needed for wheezing or shortness of breath. 1 Inhaler 6  . hydrochlorothiazide (MICROZIDE) 12.5 MG capsule Take 1 capsule (12.5 mg total) by mouth daily. Use as needed for swelling 30 capsule 6  . HYDROcodone-acetaminophen (NORCO/VICODIN) 5-325 MG tablet Take 1 tablet by mouth every 6 (six) hours as needed for moderate pain. 60 tablet 0  . ipratropium (ATROVENT) 0.03 % nasal spray Place 2 sprays into the nose 4 (four) times daily. 30 mL 6  . pantoprazole (PROTONIX) 40 MG tablet Take twice daily for 3 months, then daily 60 tablet 11  . pramipexole (MIRAPEX) 1.5 MG tablet Take 2 tablets (3 mg total) by mouth 2 (two) times daily as needed (for restless leg syndrome). 120 tablet 2  . tiZANidine (ZANAFLEX) 4 MG tablet Take 1 tablet (4 mg total) by mouth every 6 (six) hours as needed for muscle spasms (if unresponsive to cyclobenzaprine). (Patient taking differently: Take 4-6 mg by mouth every 6 (six) hours as needed for muscle spasms (if unresponsive to cyclobenzaprine). ) 30 tablet 0   No current facility-administered medications for this visit.     Allergies as of 07/26/2017 - Review Complete 07/26/2017  Allergen  Reaction Noted  . Other Other (See Comments) 12/12/2012  . Tape Other (See Comments) 02/15/2017    Vitals: BP 134/87   Pulse 91   Ht 5\' 7"  (1.702 m)   Wt 256 lb (116.1 kg)   BMI 40.10 kg/m  Last Weight:  Wt Readings from Last 1 Encounters:  07/26/17 256 lb (116.1 kg)   PPJ:KDTO mass index is 40.1 kg/m.     Last Height:   Ht Readings from Last 1 Encounters:  07/26/17 5\' 7"  (1.702 m)    Physical exam:  General: The patient is awake, alert and appears not in acute distress. The patient is well groomed. Head: Normocephalic, atraumatic. Neck is supple. Mallampati 3. Pale mucosa.,  neck circumference:16" . Nasal airflow congested,  Retrognathia is not seen. Double chin.  Cardiovascular:  Regular rate and rhythm , without  murmurs or carotid bruit, and without distended neck veins. Respiratory: Lungs are clear to auscultation. Skin:  Without evidence of edema, or rash Trunk: BMI is high. Neurologic exam : The patient is awake and alert, oriented to place and time. Attention span & concentration ability appears normal.  Speech is fluent, without dysarthria, dysphonia or aphasia.  Mood and affect are appropriate.  Cranial nerves: Pupils are equal and briskly reactive to light. Funduscopic exam without  evidence of pallor or edema. Extraocular movements  in vertical and horizontal planes intact and without nystagmus. Visual fields by finger perimetry are intact. Hearing to finger rub intact. Facial sensation intact to fine touch.Facial motor strength is symmetric and tongue and uvula move midline. Shoulder shrug was symmetrical.   Motor exam:  Normal tone, muscle bulk and symmetric strength in all extremities. Sensory:  Fine touch, pinprick and vibration were tested in all extremities. Proprioception tested in the upper extremities was normal.  Coordination: Rapid alternating movements in the fingers/hands was normal. Finger-to-nose maneuver  normal without evidence of ataxia,  dysmetria or tremor. Gait and station: Patient walks without assistive device and is able unassisted to climb up to the exam table. Strength within normal limits. Stance is stable and normal.  Turns with  3-4 Steps. Romberg testing is negative. Deep tendon reflexes: in the  upper and lower extremities are symmetric and intact.   Assessment:  After physical and neurologic examination, review of laboratory studies,  Personal review of imaging studies, reports of other /same  Imaging studies, results of polysomnography and / or neurophysiology testing and pre-existing records as far as provided in visit., my assessment is   1)  Very likely OSA:  Given BMI, shortness of breath, myasthenia gravis. And large neck and Mallampati contribute , too.  Will order attended sleep study.    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     3) Myasthenia gravis, seronegative. Status post thymectomy   4) melanoma, grade 3.   5) RLS _ On mirapex but improved under Iron transfusion in a patient with significant iron deficiency and anemia.   The patient was advised of the complex nature of the diagnosed disorder , the treatment options and the  risks for general health and wellness arising from not treating the condition.   I spent more than 60 minutes of face to face time with the patient.  Greater than 50% of time was spent in counseling and coordination of care. We have discussed the diagnosis and differential and I answered the patient's questions.    Plan:  Treatment plan and additional workup :  Attended sleep study - necessary due to myasthenic co-morbidity, advanced melanoma, patient cannot be treated with immunosupression.  Had pneumonia, bronchitis. Chronic narcotic pain management  Has been treated with IVIG. SPLIT night, I will provide sleep aid.  Referral to medical weight management.  In addition , I will check with Dr.  Marin Olp  About  Ferritin , TIBC and iron saturation level.     Larey Seat, MD 08/25/3149, 7:61 PM  Certified in Neurology by ABPN Certified in [place] by [facility] [address], [place], [place] [address]

## 2017-08-04 ENCOUNTER — Encounter: Payer: Self-pay | Admitting: Physical Therapy

## 2017-08-04 ENCOUNTER — Ambulatory Visit: Payer: BLUE CROSS/BLUE SHIELD | Attending: Family | Admitting: Physical Therapy

## 2017-08-04 DIAGNOSIS — R262 Difficulty in walking, not elsewhere classified: Secondary | ICD-10-CM

## 2017-08-04 DIAGNOSIS — M79605 Pain in left leg: Secondary | ICD-10-CM | POA: Diagnosis present

## 2017-08-04 DIAGNOSIS — I890 Lymphedema, not elsewhere classified: Secondary | ICD-10-CM | POA: Diagnosis present

## 2017-08-04 NOTE — Therapy (Signed)
[name], [place], [address] Phone: [phone]   Fax:  [phone]  Physical Therapy Treatment  Patient Details  Name: [name] MRN: [id number] Date of Birth: Sep 20, 1960 Referring Provider: Princella Pellegrini   Encounter Date: 08/04/2017  PT End of Session - 08/04/17 0853    Visit Number  3    Number of Visits  13    Date for PT Re-Evaluation  09/01/17    PT Start Time  0814 pt arrived late    PT Stop Time  0850    PT Time Calculation (min)  36 min    Activity Tolerance  Patient tolerated treatment well    Behavior During Therapy  [facility] for tasks assessed/performed       Past Medical History:  Diagnosis Date  . Anemia   . Arthritis    back & knees   . Asthma    uses albuterol inhaler if outside, cold weather & exercising   . GERD (gastroesophageal reflux disease)   . History of hiatal hernia   . History of kidney stones    passed spontaneously  . Melanoma ([place])    malignant, left leg,  wide excision, sentinel node, one positive stage 3, as a result- lymphdema, will be following with ONCOLOGY, PET scan- last 2017  . Myasthenia gravis ([place])   . Myasthenia gravis ([place]) 2000  . Pneumonia 2015   hosp. [facility] , treated with IV antibiotics   . Sleep apnea    sleep irregular pattern- no CPAP order solidified yet.     Past Surgical History:  Procedure Laterality Date  . APPENDECTOMY    . BACK SURGERY    . BREAST SURGERY Bilateral 2004   reduction   . EYE SURGERY Bilateral 1999   corneal peel  . MELANOMA EXCISION Left    removed from left leg  . OOPHORECTOMY Left   . THYMECTOMY  2001  . TOTAL KNEE ARTHROPLASTY Left 01/2016  . WRIST SURGERY Left    plate & 6 screws     There were no vitals filed for this visit.  Subjective Assessment - 08/04/17 0816    Subjective  My leg is so swollen. I am not at my job all day. I am doing more from home. The bandages keep sliding down my  leg. I have not gotten measured for garments yet.     Pertinent History  L TKA 01/2016, 03/01/17 rods and screw at L4, L5, L6, history of metastatic malignant melanoma stage IIIb of the left posterior thigh, one positive lymph node diagnosed in September 2015 with excision. Maximum tumor thickness was 1.84 mm, excision was 2.5 cm deep. Surgery on Sept 2015- 16 lymph nodes biopsied, She did not have chemotherapy as she felt at that time the risk outweighed the benefit, She has also had melanomas of the right calf and left upper arm removed by Mohs procedure.     Patient Stated Goals  to get the swelling under control    Currently in Pain?  Yes    Pain Score  6     Pain Location  Leg    Pain Orientation  Left                  Outpatient Rehab from [facility]/22/2017 [facility]  Lymphedema Life Impact Scale Total Score  48.53 %           [facility] Adult PT Treatment/Exercise - 08/04/17 0001  Manual Therapy   Compression Bandaging  instructed pt in self bandaging technique to LLE as follows while therapist applied bandages: thick stockinette, Rosidal soft from foot to groin, 1 6cm at foot, 1 8 cm at ankle, 1 10 cm from ankle to knee, 1 12 cm from ankle to knee, 4 12 cm from just below knee to groin- applied shoe covers over L foot because pt was unable to don flip flops                  PT Long Term Goals - 08/04/17 0857      PT LONG TERM GOAL #1   Title  Pt will receive appropriate garments that she can use to manage her lymphedema independently and ones that will not aggrevate her scar.    Time  4    Period  Weeks    Status  On-going      PT LONG TERM GOAL #2   Title  Pt will be able to independently apply compression bandages to LLE for long term management of lymphedema    Time  4    Period  Weeks    Status  On-going      PT LONG TERM GOAL #3   Title  Pt will demonstrate a 5 cm reduction in circumference of LLE 10 cm proximal to  top of patella to decrease risk of infection    Baseline  68.8    Time  4    Period  Weeks    Status  On-going            Plan - 08/04/17 0853    Clinical Impression Statement  Pt states when she wore the compression bandages at home they kept sliding down and when she had her daughter wrap her, her leg kept going to sleep. She plans on gettting measured for garments next week. Today replaced Artiflex with Rosidal soft to help keep bandages in place and alternated spiral bandages with herringbone pattern to help bandages stay up. Educated pt to leave bandages intact until Sunday to assess for reduction. Also educated pt about reapplying bandages independently after she removes them on Sunday. Pt would benefit from addional skilled PT visits since she was only seen 2 over the last cert period and needs more sessions to progress towards goals.     Rehab Potential  Good    Clinical Impairments Affecting Rehab Potential  large scar impeding lymph flow    PT Frequency  1x / week    PT Duration  4 weeks    PT Treatment/Interventions  ADLs/Self Care Home Management;Taping;Vasopneumatic Device;Compression bandaging;Scar mobilization;Passive range of motion;Manual lymph drainage;Manual techniques;Orthotic Fit/Training;Therapeutic exercise;Therapeutic activities    PT Next Visit Plan  Assess indep with self bandaging to LLE, see if pt was measured for compression garmnets, give lymphedema risk reduction     Consulted and Agree with Plan of Care  Patient       Patient will benefit from skilled therapeutic intervention in order to improve the following deficits and impairments:  Increased edema, Decreased knowledge of precautions, Decreased strength, Decreased scar mobility, Pain  Visit Diagnosis: Lymphedema, not elsewhere classified  Pain in left leg  Difficulty in walking, not elsewhere classified     Problem List Patient Active Problem List   Diagnosis Date Noted  . IDA (iron  deficiency anemia) 06/21/2017  . Spondylolisthesis at L3-L4 level 03/01/2017  . Allergy to environmental factors 09/15/2016  . Chronic low back pain 09/15/2016  .  Degenerative spondylolisthesis 09/15/2016  . Depression 09/15/2016  . Leg edema 09/15/2016  . Lumbosacral spondylosis without myelopathy 09/15/2016  . Osteoarthritis of knees, bilateral 09/15/2016  . Pernicious anemia 09/15/2016  . 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 Malignant melanoma of thigh, left ([place]) Stage III 11/05/2013  . CERVICAL RADICULOPATHY, LEFT 06/04/2008  . MEDIAL EPICONDYLITIS 06/04/2008  . ANEMIA-NOS 03/25/2008  . ALLERGIC RHINITIS 03/25/2008  . Asthma 03/25/2008  . FATIGUE 03/25/2008  . B12 DEFICIENCY 06/04/2007  . RESTLESS LEG SYNDROME 06/04/2007  . MYASTHENIA GRAVIS WITHOUT EXACERBATION 06/04/2007  . GERD 06/04/2007  . IRRITABLE BOWEL SYNDROME 06/04/2007    Allyson Sabal [facility] 08/04/2017, 8:57 AM  [place], [place], [address] Phone: [phone]   Fax:  [phone]  Name: Trenity Pha MRN: [id number] Date of Birth: 05-05-1960  Manus Gunning, PT 08/04/17 8:57 AM

## 2017-08-11 ENCOUNTER — Inpatient Hospital Stay: Payer: BLUE CROSS/BLUE SHIELD

## 2017-08-11 ENCOUNTER — Inpatient Hospital Stay: Payer: BLUE CROSS/BLUE SHIELD | Attending: Hematology & Oncology | Admitting: Family

## 2017-08-11 ENCOUNTER — Ambulatory Visit (HOSPITAL_BASED_OUTPATIENT_CLINIC_OR_DEPARTMENT_OTHER)
Admission: RE | Admit: 2017-08-11 | Discharge: 2017-08-11 | Disposition: A | Discharge status: Home / Self Care | Payer: BLUE CROSS/BLUE SHIELD | Source: Ambulatory Visit | Attending: Family | Admitting: Family

## 2017-08-11 ENCOUNTER — Ambulatory Visit: Payer: BLUE CROSS/BLUE SHIELD | Admitting: Physical Therapy

## 2017-08-11 ENCOUNTER — Telehealth: Payer: Self-pay | Admitting: *Deleted

## 2017-08-11 ENCOUNTER — Encounter: Payer: Self-pay | Admitting: Physical Therapy

## 2017-08-11 ENCOUNTER — Encounter: Payer: Self-pay | Admitting: Family

## 2017-08-11 ENCOUNTER — Other Ambulatory Visit: Payer: Self-pay

## 2017-08-11 VITALS — BP 135/61 | HR 100 | Temp 98.5°F | Resp 18 | Wt 259.0 lb

## 2017-08-11 DIAGNOSIS — C4372 Malignant melanoma of left lower limb, including hip: Secondary | ICD-10-CM

## 2017-08-11 DIAGNOSIS — R059 Cough, unspecified: Secondary | ICD-10-CM

## 2017-08-11 DIAGNOSIS — R262 Difficulty in walking, not elsewhere classified: Secondary | ICD-10-CM

## 2017-08-11 DIAGNOSIS — Z85028 Personal history of other malignant neoplasm of stomach: Secondary | ICD-10-CM

## 2017-08-11 DIAGNOSIS — D508 Other iron deficiency anemias: Secondary | ICD-10-CM

## 2017-08-11 DIAGNOSIS — I890 Lymphedema, not elsewhere classified: Secondary | ICD-10-CM | POA: Insufficient documentation

## 2017-08-11 DIAGNOSIS — R05 Cough: Secondary | ICD-10-CM | POA: Diagnosis not present

## 2017-08-11 DIAGNOSIS — K449 Diaphragmatic hernia without obstruction or gangrene: Secondary | ICD-10-CM | POA: Diagnosis not present

## 2017-08-11 DIAGNOSIS — M79605 Pain in left leg: Secondary | ICD-10-CM

## 2017-08-11 LAB — CBC WITH DIFFERENTIAL (CANCER CENTER ONLY)
BASOS PCT: 1 %
Basophils Absolute: 0 10*3/uL (ref 0.0–0.1)
EOS ABS: 0.3 10*3/uL (ref 0.0–0.5)
Eosinophils Relative: 5 %
HCT: 38.2 % (ref 34.8–46.6)
HEMOGLOBIN: 12.3 g/dL (ref 11.6–15.9)
LYMPHS ABS: 1.7 10*3/uL (ref 0.9–3.3)
Lymphocytes Relative: 29 %
MCH: 27.9 pg (ref 26.0–34.0)
MCHC: 32.2 g/dL (ref 32.0–36.0)
MCV: 86.6 fL (ref 81.0–101.0)
Monocytes Absolute: 0.5 10*3/uL (ref 0.1–0.9)
Monocytes Relative: 8 %
NEUTROS PCT: 57 %
Neutro Abs: 3.4 10*3/uL (ref 1.5–6.5)
Platelet Count: 266 10*3/uL (ref 145–400)
RBC: 4.41 MIL/uL (ref 3.70–5.32)
RDW: 20.1 % — ABNORMAL HIGH (ref 11.1–15.7)
WBC Count: 5.9 10*3/uL (ref 3.9–10.0)

## 2017-08-11 LAB — IRON AND TIBC
IRON: 62 ug/dL (ref 41–142)
SATURATION RATIOS: 20 % — AB (ref 21–57)
TIBC: 315 ug/dL (ref 236–444)
UIBC: 253 ug/dL

## 2017-08-11 LAB — FERRITIN: FERRITIN: 104 ng/mL (ref 9–269)

## 2017-08-11 NOTE — Progress Notes (Signed)
Hematology and Oncology Follow Up Visit  [name] [id number] [birth year] 57 y.o. 08/11/2017   Principle Diagnosis:  Malignant melanoma of the left thigh, stage IIIb, one positive lymph node Melanoma of right calf and left upper arm treated with Mohs procedure  Iron deficiency anemia   Current Therapy:   Observation  IV iron as indicated - last received in May x 2   Interim History:  [name] is here today for follow-up. She is still having a hard time with lymphedema of the left leg and arm. She is in PT and they are measuring her for compression stockings next week. I have also ordered her a sleeve for her left arm. [name] is working on this and will call her to let her know where to go for fitting. She states that she see dermatology in August.  She will contact her old oncologist and have them sent her genetics and PET scan reports.   She has had a persistent dry cough for the last 3-4 months despite taking antibiotics and steroids. She states that when she is in bed at night she feels like something is sitting on her chest. We will get a chest xray today.  She is scheduled to have a sleep study done on Sunday.  No fever, chills, n/v, dizziness, chest pain, palpitations, abdominal pain or changes in bowel or bladder habits.  She has a rash on her left shin that she thinks may be poison ivy. She is going to try using calamine lotion.  No episodes of bleeding, no bruising or petechiae.  No tenderness, numbness or tingling in her extremities. No c/o pain at this time.  She has a good appetite and is staying well hydrated. Her weight is stable.   ECOG Performance Status: 1 - Symptomatic but completely ambulatory  Medications:  Allergies as of 08/11/2017      Reactions   Other Other (See Comments)   Cant take some "mycin" drugs    Tape Other (See Comments)   Blister, irritates skin      Medication List        Accurate as of 08/11/17  9:24 AM. Always use your most  recent med list.          albuterol 108 (90 Base) MCG/ACT inhaler Commonly known as:  PROVENTIL HFA;VENTOLIN HFA Inhale 1 puff into the lungs every 6 (six) hours as needed for wheezing or shortness of breath.   hydrochlorothiazide 12.5 MG capsule Commonly known as:  MICROZIDE Take 1 capsule (12.5 mg total) by mouth daily. Use as needed for swelling   HYDROcodone-acetaminophen 5-325 MG tablet Commonly known as:  NORCO/VICODIN Take 1 tablet by mouth every 6 (six) hours as needed for moderate pain.   ipratropium 0.03 % nasal spray Commonly known as:  ATROVENT Place 2 sprays into the nose 4 (four) times daily.   pantoprazole 40 MG tablet Commonly known as:  PROTONIX Take twice daily for 3 months, then daily   pramipexole 1.5 MG tablet Commonly known as:  MIRAPEX Take 2 tablets (3 mg total) by mouth 2 (two) times daily as needed (for restless leg syndrome).   tiZANidine 4 MG tablet Commonly known as:  ZANAFLEX Take 1 tablet (4 mg total) by mouth every 6 (six) hours as needed for muscle spasms (if unresponsive to cyclobenzaprine).       Allergies:  Allergies  Allergen Reactions  . Other Other (See Comments)    Cant take some "mycin" drugs   . Tape Other (  See Comments)    Blister, irritates skin    Past Medical History, Surgical history, Social history, and Family History were reviewed and updated.  Review of Systems: All other 10 point review of systems is negative.   Physical Exam:  vitals were not taken for this visit.   Wt Readings from Last 3 Encounters:  07/26/17 256 lb (116.1 kg)  07/12/17 260 lb (117.9 kg)  06/15/17 237 lb (107.5 kg)    Ocular: Sclerae unicteric, pupils equal, round and reactive to light Ear-nose-throat: Oropharynx clear, dentition fair Lymphatic: No cervical, supraclavicular or axillary adenopathy Lungs no rales or rhonchi, good excursion bilaterally Heart regular rate and rhythm, no murmur appreciated Abd soft, nontender, positive  bowel sounds, no liver or spleen tip palpated on exam, no fluid wave  MSK no focal spinal tenderness, no joint edema Neuro: non-focal, well-oriented, appropriate affect Breasts: Deferred   Lab Results  Component Value Date   WBC 5.9 08/11/2017   HGB 12.3 08/11/2017   HCT 38.2 08/11/2017   MCV 86.6 08/11/2017   PLT 266 08/11/2017   Lab Results  Component Value Date   FERRITIN 17 06/15/2017   IRON 20 (L) 06/15/2017   TIBC 425 06/15/2017   UIBC 405 06/15/2017   IRONPCTSAT 5 (L) 06/15/2017   Lab Results  Component Value Date   RBC 4.41 08/11/2017   No results found for: KPAFRELGTCHN, LAMBDASER, KAPLAMBRATIO No results found for: IGGSERUM, IGA, IGMSERUM No results found for: Odetta Pink, SPEI   Chemistry      Component Value Date/Time   NA 142 06/15/2017 1338   K 4.0 06/15/2017 1338   CL 105 06/15/2017 1338   CO2 28 06/15/2017 1338   BUN 12 06/15/2017 1338   CREATININE 0.79 06/15/2017 1338      Component Value Date/Time   CALCIUM 9.8 06/15/2017 1338   ALKPHOS 134 06/15/2017 1338   AST 15 06/15/2017 1338   ALT 17 06/15/2017 1338   BILITOT 0.3 06/15/2017 1338      Impression and Plan: [name] is a very pleasant 57 yo caucasian female with history of malignant melanoma stage III of the left posterior thigh, one positive lymph node diagnosed in September 2015 with excision. She also has history of melanoma of the right calf and left upper arm with Moh's surgery. No chemotherapy.  She also has history of iron deficiency and has responded nicely to IV iron.  We will get a chest xray today.  Compression sleeve was ordered.  We will plan to see her back in another 4 months.  She will contact our office with any questions or concerns. We can certainly see her sooner if need be.   Laverna Peace, NP 6/14/20199:24 AM

## 2017-08-11 NOTE — Therapy (Signed)
[place], [place], [address] Phone: [phone]   Fax:  [phone]  Physical Therapy Treatment  Patient Details  Name: [name] MRN: [id number] Date of Birth: 1960-10-21 Referring Provider: Princella Pellegrini   Encounter Date: 08/11/2017  PT End of Session - 08/11/17 1155    Visit Number  4    Number of Visits  13    Date for PT Re-Evaluation  09/01/17    PT Start Time  1105    PT Stop Time  1150    PT Time Calculation (min)  45 min    Activity Tolerance  Patient tolerated treatment well    Behavior During Therapy  [facility] for tasks assessed/performed       Past Medical History:  Diagnosis Date  . Anemia   . Arthritis    back & knees   . Asthma    uses albuterol inhaler if outside, cold weather & exercising   . GERD (gastroesophageal reflux disease)   . History of hiatal hernia   . History of kidney stones    passed spontaneously  . Melanoma ([place])    malignant, left leg,  wide excision, sentinel node, one positive stage 3, as a result- lymphdema, will be following with ONCOLOGY, PET scan- last 2017  . Myasthenia gravis ([place])   . Myasthenia gravis ([place]) 2000  . Pneumonia 2015   hosp. [facility] , treated with IV antibiotics   . Sleep apnea    sleep irregular pattern- no CPAP order solidified yet.     Past Surgical History:  Procedure Laterality Date  . APPENDECTOMY    . BACK SURGERY    . BREAST SURGERY Bilateral 2004   reduction   . EYE SURGERY Bilateral 1999   corneal peel  . MELANOMA EXCISION Left    removed from left leg  . OOPHORECTOMY Left   . THYMECTOMY  2001  . TOTAL KNEE ARTHROPLASTY Left 01/2016  . WRIST SURGERY Left    plate & 6 screws     There were no vitals filed for this visit.  Subjective Assessment - 08/11/17 1107    Subjective  The wrap from the other day last for an hour and a half and then it slid all the way down. The doctor wants to make sure I do  the massage today.     Pertinent History  L TKA 01/2016, 03/01/17 rods and screw at L4, L5, L6, history of metastatic malignant melanoma stage IIIb of the left posterior thigh, one positive lymph node diagnosed in September 2015 with excision. Maximum tumor thickness was 1.84 mm, excision was 2.5 cm deep. Surgery on Sept 2015- 16 lymph nodes biopsied, She did not have chemotherapy as she felt at that time the risk outweighed the benefit, She has also had melanomas of the right calf and left upper arm removed by Mohs procedure.     Patient Stated Goals  to get the swelling under control    Currently in Pain?  Yes                  Outpatient Re[facility]  Lymphedema Life Impact Scale Total Score  48.53 %           [facility] Adult PT Treatment/Exercise - 08/11/17 0001      Manual Therapy   Manual Therapy  Manual Lymphatic Drainage (MLD)    Manual Lymphatic Drainage (MLD)  short neck, superfical and  deep abdominals, left axillary nodes, establishment of inguino axillary pathway, LLE working proximal to distal then retracing all steps spending extra time at left thigh             PT Education - 08/11/17 1200    Education Details  anatomy and drainage areas of lymphatic system    Person(s) Educated  Patient    Methods  Explanation    Comprehension  Verbalized understanding          PT Long Term Goals - 08/04/17 0857      PT LONG TERM GOAL #1   Title  Pt will receive appropriate garments that she can use to manage her lymphedema independently and ones that will not aggrevate her scar.    Time  4    Period  Weeks    Status  On-going      PT LONG TERM GOAL #2   Title  Pt will be able to independently apply compression bandages to LLE for long term management of lymphedema    Time  4    Period  Weeks    Status  On-going      PT LONG TERM GOAL #3   Title  Pt will demonstrate a 5 cm reduction in circumference of  LLE 10 cm proximal to top of patella to decrease risk of infection    Baseline  68.8    Time  4    Period  Weeks    Status  On-going            Plan - 08/11/17 1155    Clinical Impression Statement  Pt is having swelling now in her R and L LEs, RUE, and right side of neck. Her doctor wanted [name] to focus on MLD today so MLD was performed to LLE and pt was educated to bandage after she uses her pump tonight. There is currently no explanation for pt's neck and LUE swelling. Issued pt information about Dr. Roney Marion in [place] who deals with lymphedema. Also educated pt that the thoracic duct is the area the drains 75% of the body and that is where pt is having swelling. Her RUE, head and neck are not swelling and this is drained by the right lymphatic duct. Pt states she has not been feeling well overall and really wants an answer as to why she is having so much swelling. Her LLE swelling is due to her hx of melanoma but the LUE and neck swelling can not be explained by this. Pt to contact Dr. Roney Marion.     Rehab Potential  Good    Clinical Impairments Affecting Rehab Potential  large scar impeding lymph flow    PT Frequency  1x / week    PT Duration  4 weeks    PT Treatment/Interventions  ADLs/Self Care Home Management;Taping;Vasopneumatic Device;Compression bandaging;Scar mobilization;Passive range of motion;Manual lymph drainage;Manual techniques;Orthotic Fit/Training;Therapeutic exercise;Therapeutic activities    PT Next Visit Plan  MLD to LLE, see if pt called Dr. Roney Marion, Assess indep with self bandaging to LLE, see if pt was measured for compression garmnets, give lymphedema risk reduction     Consulted and Agree with Plan of Care  Patient       Patient will benefit from skilled therapeutic intervention in order to improve the following deficits and impairments:  Increased edema, Decreased knowledge of precautions, Decreased strength, Decreased scar mobility, Pain  Visit  Diagnosis: Lymphedema, not elsewhere classified  Pain in left leg  Difficulty in walking, not elsewhere  classified     Problem List Patient Active Problem List   Diagnosis Date Noted  . IDA (iron deficiency anemia) 06/21/2017  . Spondylolisthesis at L3-L4 level 03/01/2017  . Allergy to environmental factors 09/15/2016  . Chronic low back pain 09/15/2016  . Degenerative spondylolisthesis 09/15/2016  . Depression 09/15/2016  . Leg edema 09/15/2016  . Lumbosacral spondylosis without myelopathy 09/15/2016  . Osteoarthritis of knees, bilateral 09/15/2016  . Pernicious anemia 09/15/2016  . 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 Malignant melanoma of thigh, left ([place]) Stage III 11/05/2013  . CERVICAL RADICULOPATHY, LEFT 06/04/2008  . MEDIAL EPICONDYLITIS 06/04/2008  . ANEMIA-NOS 03/25/2008  . ALLERGIC RHINITIS 03/25/2008  . Asthma 03/25/2008  . FATIGUE 03/25/2008  . B12 DEFICIENCY 06/04/2007  . RESTLESS LEG SYNDROME 06/04/2007  . MYASTHENIA GRAVIS WITHOUT EXACERBATION 06/04/2007  . GERD 06/04/2007  . IRRITABLE BOWEL SYNDROME 06/04/2007    Allyson Sabal [facility] 08/11/2017, 12:00 PM  [place], [place], [address] Phone: [phone]   Fax:  [phone]  Name: Rae Plotner MRN: [id number] Date of Birth: 06-01-1960  Manus Gunning, PT 08/11/17 12:00 PM

## 2017-08-11 NOTE — Telephone Encounter (Addendum)
Patient is aware of results  ----- Message from Eliezer Bottom, NP sent at 08/11/2017 11:16 AM EDT ----- Regarding: Chest xray Chest xray negative. Thank you!  Sarah   ----- Message ----- From: Buel Ream, Rad Results In Sent: 08/11/2017  10:53 AM To: Eliezer Bottom, NP

## 2017-08-13 ENCOUNTER — Ambulatory Visit (INDEPENDENT_AMBULATORY_CARE_PROVIDER_SITE_OTHER): Payer: BLUE CROSS/BLUE SHIELD | Admitting: Neurology

## 2017-08-13 DIAGNOSIS — G4733 Obstructive sleep apnea (adult) (pediatric): Secondary | ICD-10-CM

## 2017-08-13 DIAGNOSIS — C4372 Malignant melanoma of left lower limb, including hip: Secondary | ICD-10-CM

## 2017-08-13 DIAGNOSIS — J99 Respiratory disorders in diseases classified elsewhere: Secondary | ICD-10-CM

## 2017-08-13 DIAGNOSIS — G709 Myoneural disorder, unspecified: Secondary | ICD-10-CM

## 2017-08-13 DIAGNOSIS — G4734 Idiopathic sleep related nonobstructive alveolar hypoventilation: Secondary | ICD-10-CM

## 2017-08-13 DIAGNOSIS — J988 Other specified respiratory disorders: Secondary | ICD-10-CM

## 2017-08-13 DIAGNOSIS — Z6841 Body Mass Index (BMI) 40.0 and over, adult: Secondary | ICD-10-CM

## 2017-08-13 DIAGNOSIS — Z9089 Acquired absence of other organs: Secondary | ICD-10-CM

## 2017-08-13 DIAGNOSIS — G7000 Myasthenia gravis without (acute) exacerbation: Secondary | ICD-10-CM

## 2017-08-18 ENCOUNTER — Telehealth: Payer: Self-pay | Admitting: Family Medicine

## 2017-08-18 DIAGNOSIS — R52 Pain, unspecified: Secondary | ICD-10-CM

## 2017-08-18 MED ORDER — HYDROCODONE-ACETAMINOPHEN 5-325 MG PO TABS: ORAL_TABLET | ORAL | 0 refills | Status: DC

## 2017-08-18 MED ORDER — HYDROCODONE-ACETAMINOPHEN 5-325 MG PO TABS: 1.0000 | ORAL_TABLET | Freq: Four times a day (QID) | ORAL | 0 refills | Status: DC | PRN

## 2017-08-18 NOTE — Telephone Encounter (Signed)
Pt last seen here last month UDS done last month [facility]:  07/12/2017  4  07/12/2017  Hydrocodone-Acetamin 5-325 Mg  60 15 Je Cop  [id number]  [place] ([id number])  0 20.00 MME Comm Ins  [place]  05/22/2017  4  05/22/2017  Hydrocodone-Chlorphen Er Susp  50 5 [place]  [id number]  [place] ([id number])  0 20.00 MME Comm Ins  [place]  05/17/2017  4  05/17/2017  Hydrocodone-Acetamin 5-325 Mg  30 5 Pa Wri  [id number]  [place] ([id number])  0 30.00 MME Comm Ins  [place]  04/28/2017  4  04/28/2017  Hydrocodone-Acetamin 5-325 Mg  30 7 [place]  [id number]  [place] ([id number])  0 21.43 MME Comm Ins  [place]  04/07/2017  3  11/21/2016  Zolpidem Tart Er 12.5 Mg Tab  30 [place]  [id number]  [place] ([id number])  1      Ok to refill today

## 2017-08-18 NOTE — Procedures (Signed)
PATIENT'S NAME:  [name], [name] DOB:      1960/06/06      MR#:    [id number]     DATE OF RECORDING: 08/13/2017 REFERRING M.D.:  [name], M.D. 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  [name] was diagnosed with MG (myasthenia gravis) by single-fiber EMG in the year 2000.  She first noticed diplopia but also shortness of breath and a general feeling of weakness as well as exercise intolerance. She was seronegative but was worked up by Dr. Tedra [name], at [facility]. The patient had intermittent problems with immune-suppression, she is status post thymectomy and suffered related infections, bronchitis and pneumonia which in return make the myasthenia exacerbate.    The patient has some Mestinon at home (as needed) but she is not on a daily medication.  She had been immunosuppressed with Imuran, the last one was CellCept, has also had long-standing steroid treatment up to 80 mg daily which caused a lot of weight gain and unhappiness. She lived in [place], [place].  Not on immune-suppression now- as she was diagnosed with malignant melanoma, stage 3 A, with lymph node metastasis. Lymphedema in the left leg. The diagnosis was made in [place], [place]- Dr. Duayne [name]. Soon after she moved to [place], [place].  She was followed by Dr. Rosario [name] in [place], [place]. During her time in [place], her physicians also repeated the serum testing for myasthenia and found again no antibodies.  Her oncologist was of the opinion that she no longer has the disease; however a single-fiber EMG would be needed to confirm. The patient endorsed the Epworth Sleepiness Scale at 16/24 points and the FSS at 36 points.  The patient's weight 256 pounds with a height of 67 (inches), resulting in a BMI of 40.1 kg/m2. The patient's neck circumference measured  16 inches.  CURRENT MEDICATIONS: Albuterol, Hydrochlorothiazide, Hydrocodone, Ipratropium, Pantoprazole, Pramipexole and Tizanidine.  PROCEDURE:  This is a multichannel digital polysomnogram utilizing the Somnostar 11.2 system.  Electrodes and sensors were applied and monitored per AASM Specifications.   EEG, EOG, Chin and Limb EMG, were sampled at 200 Hz.  ECG, Snore and Nasal Pressure, Thermal Airflow, Respiratory Effort, CPAP Flow and Pressure, Oximetry was sampled at 50 Hz. Digital video and audio were recorded.      BASELINE STUDY WITHOUT CPAP RESULTS: Lights Out was at 22:32 and Lights On at 05:02.  Total recording time (TRT) was 241, with a total sleep time (TST) of 131.5 minutes. The patient's sleep latency was 109 minutes. REM void. The sleep efficiency was poor at  54.6 %.    SLEEP ARCHITECTURE: WASO (Wake after sleep onset) was 42.5 minutes, Stage N1 was 18 minutes, Stage N2 was 113.5 minutes, Stage N3 was 0 minutes and Stage R (REM sleep) was 0 minutes.  The percentages were Stage N1 13.7%, Stage N2 86.3%, Stage N3 0% and Stage R (REM sleep) 0%.   RESPIRATORY ANALYSIS:  There were a total of 139 respiratory events:  1 obstructive apnea, 0 central apneas and 0 mixed apneas with a total of 1 apneas and an apnea index (AI) of 0.5. There were 138 hypopneas with a hypopnea index of 63/h. The patient also had 0 respiratory event related arousals (RERAs).  Snoring was noted. The total APNEA/HYPOPNEA INDEX (AHI) was 63.4 /hour and the total RESPIRATORY DISTURBANCE INDEX was 63.4 /hour.  0 events occurred in  REM sleep and 276 events in NREM. The REM AHI was 0, /hour versus a non-REM AHI of 63.4 /hour. The patient spent 158.5 minutes sleep time in the supine position 117 minutes in non-supine. The supine AHI was 54.1 /hour versus a non-supine AHI of 71.5 /hour.  OXYGEN SATURATION & C02:  The wake baseline 02 saturation was 94%, with the lowest being 78%. Time spent below 89% saturation equaled 88  minutes. The average [place] CO2 during sleep was 43 torr, no REM sleep was recorded.   PERIODIC LIMB MOVEMENTS: The patient had a total of 0 Periodic Limb Movements.   The arousals were noted as: 3 were spontaneous, 0 were associated with PLMs, and 140 were associated with respiratory events. Audio and video analysis did not show any abnormal behaviors, phonations or vocalizations, but the patient remained restless.  The patient took one bathroom break. EKG with variable beat to beat interval, p wave was preserved.   TITRATION STUDY WITH CPAP RESULTS:   CPAP was initiated at 5 cmH20 with heated humidity per AASM split night standards and pressure was advanced to 10/10 cmH20 because of hypopneas, apneas and desaturations.  At a PAP pressure of 10 cmH20, there was a reduction of the AHI to 0.0 /hour. The patient could neither tolerate a nasal pillow nor a large FFM, settled finally with a small FFM. Total recording time (TRT) was 149 minutes, with a total sleep time (TST) of 144 minutes. The patient's sleep latency was 11 minutes. REM latency was 0 minutes.  The sleep efficiency was 96.6 %.    SLEEP ARCHITECTURE: Wake after sleep was 0.5 minutes, Stage N1 2 minutes, Stage N2 142 minutes, Stage N3 0 minutes and Stage R (REM sleep) 0 minutes. The percentages were: Stage N1 1.4%, Stage N2 98.6%, Stage N3 0% and Stage R (REM sleep) 0%.  RESPIRATORY ANALYSIS:  There were a total of 19 respiratory events: 0 obstructive apneas, 0 central apneas and 0 mixed apneas with a total of 0 apneas and an apnea index (AI) of 0. There were 19 hypopneas with a hypopnea index of 7.9 /hour. The patient also had 0 respiratory event related arousals (RERAs).     The total APNEA/HYPOPNEA INDEX (AHI) was 7.9 /hour and the total RESPIRATORY DISTURBANCE INDEX was 7.9 /hour. REM sleep was not achieved. The patient spent 68% of total sleep time in the supine position. The supine AHI was 1.8 /hour, versus a non-supine AHI of 20.6  /hour.  OXYGEN SATURATION & C02:  The wake baseline 02 saturation was 94%, with the lowest being 85%. Time spent below 89% saturation equaled 73 minutes.  PERIODIC LIMB MOVEMENTS:    The patient had a total of 0 Periodic Limb Movements. The arousals were noted as: 3 were spontaneous, 0 were associated with PLMs, and 18 were associated with respiratory events.    POLYSOMNOGRAPHY IMPRESSION :   1. Severe Obstructive Sleep Apnea (OSA) at AHI 63.4/h with hypoxemia and some snoring. REM sleep was not seen.  2. CPAP improved the sleep fragmentation, Insomnia and restlessness. A sleep efficiency of close to 100% resulted.    RECOMMENDATIONS: CPAP to be set at 6 through 11 cm water setting auto-titration capable device, 1 cm EPR, heated humidity. The patient was fitted with a Res MED air fit F 30 in medium size. Compliance to CPAP is defined as 4 or more hours of daily use of CPAP. A follow up appointment will be scheduled in the Sleep Clinic at [facility], [phone].      I certify that I have reviewed the entire raw data recording prior to the issuance of this report in accordance with the Standards of Accreditation of the American Academy of Sleep Medicine (AASM)      Larey Seat, M.D.   08-18-2017  Diplomat, American Board of Psychiatry and Neurology  Diplomat, [place] of Sleep Medicine Medical Director, [place] [facility]

## 2017-08-18 NOTE — Addendum Note (Signed)
Addended by: Larey Seat on: 08/18/2017 01:39 PM   Modules accepted: Orders

## 2017-08-18 NOTE — Telephone Encounter (Signed)
Refill request for hydrocodone.

## 2017-08-18 NOTE — Telephone Encounter (Signed)
Copied from [place] [phone]. Topic: Quick Communication - Rx Refill/Question >> Aug 18, 2017  1:38 PM Carolyn Stare wrote: Medication  HYDROcodone-acetaminophen (NORCO/VICODIN) 5-325 MG tablet   Preferred Pharmacy  [facility]   Agent: Please be advised that RX refills may take up to 3 business days. We ask that you follow-up with your pharmacy.

## 2017-08-21 ENCOUNTER — Encounter: Payer: Self-pay | Admitting: Physical Therapy

## 2017-08-21 ENCOUNTER — Ambulatory Visit: Payer: BLUE CROSS/BLUE SHIELD | Admitting: Physical Therapy

## 2017-08-21 ENCOUNTER — Other Ambulatory Visit: Payer: Self-pay | Admitting: Internal Medicine

## 2017-08-21 ENCOUNTER — Other Ambulatory Visit: Payer: Self-pay | Admitting: Physician Assistant

## 2017-08-21 DIAGNOSIS — M79605 Pain in left leg: Secondary | ICD-10-CM

## 2017-08-21 DIAGNOSIS — R262 Difficulty in walking, not elsewhere classified: Secondary | ICD-10-CM

## 2017-08-21 DIAGNOSIS — I890 Lymphedema, not elsewhere classified: Secondary | ICD-10-CM

## 2017-08-21 NOTE — Therapy (Signed)
[place], [place], [address] Phone: [phone]   Fax:  [phone]  Physical Therapy Treatment  Patient Details  Name: [name] MRN: [id number] Date of Birth: 03-28-1960 Referring Provider: Princella Pellegrini   Encounter Date: 08/21/2017  PT End of Session - 08/21/17 1616    Visit Number  5    Number of Visits  13    Date for PT Re-Evaluation  09/01/17    PT Start Time  1526    PT Stop Time  1610    PT Time Calculation (min)  44 min    Activity Tolerance  Patient tolerated treatment well    Behavior During Therapy  [facility] for tasks assessed/performed       Past Medical History:  Diagnosis Date  . Anemia   . Arthritis    back & knees   . Asthma    uses albuterol inhaler if outside, cold weather & exercising   . GERD (gastroesophageal reflux disease)   . History of hiatal hernia   . History of kidney stones    passed spontaneously  . Melanoma ([place])    malignant, left leg,  wide excision, sentinel node, one positive stage 3, as a result- lymphdema, will be following with ONCOLOGY, PET scan- last 2017  . Myasthenia gravis ([place])   . Myasthenia gravis ([place]) 2000  . Pneumonia 2015   hosp. [facility] , treated with IV antibiotics   . Sleep apnea    sleep irregular pattern- no CPAP order solidified yet.     Past Surgical History:  Procedure Laterality Date  . APPENDECTOMY    . BACK SURGERY    . BREAST SURGERY Bilateral 2004   reduction   . EYE SURGERY Bilateral 1999   corneal peel  . MELANOMA EXCISION Left    removed from left leg  . OOPHORECTOMY Left   . THYMECTOMY  2001  . TOTAL KNEE ARTHROPLASTY Left 01/2016  . WRIST SURGERY Left    plate & 6 screws     There were no vitals filed for this visit.  Subjective Assessment - 08/21/17 1526    Subjective  I got measured for a sleeve, custom left thigh high and a pair of leggings for both LEs. I might be able to get garments  sometime this week.     Pertinent History  L TKA 01/2016, 03/01/17 rods and screw at L4, L5, L6, history of metastatic malignant melanoma stage IIIb of the left posterior thigh, one positive lymph node diagnosed in September 2015 with excision. Maximum tumor thickness was 1.84 mm, excision was 2.5 cm deep. Surgery on Sept 2015- 16 lymph nodes biopsied, She did not have chemotherapy as she felt at that time the risk outweighed the benefit, She has also had melanomas of the right calf and left upper arm removed by Mohs procedure.     Patient Stated Goals  to get the swelling under control    Currently in Pain?  Yes    Pain Score  6     Pain Location  Leg    Pain Orientation  Left    Pain Descriptors / Indicators  Aching                  Outpatient [facility]  Lymphedema Life Impact Scale Total Score  48.53 %           [facility] Adult PT Treatment/Exercise - 08/21/17 0001  Manual Therapy   Edema Management  Educated pt about Tribute night for UE swelling and CircAid for LE swelling    Manual Lymphatic Drainage (MLD)  short neck, superfical and deep abdominals, left axillary nodes, establishment of inguino axillary pathway, LLE working proximal to distal then retracing all steps spending extra time at left thigh                  PT Long Term Goals - 08/04/17 0857      PT LONG TERM GOAL #1   Title  Pt will receive appropriate garments that she can use to manage her lymphedema independently and ones that will not aggrevate her scar.    Time  4    Period  Weeks    Status  On-going      PT LONG TERM GOAL #2   Title  Pt will be able to independently apply compression bandages to LLE for long term management of lymphedema    Time  4    Period  Weeks    Status  On-going      PT LONG TERM GOAL #3   Title  Pt will demonstrate a 5 cm reduction in circumference of LLE 10 cm proximal to top of patella to decrease  risk of infection    Baseline  68.8    Time  4    Period  Weeks    Status  On-going            Plan - 08/21/17 1616    Clinical Impression Statement  Pt has been measured for compression garments and is awaiting their arrival. Continued to focus on MLD today to LLE. Pt has very limited skin mobility in lower leg due to increased swelling. She plans on making an appointment with Dr Roney Marion once she figures out her schedule.     Rehab Potential  Good    Clinical Impairments Affecting Rehab Potential  large scar impeding lymph flow    PT Frequency  1x / week    PT Duration  4 weeks    PT Treatment/Interventions  ADLs/Self Care Home Management;Taping;Vasopneumatic Device;Compression bandaging;Scar mobilization;Passive range of motion;Manual lymph drainage;Manual techniques;Orthotic Fit/Training;Therapeutic exercise;Therapeutic activities    PT Next Visit Plan  MLD to LLE, see if pt called Dr. Roney Marion, Assess indep with self bandaging to LLE, see if compression garmnets arrived, give lymphedema risk reduction     Consulted and Agree with Plan of Care  Patient       Patient will benefit from skilled therapeutic intervention in order to improve the following deficits and impairments:  Increased edema, Decreased knowledge of precautions, Decreased strength, Decreased scar mobility, Pain  Visit Diagnosis: Lymphedema, not elsewhere classified  Difficulty in walking, not elsewhere classified  Pain in left leg     Problem List Patient Active Problem List   Diagnosis Date Noted  . IDA (iron deficiency anemia) 06/21/2017  . Spondylolisthesis at L3-L4 level 03/01/2017  . Allergy to environmental factors 09/15/2016  . Chronic low back pain 09/15/2016  . Degenerative spondylolisthesis 09/15/2016  . Depression 09/15/2016  . Leg edema 09/15/2016  . Lumbosacral spondylosis without myelopathy 09/15/2016  . Osteoarthritis of knees, bilateral 09/15/2016  . Pernicious anemia 09/15/2016  .  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 Malignant melanoma of thigh, left ([place]) Stage III 11/05/2013  . CERVICAL RADICULOPATHY, LEFT 06/04/2008  . MEDIAL EPICONDYLITIS 06/04/2008  . ANEMIA-NOS 03/25/2008  . ALLERGIC RHINITIS 03/25/2008  . Asthma 03/25/2008  .  FATIGUE 03/25/2008  . B12 DEFICIENCY 06/04/2007  . RESTLESS LEG SYNDROME 06/04/2007  . MYASTHENIA GRAVIS WITHOUT EXACERBATION 06/04/2007  . GERD 06/04/2007  . IRRITABLE BOWEL SYNDROME 06/04/2007    Allyson Sabal [facility] 08/21/2017, 4:18 PM  [place], [place], [address] Phone: [phone]   Fax:  [phone]  Name: [name] MRN: [id number] Date of Birth: Oct 08, 1960  Manus Gunning, PT 08/21/17 4:18 PM

## 2017-08-22 ENCOUNTER — Telehealth: Payer: Self-pay

## 2017-08-22 NOTE — Telephone Encounter (Signed)
-----   Message from Larey Seat, MD sent at 08/18/2017  1:39 PM EDT ----- POLYSOMNOGRAPHY IMPRESSION :   1. Severe Obstructive Sleep Apnea (OSA) at AHI 63.4/h with  hypoxemia and some snoring. REM sleep was not seen.  2. CPAP improved the sleep fragmentation, Insomnia and  restlessness. A sleep efficiency of close to 100% resulted.   RECOMMENDATIONS: CPAP to be set at 6 through 11 cm water setting  auto-titration capable device, 1 cm EPR, heated humidity. The  patient was fitted with a Res MED air fit F 30 in medium size. Compliance to CPAP is defined as 4 or more hours of daily use of  CPAP. A follow up appointment will be scheduled in the Sleep Clinic at  [facility], [phone].

## 2017-08-22 NOTE — Telephone Encounter (Signed)
I called pt to discuss her sleep study results. No answer, left a message asking her to call me back. 

## 2017-08-22 NOTE — Telephone Encounter (Signed)
I called pt. I advised pt that Dr. Brett Fairy reviewed their sleep study results and found that pt has severe osa with hypoxemia, no REM sleep, but cpap improved pt's sleep fragmentation. Dr. Brett Fairy recommends that pt start a cpap at home. I reviewed PAP compliance expectations with the pt. Pt is agreeable to starting a CPAP. I advised pt that an order will be sent to a DME, [facility], and [facility] will call the pt within about one week after they file with the pt's insurance. [facility] will show the pt how to use the machine, fit for masks, and troubleshoot the CPAP if needed. A follow up appt was made for insurance purposes with Hoyle Sauer, NP on 12/13/17 at 8:15am. Pt verbalized understanding to arrive 15 minutes early and bring their CPAP. A letter with all of this information in it will be mailed to the pt as a reminder. I verified with the pt that the address we have on file is correct. Pt verbalized understanding of results. Pt had no questions at this time but was encouraged to call back if questions arise.

## 2017-08-23 ENCOUNTER — Ambulatory Visit: Payer: BLUE CROSS/BLUE SHIELD | Admitting: Physical Therapy

## 2017-08-23 ENCOUNTER — Ambulatory Visit (INDEPENDENT_AMBULATORY_CARE_PROVIDER_SITE_OTHER): Payer: BLUE CROSS/BLUE SHIELD | Admitting: Internal Medicine

## 2017-08-23 ENCOUNTER — Encounter: Payer: Self-pay | Admitting: Internal Medicine

## 2017-08-23 VITALS — BP 130/68 | HR 71 | Temp 98.5°F | Wt 260.0 lb

## 2017-08-23 DIAGNOSIS — L309 Dermatitis, unspecified: Secondary | ICD-10-CM

## 2017-08-23 DIAGNOSIS — R600 Localized edema: Secondary | ICD-10-CM

## 2017-08-23 MED ORDER — TRIAMCINOLONE ACETONIDE 0.1 % EX CREA: 1.0000 "application " | TOPICAL_CREAM | Freq: Two times a day (BID) | CUTANEOUS | 0 refills | Status: DC

## 2017-08-23 NOTE — Patient Instructions (Signed)
Triamcinolone cream.  Apply topically to areas of rash and itching to the left leg twice daily as needed  Use a nonsedating antihistamine such as Claritin or Zyrtec once daily  Call or return to clinic prn if these symptoms worsen or fail to improve as anticipated.

## 2017-08-23 NOTE — Progress Notes (Signed)
Subjective:    Patient ID: [name], female    DOB: 02/24/1961, 57 y.o.   MRN: [id number]  HPI 57 year old patient who has chronic lymphedema involving the left leg.  For the past week she has noticed some pruritus and rash involving her left leg.  She feels the edema has worsened slightly.  The rash began as small vesicles that ruptured and a subsequent dermatitis has been quite pruritic  Past Medical History:  Diagnosis Date  . Anemia   . Arthritis    back & knees   . Asthma    uses albuterol inhaler if outside, cold weather & exercising   . GERD (gastroesophageal reflux disease)   . History of hiatal hernia   . History of kidney stones    passed spontaneously  . Melanoma ([name])    malignant, left leg,  wide excision, sentinel node, one positive stage 3, as a result- lymphdema, will be following with ONCOLOGY, PET scan- last 2017  . Myasthenia gravis ([name])   . Myasthenia gravis ([name]) 2000  . Pneumonia 2015   hosp. [facility] , treated with IV antibiotics   . Sleep apnea    sleep irregular pattern- no CPAP order solidified yet.      Social History   Socioeconomic History  . Marital status: Married    Spouse name: Not on file  . Number of children: 2  . Years of education: Not on file  . Highest education level: Not on file  Occupational History  . Occupation: clinical research  Social Needs  . Financial resource strain: Not on file  . Food insecurity:    Worry: Not on file    Inability: Not on file  . Transportation needs:    Medical: Not on file    Non-medical: Not on file  Tobacco Use  . Smoking status: Never Smoker  . Smokeless tobacco: Never Used  Substance and Sexual Activity  . Alcohol use: Yes    Alcohol/week: 0.6 oz    Types: 1 Standard drinks or equivalent per week    Comment: occassionally  . Drug use: No  . Sexual activity: Yes    Birth control/protection: None  Lifestyle  . Physical activity:    Days per week: Not on file    Minutes  per session: Not on file  . Stress: Not on file  Relationships  . Social connections:    Talks on phone: Not on file    Gets together: Not on file    Attends religious service: Not on file    Active member of club or organization: Not on file    Attends meetings of clubs or organizations: Not on file    Relationship status: Not on file  . Intimate partner violence:    Fear of current or ex partner: Not on file    Emotionally abused: Not on file    Physically abused: Not on file    Forced sexual activity: Not on file  Other Topics Concern  . Not on file  Social History Narrative   ** Merged History Encounter **        Past Surgical History:  Procedure Laterality Date  . APPENDECTOMY    . BACK SURGERY    . BREAST SURGERY Bilateral 2004   reduction   . EYE SURGERY Bilateral 1999   corneal peel  . MELANOMA EXCISION Left    removed from left leg  . OOPHORECTOMY Left   . THYMECTOMY  2001  .  TOTAL KNEE ARTHROPLASTY Left 01/2016  . WRIST SURGERY Left    plate & 6 screws     Family History  Problem Relation Age of Onset  . COPD Mother   . Lung cancer Father   . Diverticulitis Father   . Colitis Neg Hx     Allergies  Allergen Reactions  . Other Other (See Comments)    Cant take some "mycin" drugs   . Tape Other (See Comments)    Blister, irritates skin    Current Outpatient Medications on File Prior to Visit  Medication Sig Dispense Refill  . albuterol (PROVENTIL HFA;VENTOLIN HFA) 108 (90 Base) MCG/ACT inhaler Inhale 1 puff into the lungs every 6 (six) hours as needed for wheezing or shortness of breath. 1 Inhaler 6  . hydrochlorothiazide (MICROZIDE) 12.5 MG capsule Take 1 capsule (12.5 mg total) by mouth daily. Use as needed for swelling 30 capsule 6  . HYDROcodone-acetaminophen (NORCO/VICODIN) 5-325 MG tablet Take 1 tablet by mouth every 6 hours as needed for moderate pain.  Ok to fill in 30 days 60 tablet 0  . HYDROcodone-acetaminophen (NORCO/VICODIN) 5-325 MG  tablet Take 1 tablet by mouth every 6 (six) hours as needed for moderate pain. 60 tablet 0  . hyoscyamine (LEVSIN SL) 0.125 MG SL tablet PLACE 1 TABLET UNDER THE TONGUE EVERY 4 HOURS AS NEEDED FOR CRAMPING. 180 tablet 0  . ipratropium (ATROVENT) 0.03 % nasal spray Place 2 sprays into the nose 4 (four) times daily. 30 mL 6  . pantoprazole (PROTONIX) 40 MG tablet Take twice daily for 3 months, then daily 60 tablet 11  . pramipexole (MIRAPEX) 1.5 MG tablet Take 2 tablets (3 mg total) by mouth 2 (two) times daily as needed (for restless leg syndrome). 120 tablet 2  . tiZANidine (ZANAFLEX) 4 MG tablet Take 1 tablet (4 mg total) by mouth every 6 (six) hours as needed for muscle spasms (if unresponsive to cyclobenzaprine). (Patient taking differently: Take 4-6 mg by mouth every 6 (six) hours as needed for muscle spasms (if unresponsive to cyclobenzaprine). ) 30 tablet 0   No current facility-administered medications on file prior to visit.     BP 130/68 (BP Location: Right Arm, Patient Position: Sitting, Cuff Size: Large)   Pulse 71   Temp 98.5 F (36.9 C) (Oral)   Wt 260 lb (117.9 kg)   SpO2 94%   BMI 40.72 kg/m     Review of Systems  Constitutional: Negative.   Respiratory: Negative for shortness of breath.   Cardiovascular: Positive for leg swelling.  Skin: Positive for rash.       Objective:   Physical Exam  Constitutional: She appears well-developed and well-nourished.  Weight 260  Cardiovascular: Normal rate and regular rhythm.  Pulmonary/Chest: Effort normal and breath sounds normal.  Musculoskeletal: She exhibits edema.  Skin:  prominent left leg lymphedema.  Scattered 2 to 3 mm crusted lesions over the anterolateral leg          Assessment & Plan:   Chronic left leg lymphedema/dermatitis.  Will treat with topical triamcinolone twice daily as needed.  Continue efforts at lymphedema control  Marletta Lor

## 2017-08-28 ENCOUNTER — Ambulatory Visit: Payer: BLUE CROSS/BLUE SHIELD | Attending: Family | Admitting: Physical Therapy

## 2017-08-28 DIAGNOSIS — R262 Difficulty in walking, not elsewhere classified: Secondary | ICD-10-CM | POA: Insufficient documentation

## 2017-08-28 DIAGNOSIS — I890 Lymphedema, not elsewhere classified: Secondary | ICD-10-CM | POA: Insufficient documentation

## 2017-08-28 DIAGNOSIS — M79605 Pain in left leg: Secondary | ICD-10-CM | POA: Insufficient documentation

## 2017-08-30 ENCOUNTER — Encounter: Payer: BLUE CROSS/BLUE SHIELD | Admitting: Physical Therapy

## 2017-09-04 ENCOUNTER — Encounter: Payer: Self-pay | Admitting: Rehabilitation

## 2017-09-04 ENCOUNTER — Ambulatory Visit: Payer: BLUE CROSS/BLUE SHIELD | Admitting: Rehabilitation

## 2017-09-04 DIAGNOSIS — M79605 Pain in left leg: Secondary | ICD-10-CM | POA: Diagnosis present

## 2017-09-04 DIAGNOSIS — I890 Lymphedema, not elsewhere classified: Secondary | ICD-10-CM | POA: Diagnosis not present

## 2017-09-04 DIAGNOSIS — R262 Difficulty in walking, not elsewhere classified: Secondary | ICD-10-CM | POA: Diagnosis present

## 2017-09-04 NOTE — Therapy (Signed)
[place], [place], [address] Phone: [phone]   Fax:  [phone]  Physical Therapy Treatment  Patient Details  Name: [name] MRN: [id number] Date of Birth: Jan 20, 1961 Referring Provider: Princella Pellegrini   Encounter Date: 09/04/2017  PT End of Session - 09/04/17 1015    Visit Number  6    Number of Visits  13    Date for PT Re-Evaluation  09/01/17    PT Start Time  0934    PT Stop Time  1015    PT Time Calculation (min)  41 min    Activity Tolerance  Patient tolerated treatment well    Behavior During Therapy  [facility] for tasks assessed/performed       Past Medical History:  Diagnosis Date  . Anemia   . Arthritis    back & knees   . Asthma    uses albuterol inhaler if outside, cold weather & exercising   . GERD (gastroesophageal reflux disease)   . History of hiatal hernia   . History of kidney stones    passed spontaneously  . Melanoma ([place])    malignant, left leg,  wide excision, sentinel node, one positive stage 3, as a result- lymphdema, will be following with ONCOLOGY, PET scan- last 2017  . Myasthenia gravis ([place])   . Myasthenia gravis ([place]) 2000  . Pneumonia 2015   hosp. [facility] , treated with IV antibiotics   . Sleep apnea    sleep irregular pattern- no CPAP order solidified yet.     Past Surgical History:  Procedure Laterality Date  . APPENDECTOMY    . BACK SURGERY    . BREAST SURGERY Bilateral 2004   reduction   . EYE SURGERY Bilateral 1999   corneal peel  . MELANOMA EXCISION Left    removed from left leg  . OOPHORECTOMY Left   . THYMECTOMY  2001  . TOTAL KNEE ARTHROPLASTY Left 01/2016  . WRIST SURGERY Left    plate & 6 screws     There were no vitals filed for this visit.  Subjective Assessment - 09/04/17 0934    Subjective  I got an appointment on the 27th of July with Dr. Roney Marion.  Got one pair of leggings.      Pertinent History  L TKA 01/2016,  03/01/17 rods and screw at L4, L5, L6, history of metastatic malignant melanoma stage IIIb of the left posterior thigh, one positive lymph node diagnosed in September 2015 with excision. Maximum tumor thickness was 1.84 mm, excision was 2.5 cm deep. Surgery on Sept 2015- 16 lymph nodes biopsied, She did not have chemotherapy as she felt at that time the risk outweighed the benefit, She has also had melanomas of the right calf and left upper arm removed by Mohs procedure.     Patient Stated Goals  to get the swelling under control    Pain Score  5     Pain Location  Back    Pain Orientation  Left    Pain Descriptors / Indicators  Aching                  Outpatient Re[facility] from [facility]/2017 [facility]  Lymphedema Life Impact Scale Total Score  48.53 %           [facility] Adult PT Treatment/Exercise - 09/04/17 0001      Manual Therapy   Manual Lymphatic Drainage (MLD)  short neck, superfical and  deep abdominals, left axillary nodes, establishment of inguino axillary pathway, LLE working proximal to distal then retracing all steps spending extra time at left thigh                  PT Long Term Goals - 08/04/17 0857      PT LONG TERM GOAL #1   Title  Pt will receive appropriate garments that she can use to manage her lymphedema independently and ones that will not aggrevate her scar.    Time  4    Period  Weeks    Status  On-going      PT LONG TERM GOAL #2   Title  Pt will be able to independently apply compression bandages to LLE for long term management of lymphedema    Time  4    Period  Weeks    Status  On-going      PT LONG TERM GOAL #3   Title  Pt will demonstrate a 5 cm reduction in circumference of LLE 10 cm proximal to top of patella to decrease risk of infection    Baseline  68.8    Time  4    Period  Weeks    Status  On-going            Plan - 09/04/17 1015    Clinical Impression Statement  Pt tolerated all  well today with softening of thigh and calf with MLD work.  Got compression tights and should pick up garments this week.      Clinical Impairments Affecting Rehab Potential  large scar impeding lymph flow    PT Frequency  1x / week    PT Duration  4 weeks    PT Treatment/Interventions  ADLs/Self Care Home Management;Taping;Vasopneumatic Device;Compression bandaging;Scar mobilization;Passive range of motion;Manual lymph drainage;Manual techniques;Orthotic Fit/Training;Therapeutic exercise;Therapeutic activities    PT Next Visit Plan  continue MLD to LLE, include some Lt trunk/abdominal decongestion to see if that helps, see if compression garmnets arrived, give lymphedema risk reduction        Patient will benefit from skilled therapeutic intervention in order to improve the following deficits and impairments:  Increased edema, Decreased knowledge of precautions, Decreased strength, Decreased scar mobility, Pain  Visit Diagnosis: Lymphedema, not elsewhere classified  Difficulty in walking, not elsewhere classified  Pain in left leg     Problem List Patient Active Problem List   Diagnosis Date Noted  . IDA (iron deficiency anemia) 06/21/2017  . Spondylolisthesis at L3-L4 level 03/01/2017  . Allergy to environmental factors 09/15/2016  . Chronic low back pain 09/15/2016  . Degenerative spondylolisthesis 09/15/2016  . Depression 09/15/2016  . Leg edema 09/15/2016  . Lumbosacral spondylosis without myelopathy 09/15/2016  . Osteoarthritis of knees, bilateral 09/15/2016  . Pernicious anemia 09/15/2016  . 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 Malignant melanoma of thigh, left ([place]) Stage III 11/05/2013  . CERVICAL RADICULOPATHY, LEFT 06/04/2008  . MEDIAL EPICONDYLITIS 06/04/2008  . ANEMIA-NOS 03/25/2008  . ALLERGIC RHINITIS 03/25/2008  . Asthma 03/25/2008  . FATIGUE 03/25/2008  . B12 DEFICIENCY 06/04/2007  . RESTLESS LEG SYNDROME 06/04/2007  .  MYASTHENIA GRAVIS WITHOUT EXACERBATION 06/04/2007  . GERD 06/04/2007  . IRRITABLE BOWEL SYNDROME 06/04/2007    Shan Levans, PT 09/04/2017, 10:17 AM  [place], [place], [address] Phone: [phone]   Fax:  [phone]  Name: [name] MRN: [id number] Date of Birth: 1960/10/31

## 2017-09-06 ENCOUNTER — Ambulatory Visit: Payer: BLUE CROSS/BLUE SHIELD

## 2017-09-06 DIAGNOSIS — R262 Difficulty in walking, not elsewhere classified: Secondary | ICD-10-CM

## 2017-09-06 DIAGNOSIS — M79605 Pain in left leg: Secondary | ICD-10-CM

## 2017-09-06 DIAGNOSIS — I890 Lymphedema, not elsewhere classified: Secondary | ICD-10-CM

## 2017-09-06 NOTE — Therapy (Signed)
[place], [place], [address] Phone: [phone]   Fax:  [phone]  Physical Therapy Treatment  Patient Details  Name: [name] MRN: [id number] Date of Birth: [birth year] Referring Provider: Princella Pellegrini   Encounter Date: 09/06/2017  PT End of Session - 09/06/17 1016    Visit Number  7    Number of Visits  21    Date for PT Re-Evaluation  10/04/17    PT Start Time  0942 Pt arrived late    PT Stop Time  1016    PT Time Calculation (min)  34 min    Activity Tolerance  Patient tolerated treatment well    Behavior During Therapy  [facility] for tasks assessed/performed       Past Medical History:  Diagnosis Date  . Anemia   . Arthritis    back & knees   . Asthma    uses albuterol inhaler if outside, cold weather & exercising   . GERD (gastroesophageal reflux disease)   . History of hiatal hernia   . History of kidney stones    passed spontaneously  . Melanoma ([place])    malignant, left leg,  wide excision, sentinel node, one positive stage 3, as a result- lymphdema, will be following with ONCOLOGY, PET scan- last 2017  . Myasthenia gravis ([place])   . Myasthenia gravis ([place]) 2000  . Pneumonia 2015   hosp. [facility] , treated with IV antibiotics   . Sleep apnea    sleep irregular pattern- no CPAP order solidified yet.     Past Surgical History:  Procedure Laterality Date  . APPENDECTOMY    . BACK SURGERY    . BREAST SURGERY Bilateral 2004   reduction   . EYE SURGERY Bilateral 1999   corneal peel  . MELANOMA EXCISION Left    removed from left leg  . OOPHORECTOMY Left   . THYMECTOMY  2001  . TOTAL KNEE ARTHROPLASTY Left 01/2016  . WRIST SURGERY Left    plate & 6 screws     There were no vitals filed for this visit.  Subjective Assessment - 09/06/17 0946    Subjective  I ordered compression sleeve and bil legs. The sleeve arrived yesterday at the store but the thigh high hasn't  come in yet.     Pertinent History  L TKA 01/2016, 03/01/17 rods and screw at L4, L5, L6, history of metastatic malignant melanoma stage IIIb of the left posterior thigh, one positive lymph node diagnosed in September 2015 with excision. Maximum tumor thickness was 1.84 mm, excision was 2.5 cm deep. Surgery on Sept 2015- 16 lymph nodes biopsied, She did not have chemotherapy as she felt at that time the risk outweighed the benefit, She has also had melanomas of the right calf and left upper arm removed by Mohs procedure.     Patient Stated Goals  to get the swelling under control    Currently in Pain?  Yes    Pain Score  5     Pain Location  Back    Pain Orientation  Left    Pain Descriptors / Indicators  Aching    Pain Type  Chronic pain    Pain Onset  More than a month ago past 5 years    Pain Frequency  Constant    Aggravating Factors   difficult to walk and makes Lt TKA feel unstable    Pain Relieving Factors  elevating and compression pump  Outpatient [facility]  Lymphedema Life Impact Scale Total Score  48.53 %           [facility] Adult PT Treatment/Exercise - 09/06/17 0001      Manual Therapy   Manual Lymphatic Drainage (MLD)  short neck, superfical and deep abdominals, left axillary and Rt inguinal nodes, establishment of inguino axillary  and anterior inter-inguinal pathways, LLE working proximal to distal then retracing all steps spending extra time at left thigh                  PT Long Term Goals - 09/06/17 1226      PT LONG TERM GOAL #1   Title  Pt will receive appropriate garments that she can use to manage her lymphedema independently and ones that will not aggrevate her scar.    Baseline  Her arm sleeve arrived yesterday and her thigh high is due to arrive soon-09/06/17    Status  Partially Met      PT LONG TERM GOAL #2   Title  Pt will be able to independently apply  compression bandages to LLE for long term management of lymphedema    Status  Deferred      PT LONG TERM GOAL #3   Title  Pt will demonstrate a 5 cm reduction in circumference of LLE 10 cm proximal to top of patella to decrease risk of infection    Status  On-going            Plan - 09/06/17 1017    Clinical Impression Statement  Just continued with manual lymph drainage to Lt LE adding the inter-inguinal anastomosis as trial. Pt to let us know how that did with Rt LE swelling. Her compression sleeve arrived late yesterday so will pick it up soon and thigh high should arrive soon as well.     Rehab Potential  Good    Clinical Impairments Affecting Rehab Potential  large scar impeding lymph flow    PT Frequency  1x / week    PT Duration  4 weeks    PT Treatment/Interventions  ADLs/Self Care Home Management;Taping;Vasopneumatic Device;Compression bandaging;Scar mobilization;Passive range of motion;Manual lymph drainage;Manual techniques;Orthotic Fit/Training;Therapeutic exercise;Therapeutic activities    PT Next Visit Plan  continue MLD to LLE, see how using inter-inguinal anastomosis was; include some Lt trunk/abdominal decongestion to see if that helps, see if compression garmnets arrived, give lymphedema risk reduction     Consulted and Agree with Plan of Care  Patient       Patient will benefit from skilled therapeutic intervention in order to improve the following deficits and impairments:  Increased edema, Decreased knowledge of precautions, Decreased strength, Decreased scar mobility, Pain  Visit Diagnosis: Lymphedema, not elsewhere classified  Difficulty in walking, not elsewhere classified  Pain in left leg     Problem List Patient Active Problem List   Diagnosis Date Noted  . IDA (iron deficiency anemia) 06/21/2017  . Spondylolisthesis at L3-L4 level 03/01/2017  . Allergy to environmental factors 09/15/2016  . Chronic low back pain 09/15/2016  . Degenerative  spondylolisthesis 09/15/2016  . Depression 09/15/2016  . Leg edema 09/15/2016  . Lumbosacral spondylosis without myelopathy 09/15/2016  . Osteoarthritis of knees, bilateral 09/15/2016  . Pernicious anemia 09/15/2016  . 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 Malignant melanoma of thigh, left ([place]) Stage III 11/05/2013  . CERVICAL RADICULOPATHY, LEFT 06/04/2008  . MEDIAL EPICONDYLITIS 06/04/2008  . ANEMIA-NOS 03/25/2008  .  ALLERGIC RHINITIS 03/25/2008  . Asthma 03/25/2008  . FATIGUE 03/25/2008  . B12 DEFICIENCY 06/04/2007  . RESTLESS LEG SYNDROME 06/04/2007  . MYASTHENIA GRAVIS WITHOUT EXACERBATION 06/04/2007  . GERD 06/04/2007  . IRRITABLE BOWEL SYNDROME 06/04/2007    Otelia Limes, PTA 09/06/2017, 12:31 PM  [place], [place], [address] Phone: [phone]   Fax:  [phone]  Name: [name] MRN: [id number] Date of Birth: [birth year]

## 2017-09-18 ENCOUNTER — Ambulatory Visit: Payer: BLUE CROSS/BLUE SHIELD | Admitting: Rehabilitation

## 2017-09-20 ENCOUNTER — Ambulatory Visit: Payer: BLUE CROSS/BLUE SHIELD

## 2017-09-20 DIAGNOSIS — R262 Difficulty in walking, not elsewhere classified: Secondary | ICD-10-CM

## 2017-09-20 DIAGNOSIS — I890 Lymphedema, not elsewhere classified: Secondary | ICD-10-CM

## 2017-09-20 DIAGNOSIS — M79605 Pain in left leg: Secondary | ICD-10-CM

## 2017-09-20 NOTE — Therapy (Addendum)
[place], [place], [address] Phone: [phone]   Fax:  [phone]  Physical Therapy Treatment  Patient Details  Name: [name] MRN: [id number] Date of Birth: 1960-06-13 Referring Provider: Princella Pellegrini   Encounter Date: 09/20/2017  PT End of Session - 09/20/17 1020    Visit Number  8    Number of Visits  21    Date for PT Re-Evaluation  10/04/17    PT Start Time  0937    PT Stop Time  1020    PT Time Calculation (min)  43 min    Activity Tolerance  Patient tolerated treatment well    Behavior During Therapy  [facility] for tasks assessed/performed       Past Medical History:  Diagnosis Date  . Anemia   . Arthritis    back & knees   . Asthma    uses albuterol inhaler if outside, cold weather & exercising   . GERD (gastroesophageal reflux disease)   . History of hiatal hernia   . History of kidney stones    passed spontaneously  . Melanoma ([place])    malignant, left leg,  wide excision, sentinel node, one positive stage 3, as a result- lymphdema, will be following with ONCOLOGY, PET scan- last 2017  . Myasthenia gravis ([place])   . Myasthenia gravis ([place]) 2000  . Pneumonia 2015   hosp. [facility] , treated with IV antibiotics   . Sleep apnea    sleep irregular pattern- no CPAP order solidified yet.     Past Surgical History:  Procedure Laterality Date  . APPENDECTOMY    . BACK SURGERY    . BREAST SURGERY Bilateral 2004   reduction   . EYE SURGERY Bilateral 1999   corneal peel  . MELANOMA EXCISION Left    removed from left leg  . OOPHORECTOMY Left   . THYMECTOMY  2001  . TOTAL KNEE ARTHROPLASTY Left 01/2016  . WRIST SURGERY Left    plate & 6 screws     There were no vitals filed for this visit.  Subjective Assessment - 09/20/17 0939    Subjective  I haven't heard about my compression thigh highs so I'm going over there after my appt to see what's going on. My arm  compression sleeve fits great though.    Pertinent History  L TKA 01/2016, 03/01/17 rods and screw at L4, L5, L6, history of metastatic malignant melanoma stage IIIb of the left posterior thigh, one positive lymph node diagnosed in September 2015 with excision. Maximum tumor thickness was 1.84 mm, excision was 2.5 cm deep. Surgery on Sept 2015- 16 lymph nodes biopsied, She did not have chemotherapy as she felt at that time the risk outweighed the benefit, She has also had melanomas of the right calf and left upper arm removed by Mohs procedure.     Patient Stated Goals  to get the swelling under control    Currently in Pain?  Yes    Pain Score  6     Pain Location  Back    Pain Orientation  Right    Pain Descriptors / Indicators  Aching;Dull;Nagging    Pain Type  Acute pain    Pain Onset  In the past 7 days    Aggravating Factors   not sure, this pain is different than what I've been having    Pain Relieving Factors  can't find anything yet to make this feel better  LYMPHEDEMA/ONCOLOGY QUESTIONNAIRE - 09/20/17 0942      Left Lower Extremity Lymphedema   30 cm Proximal to Suprapatella  78.4 cm    20 cm Proximal to Suprapatella  78.1 cm    10 cm Proximal to Suprapatella  68.6 cm    At Midpatella/Popliteal Crease  46 cm    30 cm Proximal to Floor at Lateral Plantar Foot  47.4 cm    20 cm Proximal to Floor at Lateral Plantar Foot  38.6 cm    10 cm Proximal to Floor at Lateral Malleoli  27.9 cm    5 cm Proximal to 1st MTP Joint  23.5 cm    Across MTP Joint  22.3 cm    Around Proximal Great Toe  8.3 cm           Outpatient Re[facility] from [facility]  Lymphedema Life Impact Scale Total Score  48.53 %           [facility] Adult PT Treatment/Exercise - 09/20/17 0001      Manual Therapy   Manual therapy comments  Applied lotion to pts Lt LE at end of session due to her skin is becoming drier recently per pt report    Manual  Lymphatic Drainage (MLD)  short neck, superfical and deep abdominals, left axillary and Rt inguinal nodes, establishment of inguino axillary  and anterior inter-inguinal pathways, LLE working proximal to distal then retracing all steps spending extra time at left thigh                  PT Long Term Goals - 09/06/17 1226      PT LONG TERM GOAL #1   Title  Pt will receive appropriate garments that she can use to manage her lymphedema independently and ones that will not aggrevate her scar.    Baseline  Her arm sleeve arrived yesterday and her thigh high is due to arrive soon-09/06/17    Status  Partially Met      PT LONG TERM GOAL #2   Title  Pt will be able to independently apply compression bandages to LLE for long term management of lymphedema    Status  Deferred      PT LONG TERM GOAL #3   Title  Pt will demonstrate a 5 cm reduction in circumference of LLE 10 cm proximal to top of patella to decrease risk of infection    Status  On-going            Plan - 09/20/17 1021    Clinical Impression Statement  Pts circumference measurements were slightly increased from last time measured. She reports doing self MLD daily and is wearing compression pantyhose some but is frustrated with wear of these as ist is so hot and she has to wear other clothes over them. She was heading to [facility] after appt today to see if her custom flat knit is in.     Rehab Potential  Good    Clinical Impairments Affecting Rehab Potential  large scar impeding lymph flow    PT Frequency  1x / week    PT Duration  4 weeks    PT Treatment/Interventions  ADLs/Self Care Home Management;Taping;Vasopneumatic Device;Compression bandaging;Scar mobilization;Passive range of motion;Manual lymph drainage;Manual techniques;Orthotic Fit/Training;Therapeutic exercise;Therapeutic activities    PT Next Visit Plan  continue MLD to LLE, see if garments have arrived; include some Lt trunk/abdominal decongestion to  see if that helps, give lymphedema risk reduction       Recommended Other Services  Per pt request sent evaluation and todays note to Dr. Roney Marion for her upcoming appt with them on 09/25/17.    Consulted and Agree with Plan of Care  Patient       Patient will benefit from skilled therapeutic intervention in order to improve the following deficits and impairments:  Increased edema, Decreased knowledge of precautions, Decreased strength, Decreased scar mobility, Pain  Visit Diagnosis: Lymphedema, not elsewhere classified  Difficulty in walking, not elsewhere classified  Pain in left leg     Problem List Patient Active Problem List   Diagnosis Date Noted  . IDA (iron deficiency anemia) 06/21/2017  . Spondylolisthesis at L3-L4 level 03/01/2017  . Allergy to environmental factors 09/15/2016  . Chronic low back pain 09/15/2016  . Degenerative spondylolisthesis 09/15/2016  . Depression 09/15/2016  . Leg edema 09/15/2016  . Lumbosacral spondylosis without myelopathy 09/15/2016  . Osteoarthritis of knees, bilateral 09/15/2016  . Pernicious anemia 09/15/2016  . 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 Malignant melanoma of thigh, left ([place]) Stage III 11/05/2013  . CERVICAL RADICULOPATHY, LEFT 06/04/2008  . MEDIAL EPICONDYLITIS 06/04/2008  . ANEMIA-NOS 03/25/2008  . ALLERGIC RHINITIS 03/25/2008  . Asthma 03/25/2008  . FATIGUE 03/25/2008  . B12 DEFICIENCY 06/04/2007  . RESTLESS LEG SYNDROME 06/04/2007  . MYASTHENIA GRAVIS WITHOUT EXACERBATION 06/04/2007  . GERD 06/04/2007  . IRRITABLE BOWEL SYNDROME 06/04/2007    Otelia Limes, PTA 09/20/2017, 2:23 PM  [place], [place], [address] Phone: [phone]   Fax:  [phone]  Name: Lirio Bach MRN: [id number] Date of Birth: 1960/08/11  PHYSICAL THERAPY DISCHARGE SUMMARY  Visits from Start of Care:  8  Current functional level related to goals / functional outcomes: See above. Per recent phone call with patient she followed up with Dr. Roney Marion about her swelling and she was sent for additional blood work. She has not heard anything back. She is having increased fibrosis and plans on coming back to see me with a new order.   Remaining deficits: See above   Education / Equipment: Self MLD, compression garments and pump Plan: Patient agrees to discharge.  Patient goals were not met. Patient is being discharged due to not returning since the last visit.  ?????    Allyson Sabal [place], [place] 03/08/18 11:56 AM

## 2017-09-26 ENCOUNTER — Other Ambulatory Visit: Payer: Self-pay | Admitting: Family Medicine

## 2017-09-26 ENCOUNTER — Other Ambulatory Visit: Payer: Self-pay | Admitting: Family

## 2017-09-26 DIAGNOSIS — G2581 Restless legs syndrome: Secondary | ICD-10-CM

## 2017-09-26 DIAGNOSIS — R52 Pain, unspecified: Secondary | ICD-10-CM

## 2017-09-26 NOTE — Telephone Encounter (Signed)
Requesting:[place] Contract:yes UDS:07/12/17 Last OV:08/23/17 Next OV:not scheduled  Last Refill:08/18/17  #[id number] Database:   Please advise

## 2017-09-26 NOTE — Telephone Encounter (Signed)
Hydrocodone refill Last Refill:08/18/17 #60 Last OV: 07/12/17 PCP: Dr. Lorelei Pont Pharmacy: [facility]

## 2017-09-26 NOTE — Telephone Encounter (Signed)
Copied from [place] [id number]. Topic: Quick Communication - Rx Refill/Question >> Sep 26, 2017 12:24 PM [name] B wrote: Medication: HYDROcodone-acetaminophen (NORCO/VICODIN) 5-325 MG tablet [[id number]]   Has the patient contacted their pharmacy? Yes.   (Agent: If no, request that the patient contact the pharmacy for the refill.) (Agent: If yes, when and what did the pharmacy advise?)  [facility]  Agent: Please be advised that RX refills may take up to 3 business days. We ask that you follow-up with your pharmacy.

## 2017-09-27 ENCOUNTER — Telehealth: Payer: Self-pay | Admitting: Family Medicine

## 2017-09-27 ENCOUNTER — Other Ambulatory Visit: Payer: Self-pay | Admitting: Family

## 2017-09-27 MED ORDER — HYDROCODONE-ACETAMINOPHEN 5-325 MG PO TABS: 1.0000 | ORAL_TABLET | Freq: Four times a day (QID) | ORAL | 0 refills | Status: DC | PRN

## 2017-09-27 NOTE — Telephone Encounter (Signed)
I received a call from Dr. 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  [place], [place], [place] [address] [phone]  They are concerned that she may have a metabolic cause of her lymphedema We would like to check her labs- liver and kidney, autoimmune markers.  Will ask Mel Almond to call her and get her scheduled to see me soon

## 2017-09-27 NOTE — Telephone Encounter (Signed)
Seen in May [facility]:  08/20/2017  4  08/18/2017  Hydrocodone-Acetamin 5-325 Mg  60.00 15 Je Cop  [id number]  [place] ([id number])  1/1 20.00 MME Comm Ins  [place]  07/12/2017  4  07/12/2017  Hydrocodone-Acetamin 5-325 Mg  60.00 15 Je Cop  [id number]  [place] ([id number])  1/1 20.00 MME Comm Ins  [place]  05/22/2017  4  05/22/2017  Hydrocodone-Chlorphen Er Susp  50.00 5 Lynford Citizen  [id number]  [place] ([id number])  1/1 20.00 MME Comm Ins  [place]  05/17/2017  4  05/17/2017  Hydrocodone-Acetamin 5-325 Mg  30.00 5 Pa Wri  [id number]  [place] ([id number])  1/1 30.00 MME Comm Ins  [place]  04/28/2017  4  04/28/2017  Hydrocodone-Acetamin 5-325 Mg  30.00 7 Ki Mey  [id number]  [place] ([id number])  1/1 21.43 MME Comm Ins  [place]  04/07/2017  3  11/21/2016  Zolpidem Tart Er 12.5 Mg Tab  30.00 30 Jiles Garter  [id number]  [place] [phone])  2/5      UDS is UTD Ok to refill

## 2017-10-03 NOTE — Telephone Encounter (Signed)
Left message to return call. [place] for [employer] to discuss and schedule.

## 2017-10-03 NOTE — Telephone Encounter (Signed)
Pt has been scheduled for pcp's next available openings. (Monday at 3:15) if provider wishes to see pt sooner please assist with sooner scheduling.   Thanks.   CB: [phone]

## 2017-10-05 NOTE — Progress Notes (Addendum)
[facility] [address], [place], [place] [address] [phone]  Date:  10/09/2017   Name:  [name]   DOB:  Aug 31, 1960   MRN:  [id number]  PCP:  Darreld Mclean, MD    Chief Complaint: Lymphedema (needing lab work)   History of Present Illness:  [name] is a 57 y.o. very pleasant female patient who presents with the following: History of left leg lymphedema following a lymph node dissection for melanoma in 2015.  She has been seeing a lymphedema specialist in [facility], Dr. Ruben Reason.  She called me and also sent her office note- now suspect that her edema is NOT just due to her melanoma and node dissection.  Her swelling is becoming more generalized and we wonder if this may be autoimmune or inflammatory in nature, or could be due to liver or kidney dysfunction.  We will work on this today.  She also recommended that Mardene Celeste see rheumatology again to discuss her past dx of myasthenia gravis   I met her in May of this year: Here today as a new patient- she is originally from [place] but moved back to this area from [place] about 6 months ago due to family being here and job change Her children live here- her son is a Agricultural consultant and daughter is in sports med.  Her current (second) husband has 4 grands that they share Prior to her dx of melanoma in 2015 she was generally in good health.  She had surgery on her left leg for melanoma in 2015 with lymph node dissection which resulted in lymphedema Besides her left leg lymphedema she also has some milder swelling in her right leg   The pt feels like the lymphedema is present not only in her left leg, but also in her left leg and her left face/ neck She has noted the edema involving more than just her left leg for about 8 months  She is gaining weight- we feel that some of this is fluid  She is getting frustrated and feels defeated- she is not sure how she is  going to function with this edema Exercise is becoming harder to do She has pain on her left side when she walks She does have back pain which is stable but her current pain is different   She does admit to some feelings of depression for the last 6 months  She feels embarrassed about her edema  She feels overwhelmed. Low energy Denies any SI She would like to try some medication for depression at this time No history of seizure  She was originally dx with myasthenia gravis dx in 1999. She reports that her blood was normal but her EEGs showed MG.  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 She is thinking about applying for disability and I think she would likely qualify  Patient Active Problem List   Diagnosis Date Noted  . IDA (iron deficiency anemia) 06/21/2017  . Spondylolisthesis at L3-L4 level 03/01/2017  . Allergy to environmental factors 09/15/2016  . Chronic low back pain  09/15/2016  . Degenerative spondylolisthesis 09/15/2016  . Depression 09/15/2016  . Leg edema 09/15/2016  . Lumbosacral spondylosis without myelopathy 09/15/2016  . Osteoarthritis of knees, bilateral 09/15/2016  . Pernicious anemia 09/15/2016  . 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 Malignant melanoma of thigh, left ([place]) Stage III 11/05/2013  . CERVICAL RADICULOPATHY, LEFT 06/04/2008  . MEDIAL EPICONDYLITIS 06/04/2008  . ANEMIA-NOS 03/25/2008  . ALLERGIC RHINITIS 03/25/2008  . Asthma 03/25/2008  . FATIGUE 03/25/2008  . B12 DEFICIENCY 06/04/2007  . RESTLESS LEG SYNDROME 06/04/2007  . MYASTHENIA GRAVIS WITHOUT EXACERBATION 06/04/2007  . GERD 06/04/2007  . IRRITABLE BOWEL SYNDROME  06/04/2007    Past Medical History:  Diagnosis Date  . Anemia   . Arthritis    back & knees   . Asthma    uses albuterol inhaler if outside, cold weather & exercising   . GERD (gastroesophageal reflux disease)   . History of hiatal hernia   . History of kidney stones    passed spontaneously  . Melanoma ([place])    malignant, left leg,  wide excision, sentinel node, one positive stage 3, as a result- lymphdema, will be following with ONCOLOGY, PET scan- last 2017  . Myasthenia gravis ([place])   . Myasthenia gravis ([place]) 2000  . Pneumonia 2015   hosp. [facility] , treated with IV antibiotics   . Sleep apnea    sleep irregular pattern- no CPAP order solidified yet.     Past Surgical History:  Procedure Laterality Date  . APPENDECTOMY    . BACK SURGERY    . BREAST SURGERY Bilateral 2004   reduction   . EYE SURGERY Bilateral 1999   corneal peel  . MELANOMA EXCISION Left    removed from left leg  . OOPHORECTOMY Left   . THYMECTOMY  2001  . TOTAL KNEE ARTHROPLASTY Left 01/2016  . WRIST SURGERY Left    plate & 6 screws     Social History   Tobacco Use  . Smoking status: Never Smoker  . Smokeless tobacco: Never Used  Substance Use Topics  . Alcohol use: Yes    Alcohol/week: 1.0 standard drinks    Types: 1 Standard drinks or equivalent per week    Comment: occassionally  . Drug use: No    Family History  Problem Relation Age of Onset  . COPD Mother   . Lung cancer Father   . Diverticulitis Father   . Colitis Neg Hx     Allergies  Allergen Reactions  . Other Other (See Comments)    Cant take some "mycin" drugs   . Tape Other (See Comments)    Blister, irritates skin    Medication list has been reviewed and updated.  Current Outpatient Medications on File Prior to Visit  Medication Sig Dispense Refill  . albuterol (PROVENTIL HFA;VENTOLIN HFA) 108 (90 Base) MCG/ACT inhaler Inhale 1 puff into the lungs every 6 (six) hours as needed for wheezing or shortness of  breath. 1 Inhaler 6  . hydrochlorothiazide (MICROZIDE) 12.5 MG capsule Take 1 capsule (12.5 mg total) by mouth daily. Use as needed for swelling 30 capsule 6  . HYDROcodone-acetaminophen (NORCO/VICODIN) 5-325 MG tablet Take 1 tablet by mouth every 6 hours as needed for moderate pain.  Ok to fill in 30 days 60 tablet 0  . HYDROcodone-acetaminophen (NORCO/VICODIN) 5-325 MG tablet Take 1 tablet by mouth every 6 (six) hours as needed for moderate pain. 60 tablet 0  . hyoscyamine (LEVSIN SL) 0.125  MG SL tablet PLACE 1 TABLET UNDER THE TONGUE EVERY 4 HOURS AS NEEDED FOR CRAMPING. 180 tablet 0  . ipratropium (ATROVENT) 0.03 % nasal spray Place 2 sprays into the nose 4 (four) times daily. 30 mL 6  . pantoprazole (PROTONIX) 40 MG tablet Take twice daily for 3 months, then daily 60 tablet 11  . pramipexole (MIRAPEX) 1.5 MG tablet TAKE 2 TABLETS (3 MG TOTAL) BY MOUTH 2 (TWO) TIMES DAILY AS NEEDED (FOR RESTLESS LEG SYNDROME 344 tablet 1  . tiZANidine (ZANAFLEX) 4 MG tablet Take 1 tablet (4 mg total) by mouth every 6 (six) hours as needed for muscle spasms (if unresponsive to cyclobenzaprine). (Patient taking differently: Take 4-6 mg by mouth every 6 (six) hours as needed for muscle spasms (if unresponsive to cyclobenzaprine). ) 30 tablet 0  . triamcinolone cream (KENALOG) 0.1 % Apply 1 application topically 2 (two) times daily. 45 g 0   No current facility-administered medications on file prior to visit.     Review of Systems:  As per HPI- otherwise negative. Mild tachycardia is typical for her  No fever or chills No CP or SOB   Physical Examination: Vitals:   10/09/17 1523  BP: 138/80  Pulse: (!) 110  Resp: 17  Temp: 97.9 F (36.6 C)  SpO2: 98%   Vitals:   10/09/17 1523  Weight: 265 lb (120.2 kg)  Height: '5\' 7"'  (1.702 m)   Body mass index is 41.5 kg/m. Ideal Body Weight: Weight in (lb) to have BMI = 25: 159.3  GEN: WDWN, NAD, Non-toxic, A & O x 3, obese, with visible edema of her left  side from leg to face  HEENT: Atraumatic, Normocephalic. Neck supple. No masses, No LAD.  Bilateral TM wnl, oropharynx normal.  PEERL,EOMI.   Ears and Nose: No external deformity. CV: RRR, No M/G/R. No JVD. No thrill. No extra heart sounds. PULM: CTA B, no wheezes, crackles, rhonchi. No retractions. No resp. distress. No accessory muscle use. ABD: S, NT, ND, +BS. No rebound. No HSM. EXTR: No c/c/e NEURO lymphedema, much more on the left side PSYCH: Normally interactive. Conversant. Not depressed or anxious appearing.  Calm demeanor.    Assessment and Plan: Lymphedema - Plan: CBC, Comprehensive metabolic panel, Sedimentation rate, C-reactive protein  Myasthenia gravis without acute exacerbation ([place]) - Plan: Ambulatory referral to Rheumatology  Arthralgia, unspecified joint - Plan: Rheumatoid Factor, ANA, Cyclic citrul peptide antibody, IgG  Low energy - Plan: TSH  Restless legs - Plan: Ferritin  Encounter for hepatitis C screening test for low risk patient - Plan: Hepatitis C antibody  Adjustment disorder with depressed mood - Plan: buPROPion (WELLBUTRIN XL) 150 MG 24 hr tablet  Orthopnea - Plan: B Nat Peptide  Following up today We are looking for any other cause of her lymphedema as above We will also start her on wellbutrin Xl 150 daily - she will let me know if not doing ok, otherwise may increase her dose at our next visit Referral back to rheumatology as well  Will plan further follow- up pending labs.  Signed Lamar Blinks, MD  8/15 Received her labs.  Also notice from rheumatology that neurology would be the more appropriate referral, which I changed  Results for orders placed or performed in visit on 10/09/17  CBC  Result Value Ref Range   WBC 8.0 4.0 - 10.5 K/uL   RBC 4.16 3.87 - 5.11 Mil/uL   Platelets 277.0 150.0 - 400.0 K/uL   Hemoglobin 12.5 12.0 -  15.0 g/dL   HCT 36.3 36.0 - 46.0 %   MCV 87.3 78.0 - 100.0 fl   MCHC 34.5 30.0 - 36.0 g/dL   RDW 15.0  11.5 - 15.5 %  Comprehensive metabolic panel  Result Value Ref Range   Sodium 140 135 - 145 mEq/L   Potassium 3.7 3.5 - 5.1 mEq/L   Chloride 103 96 - 112 mEq/L   CO2 30 19 - 32 mEq/L   Glucose, Bld 139 (H) 70 - 99 mg/dL   BUN 16 6 - 23 mg/dL   Creatinine, Ser 0.74 0.40 - 1.20 mg/dL   Total Bilirubin 0.3 0.2 - 1.2 mg/dL   Alkaline Phosphatase 111 39 - 117 U/L   AST 17 0 - 37 U/L   ALT 16 0 - 35 U/L   Total Protein 6.9 6.0 - 8.3 g/dL   Albumin 4.2 3.5 - 5.2 g/dL   Calcium 9.4 8.4 - 10.5 mg/dL   GFR 85.95 >60.00 mL/min  Sedimentation rate  Result Value Ref Range   Sed Rate 20 0 - 30 mm/hr  C-reactive protein  Result Value Ref Range   CRP 0.8 0.5 - 20.0 mg/dL  Rheumatoid Factor  Result Value Ref Range   Rhuematoid fact SerPl-aCnc <14 <14 IU/mL  ANA  Result Value Ref Range   Anti Nuclear Antibody(ANA) NEGATIVE NEGATIVE  Cyclic citrul peptide antibody, IgG  Result Value Ref Range   Cyclic Citrullin Peptide Ab <16 UNITS  TSH  Result Value Ref Range   TSH 1.62 0.35 - 4.50 uIU/mL  Ferritin  Result Value Ref Range   Ferritin 40.5 10.0 - 291.0 ng/mL  Hepatitis C antibody  Result Value Ref Range   Hepatitis C Ab NON-REACTIVE NON-REACTI   SIGNAL TO CUT-OFF 0.02 <1.00  B Nat Peptide  Result Value Ref Range   Pro B Natriuretic peptide (BNP) 23.0 0.0 - 100.0 pg/mL    Blood counts normal Metabolic profile normal Sed rate and CRP, rheumatoid factor, ANA and CCP all normal- no suggestion of an auto-immune disorder Thyroid normal Hep C screening negative BNP does not show any suggestion of heart failure  The good news is that your labs are all normal- however this does not help [name] in determining any other cause of your edema.  When do you see your edema doc in [facility] again?  Would you please be sure to take a copy of your labs to show her?

## 2017-10-08 ENCOUNTER — Other Ambulatory Visit: Payer: Self-pay | Admitting: Family Medicine

## 2017-10-08 DIAGNOSIS — R0982 Postnasal drip: Secondary | ICD-10-CM

## 2017-10-09 ENCOUNTER — Encounter: Payer: Self-pay | Admitting: Family Medicine

## 2017-10-09 ENCOUNTER — Ambulatory Visit (INDEPENDENT_AMBULATORY_CARE_PROVIDER_SITE_OTHER): Payer: BLUE CROSS/BLUE SHIELD | Admitting: Family Medicine

## 2017-10-09 VITALS — BP 138/80 | HR 110 | Temp 97.9°F | Resp 17 | Ht 67.0 in | Wt 265.0 lb

## 2017-10-09 DIAGNOSIS — R5383 Other fatigue: Secondary | ICD-10-CM

## 2017-10-09 DIAGNOSIS — M2550 Pain in unspecified joint: Secondary | ICD-10-CM

## 2017-10-09 DIAGNOSIS — Z1159 Encounter for screening for other viral diseases: Secondary | ICD-10-CM

## 2017-10-09 DIAGNOSIS — I890 Lymphedema, not elsewhere classified: Secondary | ICD-10-CM | POA: Diagnosis not present

## 2017-10-09 DIAGNOSIS — F4321 Adjustment disorder with depressed mood: Secondary | ICD-10-CM

## 2017-10-09 DIAGNOSIS — G7000 Myasthenia gravis without (acute) exacerbation: Secondary | ICD-10-CM | POA: Diagnosis not present

## 2017-10-09 DIAGNOSIS — G2581 Restless legs syndrome: Secondary | ICD-10-CM

## 2017-10-09 DIAGNOSIS — R0601 Orthopnea: Secondary | ICD-10-CM | POA: Diagnosis not present

## 2017-10-09 MED ORDER — BUPROPION HCL ER (XL) 150 MG PO TB24: 150.0000 mg | ORAL_TABLET | Freq: Every day | ORAL | 6 refills | Status: DC

## 2017-10-09 NOTE — Patient Instructions (Addendum)
It was good to see you today- I will be in touch with your labs asap We are going to refer you back to rheumatology to discuss your situation Let's start you on wellbutrin-   I hope that you will be in a brighter mood in a few weeks, but please let me know if that is not the case- Sooner if worse.   Let's visit in 4-6 weeks to check on your progress

## 2017-10-10 LAB — COMPREHENSIVE METABOLIC PANEL
ALBUMIN: 4.2 g/dL (ref 3.5–5.2)
ALT: 16 U/L (ref 0–35)
AST: 17 U/L (ref 0–37)
Alkaline Phosphatase: 111 U/L (ref 39–117)
BUN: 16 mg/dL (ref 6–23)
CHLORIDE: 103 meq/L (ref 96–112)
CO2: 30 meq/L (ref 19–32)
CREATININE: 0.74 mg/dL (ref 0.40–1.20)
Calcium: 9.4 mg/dL (ref 8.4–10.5)
GFR: 85.95 mL/min (ref >60.00)
GLUCOSE: 139 mg/dL — AB (ref 70–99)
POTASSIUM: 3.7 meq/L (ref 3.5–5.1)
SODIUM: 140 meq/L (ref 135–145)
Total Bilirubin: 0.3 mg/dL (ref 0.2–1.2)
Total Protein: 6.9 g/dL (ref 6.0–8.3)

## 2017-10-10 LAB — BRAIN NATRIURETIC PEPTIDE: Pro B Natriuretic peptide (BNP): 23 pg/mL (ref 0.0–100.0)

## 2017-10-10 LAB — C-REACTIVE PROTEIN: CRP: 0.8 mg/dL (ref 0.5–20.0)

## 2017-10-10 LAB — CBC
HEMATOCRIT: 36.3 % (ref 36.0–46.0)
Hemoglobin: 12.5 g/dL (ref 12.0–15.0)
MCHC: 34.5 g/dL (ref 30.0–36.0)
MCV: 87.3 fl (ref 78.0–100.0)
Platelets: 277 10*3/uL (ref 150.0–400.0)
RBC: 4.16 Mil/uL (ref 3.87–5.11)
RDW: 15 % (ref 11.5–15.5)
WBC: 8 10*3/uL (ref 4.0–10.5)

## 2017-10-10 LAB — FERRITIN: Ferritin: 40.5 ng/mL (ref 10.0–291.0)

## 2017-10-10 LAB — TSH: TSH: 1.62 u[IU]/mL (ref 0.35–4.50)

## 2017-10-10 LAB — SEDIMENTATION RATE: Sed Rate: 20 mm/hr (ref 0–30)

## 2017-10-11 ENCOUNTER — Encounter: Payer: Self-pay | Admitting: Family Medicine

## 2017-10-11 ENCOUNTER — Other Ambulatory Visit: Payer: Self-pay | Admitting: Family Medicine

## 2017-10-11 DIAGNOSIS — G7000 Myasthenia gravis without (acute) exacerbation: Secondary | ICD-10-CM

## 2017-10-11 LAB — ANA: Anti Nuclear Antibody(ANA): NEGATIVE

## 2017-10-11 LAB — HEPATITIS C ANTIBODY
HEP C AB: NONREACTIVE
SIGNAL TO CUT-OFF: 0.02 (ref <1.00)

## 2017-10-11 LAB — CYCLIC CITRUL PEPTIDE ANTIBODY, IGG

## 2017-10-11 LAB — RHEUMATOID FACTOR: Rhuematoid fact SerPl-aCnc: <14 IU/mL (ref <14)

## 2017-10-12 ENCOUNTER — Encounter: Payer: Self-pay | Admitting: Family Medicine

## 2017-10-12 ENCOUNTER — Encounter (INDEPENDENT_AMBULATORY_CARE_PROVIDER_SITE_OTHER): Payer: BLUE CROSS/BLUE SHIELD

## 2017-10-24 ENCOUNTER — Ambulatory Visit (INDEPENDENT_AMBULATORY_CARE_PROVIDER_SITE_OTHER): Payer: BLUE CROSS/BLUE SHIELD | Admitting: Family Medicine

## 2017-10-24 ENCOUNTER — Encounter (INDEPENDENT_AMBULATORY_CARE_PROVIDER_SITE_OTHER): Payer: Self-pay | Admitting: Family Medicine

## 2017-10-24 VITALS — BP 123/82 | HR 84 | Temp 97.7°F | Ht 66.0 in | Wt 257.0 lb

## 2017-10-24 DIAGNOSIS — Z0289 Encounter for other administrative examinations: Secondary | ICD-10-CM

## 2017-10-24 DIAGNOSIS — Z1331 Encounter for screening for depression: Secondary | ICD-10-CM | POA: Diagnosis not present

## 2017-10-24 DIAGNOSIS — R5383 Other fatigue: Secondary | ICD-10-CM

## 2017-10-24 DIAGNOSIS — Z9189 Other specified personal risk factors, not elsewhere classified: Secondary | ICD-10-CM | POA: Diagnosis not present

## 2017-10-24 DIAGNOSIS — J45909 Unspecified asthma, uncomplicated: Secondary | ICD-10-CM

## 2017-10-24 DIAGNOSIS — R739 Hyperglycemia, unspecified: Secondary | ICD-10-CM | POA: Diagnosis not present

## 2017-10-24 DIAGNOSIS — E559 Vitamin D deficiency, unspecified: Secondary | ICD-10-CM

## 2017-10-24 DIAGNOSIS — Z6841 Body Mass Index (BMI) 40.0 and over, adult: Secondary | ICD-10-CM

## 2017-10-24 NOTE — Progress Notes (Signed)
Office: [phone]  /  Fax: [phone]   Dear Dr. Brett [name],   Thank you for referring [name] to our clinic. The following note includes my evaluation and treatment recommendations.  HPI:   Chief Complaint: OBESITY    [name] has been referred by Briana Seat, MD for consultation regarding her obesity and obesity related comorbidities.    [name] (MR# [id number]) is a 57 y.o. female who presents on 10/24/2017 for obesity evaluation and treatment. Current BMI is Body mass index is 41.48 kg/m.Marland Kitchen [name] has been struggling with her weight for many years and has been unsuccessful in either losing weight, maintaining weight loss, or reaching her healthy weight goal. [name] has multiple health issues and social challenges, including work and caring for her elderly friend; which will make following a structured plan difficult.     [name] attended our information session and states she is currently in the action stage of change and ready to dedicate time achieving and maintaining a healthier weight. [name] is interested in becoming our patient and working on intensive lifestyle modifications including (but not limited to) diet, exercise and weight loss.    [name] states her family eats meals together her desired weight loss is 117 lbs she started gaining weight after being diagnosed with myasthenia gravis her heaviest weight ever was 262 lbs. she is a picky eater and doesn't like to eat healthier foods  she has significant food cravings issues  she snacks frequently in the evenings she skips meals frequently she is frequently drinking liquids with calories she struggles with emotional eating    Fatigue [name] feels her energy is lower than it should be. This has worsened with weight gain and has not worsened recently. [name] admits to daytime somnolence and admits to waking up still tired. Patient is at risk for obstructive sleep apnea.  Patent has a history of symptoms of daytime fatigue, morning fatigue and morning headache. Patient generally gets 2 or 3 hours of sleep per night, and states they generally have restless sleep. Snoring is present. Apneic episodes are present. Epworth Sleepiness Score is 13  Asthma [name] has a diagnosis of asthma and is on Atrovent and Albuterol currently. She is attempting to improve her asthma with weight loss.  Vitamin D deficiency [name] has a diagnosis of vitamin D deficiency. She is not currently taking vit D. [name] admits fatigue and denies nausea, vomiting or muscle weakness.  Hyperglycemia [name] has a history of some elevated blood glucose readings and elevated Hgb A1c in the past,without a diagnosis of diabetes. She denies polyphagia.  At risk for diabetes [name] is at higher than average risk for developing diabetes due to her obesity and hyperglycemia. She currently denies polyuria or polydipsia.  Depression Screen [name]'s Food and Mood (modified PHQ-9) score was  Depression screen PHQ 2/9 10/24/2017  Decreased Interest 3  Down, Depressed, Hopeless 2  PHQ - 2 Score 5  Altered sleeping 3  Tired, decreased energy 3  Change in appetite 1  Feeling bad or failure about yourself  1  Trouble concentrating 1  Moving slowly or fidgety/restless 0  Suicidal thoughts 0  PHQ-9 Score 14  Difficult doing work/chores Somewhat difficult    ALLERGIES: Allergies  Allergen Reactions  . Other Other (See Comments)    Cant take some "mycin" drugs   . Tape Other (See Comments)    Blister, irritates skin    MEDICATIONS: Current Outpatient Medications on File Prior to Visit  Medication Sig Dispense Refill  .  albuterol (PROVENTIL HFA;VENTOLIN HFA) 108 (90 Base) MCG/ACT inhaler Inhale 1 puff into the lungs every 6 (six) hours as needed for wheezing or shortness of breath. 1 Inhaler 6  . azithromycin (ZITHROMAX) 250 MG tablet Take 250 mg by mouth. Take as directed when needed     . HYDROcodone-acetaminophen (NORCO/VICODIN) 5-325 MG tablet Take 1 tablet by mouth every 6 hours as needed for moderate pain.  Ok to fill in 30 days 60 tablet 0  . HYDROcodone-acetaminophen (NORCO/VICODIN) 5-325 MG tablet Take 1 tablet by mouth every 6 (six) hours as needed for moderate pain. 60 tablet 0  . hyoscyamine (LEVSIN SL) 0.125 MG SL tablet PLACE 1 TABLET UNDER THE TONGUE EVERY 4 HOURS AS NEEDED FOR CRAMPING. 180 tablet 0  . ipratropium (ATROVENT) 0.03 % nasal spray Place 2 sprays into both nostrils 4 (four) times daily. 90 mL 3  . pantoprazole (PROTONIX) 40 MG tablet Take twice daily for 3 months, then daily 60 tablet 11  . pramipexole (MIRAPEX) 1.5 MG tablet TAKE 2 TABLETS (3 MG TOTAL) BY MOUTH 2 (TWO) TIMES DAILY AS NEEDED (FOR RESTLESS LEG SYNDROME 344 tablet 1  . predniSONE (DELTASONE) 20 MG tablet Take 20 mg by mouth. Take one tablet by mouth daily as needed    . pyridostigmine (MESTINON) 60 MG tablet Take 60 mg by mouth. Take one - two tablets daily    . tiZANidine (ZANAFLEX) 4 MG tablet Take 1 tablet (4 mg total) by mouth every 6 (six) hours as needed for muscle spasms (if unresponsive to cyclobenzaprine). (Patient taking differently: Take 4-6 mg by mouth every 6 (six) hours as needed for muscle spasms (if unresponsive to cyclobenzaprine). ) 30 tablet 0  . triamcinolone cream (KENALOG) 0.1 % Apply 1 application topically 2 (two) times daily. 45 g 0   No current facility-administered medications on file prior to visit.     PAST MEDICAL HISTORY: Past Medical History:  Diagnosis Date  . Abnormal liver function   . Anemia   . Arthritis    back & knees   . Asthma    uses albuterol inhaler if outside, cold weather & exercising   . Depression   . Dyspnea   . Fatty liver   . GERD (gastroesophageal reflux disease)   . History of hiatal hernia   . History of kidney stones    passed spontaneously  . IBS (irritable bowel syndrome)   . Iron deficiency anemia   . Joint pain     . Lactose intolerance   . Leg edema   . Melanoma ([place])    malignant, left leg,  wide excision, sentinel node, one positive stage 3, as a result- lymphdema, will be following with ONCOLOGY, PET scan- last 2017  . Myasthenia gravis ([place])   . Myasthenia gravis ([place]) 2000  . Osteoarthritis   . Pneumonia 2015   hosp. [facility] , treated with IV antibiotics   . Sleep apnea    sleep irregular pattern- no CPAP order solidified yet.   Marland Kitchen Spinal stenosis   . Vitamin B deficiency   . Vitamin D deficiency     PAST SURGICAL HISTORY: Past Surgical History:  Procedure Laterality Date  . APPENDECTOMY    . BACK SURGERY    . BREAST SURGERY Bilateral 2004   reduction   . EYE SURGERY Bilateral 1999   corneal peel  . MELANOMA EXCISION Left    removed from left leg  . OOPHORECTOMY Left   . THYMECTOMY  2001  .  TOTAL KNEE ARTHROPLASTY Left 01/2016  . WRIST SURGERY Left    plate & 6 screws     SOCIAL HISTORY: Social History   Tobacco Use  . Smoking status: Never Smoker  . Smokeless tobacco: Never Used  Substance Use Topics  . Alcohol use: Yes    Alcohol/week: 1.0 standard drinks    Types: 1 Standard drinks or equivalent per week    Comment: occassionally  . Drug use: No    FAMILY HISTORY: Family History  Problem Relation Age of Onset  . COPD Mother   . Lung cancer Father   . Diverticulitis Father   . Sleep apnea Father   . Colitis Neg Hx     ROS: Review of Systems  Constitutional: Positive for malaise/fatigue.  HENT: Positive for congestion (nasal stuffiness).   Eyes:       + Wear Glasses or Contacts  Respiratory: Positive for cough and wheezing.   Cardiovascular: Positive for orthopnea.       Positive Sudden Awakening from Sleep with Shortness of Breath Positive for Calf/Leg Pain with walking  Gastrointestinal: Positive for diarrhea and heartburn. Negative for nausea and vomiting.  Genitourinary: Negative for frequency.  Musculoskeletal: Positive for back pain and neck  pain.       Positive for neck stiffness Positive for muscle or joint pain Negative for muscle weakness  Skin: Positive for itching.  Endo/Heme/Allergies: Negative for polydipsia.  Psychiatric/Behavioral: Positive for depression. The patient has insomnia.     PHYSICAL EXAM: Blood pressure 123/82, pulse 84, temperature 97.7 F (36.5 C), temperature source Oral, height 5\' 6"  (1.676 m), weight 257 lb (116.6 kg), SpO2 98 %. Body mass index is 41.48 kg/m. Physical Exam  Constitutional: She is oriented to person, place, and time. She appears well-developed and well-nourished.  HENT:  Head: Normocephalic and atraumatic.  Nose: Nose normal.  Eyes: EOM are normal. No scleral icterus.  Neck: Normal range of motion. Neck supple. No thyromegaly present.  Cardiovascular: Normal rate and regular rhythm.  Pulmonary/Chest: Effort normal. No respiratory distress.  Abdominal: Soft. There is no tenderness.  + obesity  Musculoskeletal: Normal range of motion. She exhibits edema (trace edema right ankle and lower leg, 1 to 2+ edema in left ankle and leg).  Range of Motion normal in all 4 extremities  Neurological: She is alert and oriented to person, place, and time. Coordination normal.  Skin: Skin is warm and dry.  Psychiatric: She has a normal mood and affect. Her behavior is normal.  Vitals reviewed.   RECENT LABS AND TESTS: BMET    Component Value Date/Time   NA 140 10/09/2017 1604   K 3.7 10/09/2017 1604   CL 103 10/09/2017 1604   CO2 30 10/09/2017 1604   GLUCOSE 139 (H) 10/09/2017 1604   BUN 16 10/09/2017 1604   CREATININE 0.74 10/09/2017 1604   CREATININE 0.79 06/15/2017 1338   CALCIUM 9.4 10/09/2017 1604   GFRNONAA >60 06/15/2017 1338   GFRAA >60 06/15/2017 1338   Lab Results  Component Value Date   HGBA1C 5.8 (H) 04/26/2017   No results found for: INSULIN CBC    Component Value Date/Time   WBC 8.0 10/09/2017 1604   RBC 4.16 10/09/2017 1604   HGB 12.5 10/09/2017 1604    HGB 12.3 08/11/2017 0846   HCT 36.3 10/09/2017 1604   PLT 277.0 10/09/2017 1604   PLT 266 08/11/2017 0846   MCV 87.3 10/09/2017 1604   MCH 27.9 08/11/2017 0846   MCHC 34.5 10/09/2017  1604   RDW 15.0 10/09/2017 1604   LYMPHSABS 1.7 08/11/2017 0846   MONOABS 0.5 08/11/2017 0846   EOSABS 0.3 08/11/2017 0846   BASOSABS 0.0 08/11/2017 0846   Iron/TIBC/Ferritin/ %Sat    Component Value Date/Time   IRON 62 08/11/2017 0847   TIBC 315 08/11/2017 0847   FERRITIN 40.5 10/09/2017 1604   IRONPCTSAT 20 (L) 08/11/2017 0847   Lipid Panel     Component Value Date/Time   CHOL 215 (H) 04/26/2017 1029   TRIG 153 (H) 04/26/2017 1029   HDL 55 04/26/2017 1029   CHOLHDL 3.9 04/26/2017 1029   VLDL 31 04/26/2017 1029   LDLCALC 129 (H) 04/26/2017 1029   Hepatic Function Panel     Component Value Date/Time   PROT 6.9 10/09/2017 1604   ALBUMIN 4.2 10/09/2017 1604   AST 17 10/09/2017 1604   AST 15 06/15/2017 1338   ALT 16 10/09/2017 1604   ALT 17 06/15/2017 1338   ALKPHOS 111 10/09/2017 1604   BILITOT 0.3 10/09/2017 1604   BILITOT 0.3 06/15/2017 1338   BILIDIR <0.1 (L) 04/26/2017 1029   IBILI NOT CALCULATED 04/26/2017 1029      Component Value Date/Time   TSH 1.62 10/09/2017 1604   TSH 1.528 04/26/2017 1029   TSH 1.00 03/25/2008 1153    ECG  shows NSR with a rate of 92 BPM INDIRECT CALORIMETER done today shows a VO2 of 356 and a REE of 2478.  Her calculated basal metabolic rate is 5093 thus her basal metabolic rate is better than expected.    ASSESSMENT AND PLAN: Other fatigue - Plan: EKG 12-Lead, Lipid Panel With LDL/HDL Ratio, Vitamin B12, Folate  Asthma, unspecified asthma severity, unspecified whether complicated, unspecified whether persistent  Vitamin D deficiency - Plan: VITAMIN D 25 Hydroxy (Vit-D Deficiency, Fractures)  Hyperglycemia - Plan: Comprehensive metabolic panel, Hemoglobin A1c, Insulin, random  Depression screening  At risk for diabetes mellitus  Class 3  severe obesity with serious comorbidity and body mass index (BMI) of 40.0 to 44.9 in adult, unspecified obesity type ([place])  PLAN: Fatigue Ghina was informed that her fatigue may be related to obesity, depression or many other causes. Labs will be ordered, and in the meanwhile [name] has agreed to work on diet, exercise and weight loss to help with fatigue. Proper sleep hygiene was discussed including the need for 7-8 hours of quality sleep each night. A sleep study was not ordered based on symptoms and Epworth score.  Asthma We will order indirect calorimetry today and Chanese agrees to continue her medications as prescribed and start to work on diet and weight loss. I will work in the exercise component a little later. [name] agreed to follow up with our clinic in 2 weeks.  Vitamin D Deficiency [name] was informed that low vitamin D levels contributes to fatigue and are associated with obesity, breast, and colon cancer. We will check labs and results will be discussed with [name] in 2 weeks at her follow up visit [name] will follow up for routine testing of vitamin D, at least 2-3 times per year.   Hyperglycemia Fasting labs will be obtained and results will be discussed with [name] in 2 weeks at her follow up visit. In the meanwhile [name] was started on a lower simple carbohydrate diet and will work on weight loss efforts.  Diabetes risk counseling [name] was given extended (15 minutes) diabetes prevention counseling today. She is 57 y.o. female and has risk factors for diabetes including obesity and  hyperglycemia. We discussed intensive lifestyle modifications today with an emphasis on weight loss as well as increasing exercise and decreasing simple carbohydrates in her diet.  Depression Screen [name] had a moderately positive depression screening. Depression is commonly associated with obesity and often results in emotional eating behaviors. We will monitor this closely  and work on CBT to help improve the non-hunger eating patterns. Referral to Psychology may be required if no improvement is seen as she continues in our clinic.  Obesity After discussing the eating plan in depth; the patient was resistant to making many of the changes we suggested, preferring instead to rely on her own "research". [name] is not likely in the action stage of change and I have concerns about how ell she will comply with our instructions.  She has agreed to follow the Category 3 plan [name] has been instructed to eventually work up to a goal of 150 minutes of combined cardio and strengthening exercise per week for weight loss and overall health benefits. We discussed the following Behavioral Modification Strategies today: no skipping meals, increasing lean protein intake, decrease eating out and work on meal planning and easy cooking plans   She was informed of the importance of frequent follow up visits to maximize her success with intensive lifestyle modifications for her multiple health conditions. She was informed we would discuss her lab results at her next visit unless there is a critical issue that needs to be addressed sooner. [name] agreed to keep her next visit at the agreed upon time to discuss these results.    OBESITY BEHAVIORAL INTERVENTION VISIT  Today's visit was # 1   Starting weight: 257 lbs Starting date: 10/24/17 Today's weight : 257 lbs  Today's date: 10/24/2017 Total lbs lost to date: 0 At least 15 minutes were spent on discussing the following behavioral intervention visit.   ASK: We discussed the diagnosis of obesity with [name] today and [name] agreed to give [name] permission to discuss obesity behavioral modification therapy today.  ASSESS: Kaydence has the diagnosis of obesity and her BMI today is 41.5 [name] is in the action stage of change   ADVISE: Noga was educated on the multiple health risks of obesity as well as  the benefit of weight loss to improve her health. She was advised of the need for long term treatment and the importance of lifestyle modifications to improve her current health and to decrease her risk of future health problems.  AGREE: Multiple dietary modification options and treatment options were discussed and  Paityn agreed to follow the recommendations documented in the above note.  ARRANGE: Nieves was educated on the importance of frequent visits to treat obesity as outlined per CMS and USPSTF guidelines and agreed to schedule her next follow up appointment today.  I, Doreene Nest, am acting as transcriptionist for Dennard Nip, MD  I have reviewed the above documentation for accuracy and completeness, and I agree with the above. -Dennard Nip, MD

## 2017-10-25 LAB — COMPREHENSIVE METABOLIC PANEL
ALBUMIN: 4.4 g/dL (ref 3.5–5.5)
ALK PHOS: 122 IU/L — AB (ref 39–117)
ALT: 18 IU/L (ref 0–32)
AST: 20 IU/L (ref 0–40)
Albumin/Globulin Ratio: 1.5 (ref 1.2–2.2)
BILIRUBIN TOTAL: 0.5 mg/dL (ref 0.0–1.2)
BUN / CREAT RATIO: 20 (ref 9–23)
BUN: 17 mg/dL (ref 6–24)
CHLORIDE: 99 mmol/L (ref 96–106)
CO2: 25 mmol/L (ref 20–29)
Calcium: 9.6 mg/dL (ref 8.7–10.2)
Creatinine, Ser: 0.87 mg/dL (ref 0.57–1.00)
GFR calc Af Amer: 86 mL/min/{1.73_m2} (ref >59)
GFR calc non Af Amer: 74 mL/min/{1.73_m2} (ref >59)
GLUCOSE: 90 mg/dL (ref 65–99)
Globulin, Total: 2.9 g/dL (ref 1.5–4.5)
POTASSIUM: 4.2 mmol/L (ref 3.5–5.2)
Sodium: 140 mmol/L (ref 134–144)
Total Protein: 7.3 g/dL (ref 6.0–8.5)

## 2017-10-25 LAB — FOLATE: Folate: 9.6 ng/mL (ref >3.0)

## 2017-10-25 LAB — LIPID PANEL WITH LDL/HDL RATIO
Cholesterol, Total: 199 mg/dL (ref 100–199)
HDL: 52 mg/dL (ref >39)
LDL CALC: 121 mg/dL — AB (ref 0–99)
LDL/HDL RATIO: 2.3 ratio (ref 0.0–3.2)
TRIGLYCERIDES: 129 mg/dL (ref 0–149)
VLDL Cholesterol Cal: 26 mg/dL (ref 5–40)

## 2017-10-25 LAB — HEMOGLOBIN A1C
Est. average glucose Bld gHb Est-mCnc: 111 mg/dL
Hgb A1c MFr Bld: 5.5 % (ref 4.8–5.6)

## 2017-10-25 LAB — VITAMIN D 25 HYDROXY (VIT D DEFICIENCY, FRACTURES): VIT D 25 HYDROXY: 19 ng/mL — AB (ref 30.0–100.0)

## 2017-10-25 LAB — INSULIN, RANDOM: INSULIN: 25.8 u[IU]/mL — ABNORMAL HIGH (ref 2.6–24.9)

## 2017-10-25 LAB — VITAMIN B12: Vitamin B-12: 249 pg/mL (ref 232–1245)

## 2017-11-06 ENCOUNTER — Other Ambulatory Visit: Payer: Self-pay | Admitting: Family Medicine

## 2017-11-06 DIAGNOSIS — R52 Pain, unspecified: Secondary | ICD-10-CM

## 2017-11-06 NOTE — Telephone Encounter (Signed)
Copied from [place] [id number]. Topic: Quick Communication - Rx Refill/Question >> Nov 06, 2017  3:07 PM Wynetta Emery, [place] C wrote: Medication: HYDROcodone-acetaminophen (NORCO/VICODIN) 5-325 MG tablet   Has the patient contacted their pharmacy? Yes   (Agent: If no, request that the patient contact the pharmacy for the refill.) (Agent: If yes, when and what did the pharmacy advise?)  Preferred Pharmacy (with phone number or street name): [id number] [address], [place] [phone] (Phone) [phone] (Fax)    Agent: Please be advised that RX refills may take up to 3 business days. We ask that you follow-up with your pharmacy.

## 2017-11-07 MED ORDER — HYDROCODONE-ACETAMINOPHEN 5-325 MG PO TABS: ORAL_TABLET | ORAL | 0 refills | Status: DC

## 2017-11-07 MED ORDER — HYDROCODONE-ACETAMINOPHEN 5-325 MG PO TABS: 1.0000 | ORAL_TABLET | Freq: Four times a day (QID) | ORAL | 0 refills | Status: DC | PRN

## 2017-11-07 NOTE — Telephone Encounter (Signed)
Refill of norco  LOV 10/09/17 Dr. Lorelei Pont  LRF 09/27/17  #60  0 refills   [id number] [address], [place] - [place]    [phone] (Phone) [phone] (Fax)

## 2017-11-07 NOTE — Telephone Encounter (Signed)
Last hydrocodone RX: 09/27/17, #60 Last OV: 10/09/17 Next OV: 11/13/17 UDS: 07/12/17  CSC: 07/12/17 CSR: No discrepancies identified

## 2017-11-08 NOTE — Progress Notes (Signed)
[facility] at  [address] [place], [place] [address] [phone]  Date:  11/13/2017   Name:  [name]   DOB:  1960/11/06   MRN:  [id number]  PCP:  Darreld Mclean, MD    Chief Complaint: Lymphedema (4-6 weeks) and Medication Management (started on metformin, would like to look at labs-advise on medication, proper poc)   History of Present Illness:  [name] is a 57 y.o. very pleasant female patient who presents with the following:  Following up today History of significant generalized lymphedema, spinal stenosis in her lumbar spine with chronic pain, RLS, B12 def/ pernicious anemia and myasthenia gravis, melanoma I last saw her about a month ago as her lymphedema specialist in [place] had wanted me to do some eval for other possible causes of her lymphedema- however her labs were all normal  Blood counts normal  Metabolic profile normal  Sed rate and CRP, rheumatoid factor, ANA and CCP all normal- no suggestion of an auto-immune disorder  Thyroid normal  Hep C screening negative  BNP does not show any suggestion of heart failure  The good news is that your labs are all normal- however this does not help [name] in determining any other cause of your edema.  When do you see your edema doc in [facility] again? Would you please be sure to take a copy of your labs to show her?   She is a pt at [facility] for neurology care and plans to see them for her myasthenia gravis- her appt is in about 3 weeks with Dr. Keturah Barre.  Also recently [facility] - she started her on metformin  She is tolerating this pretty well so far, just started on it however  Flu: she has been told not to get in the past due to [facility]- ?will ask Dr. D about this at her upcoming visit  Tetanus: same as above  She is using hydrocodone for her pain.  Written for up 4x a day but she gets 60- per  month-she still has a lot of pain esp at night when she is trying to go to sleep  She had seen Dr. Maryjean Ka with [facility] in the past and may see him again about any other options  Wt Readings from Last 3 Encounters:  11/13/17 257 lb (116.6 kg)  11/09/17 255 lb (115.7 kg)  10/24/17 257 lb (116.6 kg)   Her swelling seems to be about the same so far. Her left leg is the worst but she has other swelling as well No SOB Weight is stable  Patient Active Problem List   Diagnosis Date Noted  . Vitamin D deficiency 10/24/2017  . Hyperglycemia 10/24/2017  . Other fatigue 10/24/2017  . IDA (iron deficiency anemia) 06/21/2017  . Spondylolisthesis at L3-L4 level 03/01/2017  . Allergy to environmental factors 09/15/2016  . Chronic low back pain 09/15/2016  . Degenerative spondylolisthesis 09/15/2016  . Depression 09/15/2016  . Leg edema 09/15/2016  . Lumbosacral spondylosis without myelopathy 09/15/2016  . Osteoarthritis of knees, bilateral 09/15/2016  . Pernicious anemia 09/15/2016  . 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 Malignant melanoma of thigh, left ([place]) Stage III 11/05/2013  . CERVICAL RADICULOPATHY, LEFT 06/04/2008  . MEDIAL EPICONDYLITIS 06/04/2008  . ANEMIA-NOS 03/25/2008  . ALLERGIC RHINITIS 03/25/2008  . Asthma 03/25/2008  . FATIGUE 03/25/2008  . B12 DEFICIENCY  06/04/2007  . RESTLESS LEG SYNDROME 06/04/2007  . MYASTHENIA GRAVIS WITHOUT EXACERBATION 06/04/2007  . GERD 06/04/2007  . IRRITABLE BOWEL SYNDROME 06/04/2007    Past Medical History:  Diagnosis Date  . Abnormal liver function   . Anemia   . Arthritis    back & knees   . Asthma    uses albuterol inhaler if outside, cold weather & exercising   . Depression   . Dyspnea   . Fatty liver   . GERD (gastroesophageal reflux disease)   . History of hiatal hernia   . History of kidney stones    passed spontaneously  . IBS (irritable bowel syndrome)   . Iron  deficiency anemia   . Joint pain   . Lactose intolerance   . Leg edema   . Melanoma ([place])    malignant, left leg,  wide excision, sentinel node, one positive stage 3, as a result- lymphdema, will be following with ONCOLOGY, PET scan- last 2017  . Myasthenia gravis ([place])   . Myasthenia gravis ([place]) 2000  . Osteoarthritis   . Pneumonia 2015   hosp. [facility] , treated with IV antibiotics   . Sleep apnea    sleep irregular pattern- no CPAP order solidified yet.   Marland Kitchen Spinal stenosis   . Vitamin B deficiency   . Vitamin D deficiency     Past Surgical History:  Procedure Laterality Date  . APPENDECTOMY    . BACK SURGERY    . BREAST SURGERY Bilateral 2004   reduction   . EYE SURGERY Bilateral 1999   corneal peel  . MELANOMA EXCISION Left    removed from left leg  . OOPHORECTOMY Left   . THYMECTOMY  2001  . TOTAL KNEE ARTHROPLASTY Left 01/2016  . WRIST SURGERY Left    plate & 6 screws     Social History   Tobacco Use  . Smoking status: Never Smoker  . Smokeless tobacco: Never Used  Substance Use Topics  . Alcohol use: Yes    Alcohol/week: 1.0 standard drinks    Types: 1 Standard drinks or equivalent per week    Comment: occassionally  . Drug use: No    Family History  Problem Relation Age of Onset  . COPD Mother   . Lung cancer Father   . Diverticulitis Father   . Sleep apnea Father   . Colitis Neg Hx     Allergies  Allergen Reactions  . Other Other (See Comments)    Cant take some "mycin" drugs   . Tape Other (See Comments)    Blister, irritates skin    Medication list has been reviewed and updated.  Current Outpatient Medications on File Prior to Visit  Medication Sig Dispense Refill  . albuterol (PROVENTIL HFA;VENTOLIN HFA) 108 (90 Base) MCG/ACT inhaler Inhale 1 puff into the lungs every 6 (six) hours as needed for wheezing or shortness of breath. 1 Inhaler 6  . HYDROcodone-acetaminophen (NORCO/VICODIN) 5-325 MG tablet Take 1 tablet by mouth every 6  (six) hours as needed for moderate pain. 60 tablet 0  . HYDROcodone-acetaminophen (NORCO/VICODIN) 5-325 MG tablet Take 1 tablet by mouth every 6 hours as needed for moderate pain.  Ok to fill in 30 days 60 tablet 0  . ipratropium (ATROVENT) 0.03 % nasal spray Place 2 sprays into both nostrils 4 (four) times daily. 90 mL 3  . metFORMIN (GLUCOPHAGE) 500 MG tablet Take 1 tablet (500 mg total) by mouth daily with breakfast. 30 tablet 0  .  pantoprazole (PROTONIX) 40 MG tablet Take twice daily for 3 months, then daily 60 tablet 11  . pramipexole (MIRAPEX) 1.5 MG tablet TAKE 2 TABLETS (3 MG TOTAL) BY MOUTH 2 (TWO) TIMES DAILY AS NEEDED (FOR RESTLESS LEG SYNDROME 344 tablet 1  . pyridostigmine (MESTINON) 60 MG tablet Take 60 mg by mouth. Take one - two tablets daily    . tiZANidine (ZANAFLEX) 4 MG tablet Take 1 tablet (4 mg total) by mouth every 6 (six) hours as needed for muscle spasms (if unresponsive to cyclobenzaprine). (Patient taking differently: Take 4-6 mg by mouth every 6 (six) hours as needed for muscle spasms (if unresponsive to cyclobenzaprine). ) 30 tablet 0  . triamcinolone cream (KENALOG) 0.1 % Apply 1 application topically 2 (two) times daily. 45 g 0   No current facility-administered medications on file prior to visit.     Review of Systems:  As per HPI- otherwise negative. No fever or chills   Physical Examination: Vitals:   11/13/17 1048  BP: 124/80  Pulse: 88  Resp: 18  Temp: (!) 97.5 F (36.4 C)  SpO2: 96%   Vitals:   11/13/17 1048  Weight: 257 lb (116.6 kg)  Height: 5\' 6"  (1.676 m)   Body mass index is 41.48 kg/m. Ideal Body Weight: Weight in (lb) to have BMI = 25: 154.6  GEN: WDWN, NAD, Non-toxic, A & O x 3, looks well, obese with lymphedema as well  HEENT: Atraumatic, Normocephalic. Neck supple. No masses, No LAD.  Bilateral TM wnl, oropharynx normal.  PEERL,EOMI.   Ears and Nose: No external deformity. CV: RRR, No M/G/R. No JVD. No thrill. No extra heart  sounds. PULM: CTA B, no wheezes, crackles, rhonchi. No retractions. No resp. distress. No accessory muscle use. ABD: S, NT, ND EXTR: No c/c/e NEURO Normal gait.  PSYCH: Normally interactive. Conversant. Not depressed or anxious appearing.  Calm demeanor.    Assessment and Plan: Vitamin D deficiency - Plan: Cholecalciferol (VITAMIN D3) 50000 units TABS  Immunization due  Class 3 severe obesity with serious comorbidity and body mass index (BMI) of 40.0 to 44.9 in adult, unspecified obesity type (HCC)  Lymphedema  Hyperglycemia  Chronic low back pain, unspecified back pain laterality, with sciatica presence unspecified  Myasthenia gravis without acute exacerbation ([place])  Following up today She continues to see her lymphedema specialist in [facility].  unfortunately so far we have not been able to find any other cause of her lymphedema.  This started following a node dissection for melanoma on her leg but then seems to have become more generalized Reassured that I think her taking metformin for her weight loss and hyperglycemia is appropriate She will see Dr. Brett Fairy soon about her MG, will also ask her about flu and Tdap vaccines She was noted to have vit D def recently will replete for her  Discussed how to manage her back pain.  If she needs to take 7.5 to 10 mg of hydrocodone at bedtime that is ok, I can increase her monthly allotment to 90 if need be  She also plans to see her neurosurgeon about any other possible options for her pain  Signed Lamar Blinks, MD

## 2017-11-09 ENCOUNTER — Encounter (INDEPENDENT_AMBULATORY_CARE_PROVIDER_SITE_OTHER): Payer: Self-pay

## 2017-11-09 ENCOUNTER — Ambulatory Visit (INDEPENDENT_AMBULATORY_CARE_PROVIDER_SITE_OTHER): Payer: BLUE CROSS/BLUE SHIELD | Admitting: Family Medicine

## 2017-11-09 ENCOUNTER — Ambulatory Visit: Payer: BLUE CROSS/BLUE SHIELD | Admitting: Family Medicine

## 2017-11-09 ENCOUNTER — Ambulatory Visit (INDEPENDENT_AMBULATORY_CARE_PROVIDER_SITE_OTHER): Payer: Self-pay | Admitting: Family Medicine

## 2017-11-09 VITALS — BP 118/76 | HR 89 | Temp 97.7°F | Ht 66.0 in | Wt 255.0 lb

## 2017-11-09 DIAGNOSIS — E559 Vitamin D deficiency, unspecified: Secondary | ICD-10-CM | POA: Diagnosis not present

## 2017-11-09 DIAGNOSIS — E8881 Metabolic syndrome: Secondary | ICD-10-CM | POA: Diagnosis not present

## 2017-11-09 DIAGNOSIS — Z6841 Body Mass Index (BMI) 40.0 and over, adult: Secondary | ICD-10-CM

## 2017-11-09 DIAGNOSIS — Z9189 Other specified personal risk factors, not elsewhere classified: Secondary | ICD-10-CM | POA: Diagnosis not present

## 2017-11-09 MED ORDER — METFORMIN HCL 500 MG PO TABS: 500.0000 mg | ORAL_TABLET | Freq: Every day | ORAL | 0 refills | Status: DC

## 2017-11-13 ENCOUNTER — Ambulatory Visit (INDEPENDENT_AMBULATORY_CARE_PROVIDER_SITE_OTHER): Payer: BLUE CROSS/BLUE SHIELD | Admitting: Family Medicine

## 2017-11-13 ENCOUNTER — Encounter: Payer: Self-pay | Admitting: Family Medicine

## 2017-11-13 VITALS — BP 124/80 | HR 88 | Temp 97.5°F | Resp 18 | Ht 66.0 in | Wt 257.0 lb

## 2017-11-13 DIAGNOSIS — I890 Lymphedema, not elsewhere classified: Secondary | ICD-10-CM

## 2017-11-13 DIAGNOSIS — G7000 Myasthenia gravis without (acute) exacerbation: Secondary | ICD-10-CM

## 2017-11-13 DIAGNOSIS — Z6841 Body Mass Index (BMI) 40.0 and over, adult: Secondary | ICD-10-CM

## 2017-11-13 DIAGNOSIS — E559 Vitamin D deficiency, unspecified: Secondary | ICD-10-CM

## 2017-11-13 DIAGNOSIS — Z23 Encounter for immunization: Secondary | ICD-10-CM | POA: Diagnosis not present

## 2017-11-13 DIAGNOSIS — G8929 Other chronic pain: Secondary | ICD-10-CM

## 2017-11-13 DIAGNOSIS — R739 Hyperglycemia, unspecified: Secondary | ICD-10-CM

## 2017-11-13 DIAGNOSIS — M545 Low back pain: Secondary | ICD-10-CM

## 2017-11-13 MED ORDER — VITAMIN D3 1.25 MG (50000 UT) PO TABS: 1.0000 | ORAL_TABLET | ORAL | 0 refills | Status: DC

## 2017-11-13 NOTE — Patient Instructions (Addendum)
Please ask Dr. Keturah Barre about flu and "tdap" tetanus boosters - if you are ok to get these we are glad to give them to you I gave you vitamin D replacement- 50,000 iu, take once a WEEK for 12 weeks. Then go on a daily 2,000 iu OTC supplement to maintain your levels  Certainly consider seeing Dr. Maryjean Ka for follow-up to see if he can offer you anything as far as your pain. However if you want to schedule your daily hydrocodone so that you take 1.5 or 2 tablets at bedtime it is ok to try this

## 2017-11-14 NOTE — Progress Notes (Signed)
Office: [phone]  /  Fax: [phone]   HPI:   Chief Complaint: OBESITY [name] is here to discuss her progress with her obesity treatment plan. She is on the Category 3 plan and is following her eating plan approximately 80-85 % of the time. She states she is exercising 0 minutes 0 times per week. [name] traveled for 1 week, so she was only able to follow her plan for a few days. She was mindful of her food choices while traveling.  Her weight is 255 lb (115.7 kg) today and has had a weight loss of 2 pounds over a period of 2 to 3 weeks since her last visit. She has lost 2 lbs since starting treatment with [name].  Vitamin D Deficiency [name] has a new diagnosis of vitamin D deficiency. She is not on Vit D, she notes fatigue and denies nausea, vomiting or muscle weakness.  Insulin Resistance [name] has a new diagnosis of insulin resistance based on her elevated fasting insulin level >5. Her fasting insulin is elevated and she notes polyphagia, and denies nausea or vomiting. Although [name]'s blood glucose readings are still under good control, insulin resistance puts her at greater risk of metabolic syndrome and diabetes. She is not taking metformin currently and continues to work on diet and exercise to decrease risk of diabetes.  At risk for diabetes [name] is at higher than average risk for developing diabetes due to her obesity and insulin resistance. She currently denies polyuria or polydipsia.  ALLERGIES: Allergies  Allergen Reactions  . Other Other (See Comments)    Cant take some "mycin" drugs   . Tape Other (See Comments)    Blister, irritates skin    MEDICATIONS: Current Outpatient Medications on File Prior to Visit  Medication Sig Dispense Refill  . albuterol (PROVENTIL HFA;VENTOLIN HFA) 108 (90 Base) MCG/ACT inhaler Inhale 1 puff into the lungs every 6 (six) hours as needed for wheezing or shortness of breath. 1 Inhaler 6  . HYDROcodone-acetaminophen  (NORCO/VICODIN) 5-325 MG tablet Take 1 tablet by mouth every 6 (six) hours as needed for moderate pain. 60 tablet 0  . HYDROcodone-acetaminophen (NORCO/VICODIN) 5-325 MG tablet Take 1 tablet by mouth every 6 hours as needed for moderate pain.  Ok to fill in 30 days 60 tablet 0  . ipratropium (ATROVENT) 0.03 % nasal spray Place 2 sprays into both nostrils 4 (four) times daily. 90 mL 3  . pantoprazole (PROTONIX) 40 MG tablet Take twice daily for 3 months, then daily 60 tablet 11  . pramipexole (MIRAPEX) 1.5 MG tablet TAKE 2 TABLETS (3 MG TOTAL) BY MOUTH 2 (TWO) TIMES DAILY AS NEEDED (FOR RESTLESS LEG SYNDROME 344 tablet 1  . pyridostigmine (MESTINON) 60 MG tablet Take 60 mg by mouth. Take one - two tablets daily    . tiZANidine (ZANAFLEX) 4 MG tablet Take 1 tablet (4 mg total) by mouth every 6 (six) hours as needed for muscle spasms (if unresponsive to cyclobenzaprine). (Patient taking differently: Take 4-6 mg by mouth every 6 (six) hours as needed for muscle spasms (if unresponsive to cyclobenzaprine). ) 30 tablet 0  . triamcinolone cream (KENALOG) 0.1 % Apply 1 application topically 2 (two) times daily. 45 g 0   No current facility-administered medications on file prior to visit.     PAST MEDICAL HISTORY: Past Medical History:  Diagnosis Date  . Abnormal liver function   . Anemia   . Arthritis    back & knees   . Asthma  uses albuterol inhaler if outside, cold weather & exercising   . Depression   . Dyspnea   . Fatty liver   . GERD (gastroesophageal reflux disease)   . History of hiatal hernia   . History of kidney stones    passed spontaneously  . IBS (irritable bowel syndrome)   . Iron deficiency anemia   . Joint pain   . Lactose intolerance   . Leg edema   . Melanoma ([place])    malignant, left leg,  wide excision, sentinel node, one positive stage 3, as a result- lymphdema, will be following with ONCOLOGY, PET scan- last 2017  . Myasthenia gravis ([place])   . Myasthenia gravis  ([place]) 2000  . Osteoarthritis   . Pneumonia 2015   hosp. [facility] , treated with IV antibiotics   . Sleep apnea    sleep irregular pattern- no CPAP order solidified yet.   Marland Kitchen Spinal stenosis   . Vitamin B deficiency   . Vitamin D deficiency     PAST SURGICAL HISTORY: Past Surgical History:  Procedure Laterality Date  . APPENDECTOMY    . BACK SURGERY    . BREAST SURGERY Bilateral 2004   reduction   . EYE SURGERY Bilateral 1999   corneal peel  . MELANOMA EXCISION Left    removed from left leg  . OOPHORECTOMY Left   . THYMECTOMY  2001  . TOTAL KNEE ARTHROPLASTY Left 01/2016  . WRIST SURGERY Left    plate & 6 screws     SOCIAL HISTORY: Social History   Tobacco Use  . Smoking status: Never Smoker  . Smokeless tobacco: Never Used  Substance Use Topics  . Alcohol use: Yes    Alcohol/week: 1.0 standard drinks    Types: 1 Standard drinks or equivalent per week    Comment: occassionally  . Drug use: No    FAMILY HISTORY: Family History  Problem Relation Age of Onset  . COPD Mother   . Lung cancer Father   . Diverticulitis Father   . Sleep apnea Father   . Colitis Neg Hx     ROS: Review of Systems  Constitutional: Positive for malaise/fatigue and weight loss.  Gastrointestinal: Negative for nausea and vomiting.  Genitourinary: Negative for frequency.  Musculoskeletal:       Negative muscle weakness  Endo/Heme/Allergies: Negative for polydipsia.       Positive polyphagia    PHYSICAL EXAM: Blood pressure 118/76, pulse 89, temperature 97.7 F (36.5 C), temperature source Oral, height 5\' 6"  (1.676 m), weight 255 lb (115.7 kg), SpO2 96 %. Body mass index is 41.16 kg/m. Physical Exam  Constitutional: She is oriented to person, place, and time. She appears well-developed and well-nourished.  Cardiovascular: Normal rate.  Pulmonary/Chest: Effort normal.  Musculoskeletal: Normal range of motion.  Neurological: She is oriented to person, place, and time.  Skin:  Skin is warm and dry.  Psychiatric: She has a normal mood and affect. Her behavior is normal.  Vitals reviewed.   RECENT LABS AND TESTS: BMET    Component Value Date/Time   NA 140 10/24/2017 0935   K 4.2 10/24/2017 0935   CL 99 10/24/2017 0935   CO2 25 10/24/2017 0935   GLUCOSE 90 10/24/2017 0935   GLUCOSE 139 (H) 10/09/2017 1604   BUN 17 10/24/2017 0935   CREATININE 0.87 10/24/2017 0935   CREATININE 0.79 06/15/2017 1338   CALCIUM 9.6 10/24/2017 0935   GFRNONAA 74 10/24/2017 0935   GFRNONAA >60 06/15/2017 1338  GFRAA 86 10/24/2017 0935   GFRAA >60 06/15/2017 1338   Lab Results  Component Value Date   HGBA1C 5.5 10/24/2017   HGBA1C 5.8 (H) 04/26/2017   Lab Results  Component Value Date   INSULIN 25.8 (H) 10/24/2017   CBC    Component Value Date/Time   WBC 8.0 10/09/2017 1604   RBC 4.16 10/09/2017 1604   HGB 12.5 10/09/2017 1604   HGB 12.3 08/11/2017 0846   HCT 36.3 10/09/2017 1604   PLT 277.0 10/09/2017 1604   PLT 266 08/11/2017 0846   MCV 87.3 10/09/2017 1604   MCH 27.9 08/11/2017 0846   MCHC 34.5 10/09/2017 1604   RDW 15.0 10/09/2017 1604   LYMPHSABS 1.7 08/11/2017 0846   MONOABS 0.5 08/11/2017 0846   EOSABS 0.3 08/11/2017 0846   BASOSABS 0.0 08/11/2017 0846   Iron/TIBC/Ferritin/ %Sat    Component Value Date/Time   IRON 62 08/11/2017 0847   TIBC 315 08/11/2017 0847   FERRITIN 40.5 10/09/2017 1604   IRONPCTSAT 20 (L) 08/11/2017 0847   Lipid Panel     Component Value Date/Time   CHOL 199 10/24/2017 0935   TRIG 129 10/24/2017 0935   HDL 52 10/24/2017 0935   CHOLHDL 3.9 04/26/2017 1029   VLDL 31 04/26/2017 1029   LDLCALC 121 (H) 10/24/2017 0935   Hepatic Function Panel     Component Value Date/Time   PROT 7.3 10/24/2017 0935   ALBUMIN 4.4 10/24/2017 0935   AST 20 10/24/2017 0935   AST 15 06/15/2017 1338   ALT 18 10/24/2017 0935   ALT 17 06/15/2017 1338   ALKPHOS 122 (H) 10/24/2017 0935   BILITOT 0.5 10/24/2017 0935   BILITOT 0.3  06/15/2017 1338   BILIDIR <0.1 (L) 04/26/2017 1029   IBILI NOT CALCULATED 04/26/2017 1029      Component Value Date/Time   TSH 1.62 10/09/2017 1604   TSH 1.528 04/26/2017 1029   TSH 1.00 03/25/2008 1153  Results for RHIANNA, RAULERSON (MRN [id number]) as of 11/14/2017 09:27  Ref. Range 10/24/2017 09:35  Vitamin D, 25-Hydroxy Latest Ref Range: 30.0 - 100.0 ng/mL 19.0 (L)    ASSESSMENT AND PLAN: Vitamin D deficiency  Insulin resistance - Plan: metFORMIN (GLUCOPHAGE) 500 MG tablet  At risk for diabetes mellitus  Class 3 severe obesity with serious comorbidity and body mass index (BMI) of 40.0 to 44.9 in adult, unspecified obesity type ([place])  PLAN:  Vitamin D Deficiency [name] was informed that low vitamin D levels contributes to fatigue and are associated with obesity, breast, and colon cancer. [name] agrees to start prescription Vit D @50 ,000 IU every week #4 with no refills. She will follow up for routine testing of vitamin D, at least 2-3 times per year. She was informed of the risk of over-replacement of vitamin D and agrees to not increase her dose unless she discusses this with [name] first. [name] agrees to follow up with our clinic in 2 to 3 weeks.  Insulin Resistance [name] will continue to work on weight loss, exercise, and decreasing simple carbohydrates in her diet to help decrease the risk of diabetes. We dicussed metformin including benefits and risks. She was informed that eating too many simple carbohydrates or too many calories at one sitting increases the likelihood of GI side effects. [name] agrees to start metformin 500 mg q AM #30 with no refills. [name] agrees to follow up with our clinic in 2 to 3 weeks as directed to monitor her progress.  Diabetes risk counselling [name] was given  extended (30 minutes) diabetes prevention counseling today. She is 57 y.o. female and has risk factors for diabetes including obesity and insulin resistance. We discussed  intensive lifestyle modifications today with an emphasis on weight loss as well as increasing exercise and decreasing simple carbohydrates in her diet.  Obesity [name] is currently in the action stage of change. As such, her goal is to continue with weight loss efforts She has agreed to follow the Category 3 plan [name] has been instructed to work up to a goal of 150 minutes of combined cardio and strengthening exercise per week for weight loss and overall health benefits. We discussed the following Behavioral Modification Strategies today: increasing lean protein intake and decreasing simple carbohydrates    [name] has agreed to follow up with our clinic in 2 to 3 weeks. She was informed of the importance of frequent follow up visits to maximize her success with intensive lifestyle modifications for her multiple health conditions.   OBESITY BEHAVIORAL INTERVENTION VISIT  Today's visit was # 2   Starting weight: 257 lbs Starting date: 10/24/17 Today's weight : 255 lbs  Today's date: 11/09/2017 Total lbs lost to date: 2    ASK: We discussed the diagnosis of obesity with [name] today and Shalonda agreed to give [name] permission to discuss obesity behavioral modification therapy today.  ASSESS: [name] has the diagnosis of obesity and her BMI today is 41.5 [name] is in the action stage of change   ADVISE: [name] was educated on the multiple health risks of obesity as well as the benefit of weight loss to improve her health. She was advised of the need for long term treatment and the importance of lifestyle modifications to improve her current health and to decrease her risk of future health problems.  AGREE: Multiple dietary modification options and treatment options were discussed and  [name] agreed to follow the recommendations documented in the above note.  ARRANGE: [name] was educated on the importance of frequent visits to treat obesity as outlined per  CMS and USPSTF guidelines and agreed to schedule her next follow up appointment today.  I, Trixie Dredge, am acting as transcriptionist for Dennard Nip, MD  I have reviewed the above documentation for accuracy and completeness, and I agree with the above. -Dennard Nip, MD

## 2017-11-15 ENCOUNTER — Ambulatory Visit: Payer: BLUE CROSS/BLUE SHIELD | Admitting: Family Medicine

## 2017-11-17 ENCOUNTER — Other Ambulatory Visit: Payer: Self-pay | Admitting: Critical Care Medicine

## 2017-11-23 ENCOUNTER — Ambulatory Visit (INDEPENDENT_AMBULATORY_CARE_PROVIDER_SITE_OTHER): Payer: BLUE CROSS/BLUE SHIELD | Admitting: Bariatrics

## 2017-11-23 ENCOUNTER — Encounter (INDEPENDENT_AMBULATORY_CARE_PROVIDER_SITE_OTHER): Payer: Self-pay | Admitting: Bariatrics

## 2017-11-23 VITALS — BP 123/78 | HR 92 | Temp 98.3°F | Ht 66.0 in | Wt 249.0 lb

## 2017-11-23 DIAGNOSIS — Z6841 Body Mass Index (BMI) 40.0 and over, adult: Secondary | ICD-10-CM

## 2017-11-23 DIAGNOSIS — E559 Vitamin D deficiency, unspecified: Secondary | ICD-10-CM

## 2017-11-23 DIAGNOSIS — Z9189 Other specified personal risk factors, not elsewhere classified: Secondary | ICD-10-CM | POA: Diagnosis not present

## 2017-11-23 DIAGNOSIS — E8881 Metabolic syndrome: Secondary | ICD-10-CM | POA: Diagnosis not present

## 2017-11-23 MED ORDER — METFORMIN HCL 500 MG PO TABS: 500.0000 mg | ORAL_TABLET | Freq: Every day | ORAL | 0 refills | Status: DC

## 2017-11-27 NOTE — Progress Notes (Signed)
Office: [phone]  /  Fax: [phone]   HPI:   Chief Complaint: OBESITY [name] is here to discuss her progress with her obesity treatment plan. She is on the Category 3 plan and is following her eating plan approximately 85 % of the time. She states she is exercising 0 minutes 0 times per week. [name]'s diet is more structured with the Category 3 plan. She is having occasional hunger at night.  Her weight is 249 lb (112.9 kg) today and has had a weight loss of 6 pounds over a period of 2 weeks since her last visit. She has lost 8 lbs since starting treatment with [name].  Insulin Resistance [name] has a diagnosis of insulin resistance based on her elevated fasting insulin level >5. Although [name]'s blood glucose readings are still under good control, insulin resistance puts her at greater risk of metabolic syndrome and diabetes. She is taking metformin currently and continues to work on diet and exercise to decrease risk of diabetes. She denies polyphagia.  At risk for diabetes [name] is at higher than average risk for developing diabetes due to her insulin resistance and obesity.  Vitamin D deficiency [name] has a diagnosis of vitamin D deficiency. She is currently taking high dose vit D. Her last vitamin D level was 19.0 on 10/24/17. She denies nausea, vomiting or muscle weakness.  ALLERGIES: Allergies  Allergen Reactions  . Other Other (See Comments)    Cant take some "mycin" drugs   . Tape Other (See Comments)    Blister, irritates skin    MEDICATIONS: Current Outpatient Medications on File Prior to Visit  Medication Sig Dispense Refill  . albuterol (PROVENTIL HFA;VENTOLIN HFA) 108 (90 Base) MCG/ACT inhaler Inhale 1 puff into the lungs every 6 (six) hours as needed for wheezing or shortness of breath. 1 Inhaler 6  . Cholecalciferol (VITAMIN D3) 50000 units TABS Take 1 tablet by mouth once a week. 12 tablet 0  . HYDROcodone-acetaminophen (NORCO/VICODIN) 5-325 MG  tablet Take 1 tablet by mouth every 6 (six) hours as needed for moderate pain. 60 tablet 0  . HYDROcodone-acetaminophen (NORCO/VICODIN) 5-325 MG tablet Take 1 tablet by mouth every 6 hours as needed for moderate pain.  Ok to fill in 30 days 60 tablet 0  . ipratropium (ATROVENT) 0.03 % nasal spray Place 2 sprays into both nostrils 4 (four) times daily. 90 mL 3  . pantoprazole (PROTONIX) 40 MG tablet Take twice daily for 3 months, then daily 60 tablet 11  . pramipexole (MIRAPEX) 1.5 MG tablet TAKE 2 TABLETS (3 MG TOTAL) BY MOUTH 2 (TWO) TIMES DAILY AS NEEDED (FOR RESTLESS LEG SYNDROME 344 tablet 1  . pyridostigmine (MESTINON) 60 MG tablet Take 60 mg by mouth. Take one - two tablets daily    . tiZANidine (ZANAFLEX) 4 MG tablet Take 1 tablet (4 mg total) by mouth every 6 (six) hours as needed for muscle spasms (if unresponsive to cyclobenzaprine). (Patient taking differently: Take 4-6 mg by mouth every 6 (six) hours as needed for muscle spasms (if unresponsive to cyclobenzaprine). ) 30 tablet 0  . triamcinolone cream (KENALOG) 0.1 % Apply 1 application topically 2 (two) times daily. 45 g 0   No current facility-administered medications on file prior to visit.     PAST MEDICAL HISTORY: Past Medical History:  Diagnosis Date  . Abnormal liver function   . Anemia   . Arthritis    back & knees   . Asthma    uses albuterol inhaler if  outside, cold weather & exercising   . Depression   . Dyspnea   . Fatty liver   . GERD (gastroesophageal reflux disease)   . History of hiatal hernia   . History of kidney stones    passed spontaneously  . IBS (irritable bowel syndrome)   . Iron deficiency anemia   . Joint pain   . Lactose intolerance   . Leg edema   . Melanoma ([place])    malignant, left leg,  wide excision, sentinel node, one positive stage 3, as a result- lymphdema, will be following with ONCOLOGY, PET scan- last 2017  . Myasthenia gravis ([place])   . Myasthenia gravis ([place]) 2000  .  Osteoarthritis   . Pneumonia 2015   hosp. [facility] , treated with IV antibiotics   . Sleep apnea    sleep irregular pattern- no CPAP order solidified yet.   [name] Kitchen Spinal stenosis   . Vitamin B deficiency   . Vitamin D deficiency     PAST SURGICAL HISTORY: Past Surgical History:  Procedure Laterality Date  . APPENDECTOMY    . BACK SURGERY    . BREAST SURGERY Bilateral 2004   reduction   . EYE SURGERY Bilateral 1999   corneal peel  . MELANOMA EXCISION Left    removed from left leg  . OOPHORECTOMY Left   . THYMECTOMY  2001  . TOTAL KNEE ARTHROPLASTY Left 01/2016  . WRIST SURGERY Left    plate & 6 screws     SOCIAL HISTORY: Social History   Tobacco Use  . Smoking status: Never Smoker  . Smokeless tobacco: Never Used  Substance Use Topics  . Alcohol use: Yes    Alcohol/week: 1.0 standard drinks    Types: 1 Standard drinks or equivalent per week    Comment: occassionally  . Drug use: No    FAMILY HISTORY: Family History  Problem Relation Age of Onset  . COPD Mother   . Lung cancer Father   . Diverticulitis Father   . Sleep apnea Father   . Colitis Neg Hx     ROS: Review of Systems  Constitutional: Positive for weight loss.  Gastrointestinal: Negative for nausea and vomiting.  Musculoskeletal:       Negative for muscle weakness.  Endo/Heme/Allergies:       Negative for polyphagia.    PHYSICAL EXAM: Blood pressure 123/78, pulse 92, temperature 98.3 F (36.8 C), temperature source Oral, height 5\' 6"  (1.676 m), weight 249 lb (112.9 kg), SpO2 98 %. Body mass index is 40.19 kg/m. Physical Exam  Constitutional: She is oriented to person, place, and time. She appears well-developed and well-nourished.  Cardiovascular: Normal rate.  Pulmonary/Chest: Effort normal.  Musculoskeletal: Normal range of motion.  Neurological: She is oriented to person, place, and time.  Skin: Skin is warm and dry.  Psychiatric: She has a normal mood and affect. Her behavior is  normal.  Vitals reviewed.   RECENT LABS AND TESTS: BMET    Component Value Date/Time   NA 140 10/24/2017 0935   K 4.2 10/24/2017 0935   CL 99 10/24/2017 0935   CO2 25 10/24/2017 0935   GLUCOSE 90 10/24/2017 0935   GLUCOSE 139 (H) 10/09/2017 1604   BUN 17 10/24/2017 0935   CREATININE 0.87 10/24/2017 0935   CREATININE 0.79 06/15/2017 1338   CALCIUM 9.6 10/24/2017 0935   GFRNONAA 74 10/24/2017 0935   GFRNONAA >60 06/15/2017 1338   GFRAA 86 10/24/2017 0935   GFRAA >60 06/15/2017 1338  Lab Results  Component Value Date   HGBA1C 5.5 10/24/2017   HGBA1C 5.8 (H) 04/26/2017   Lab Results  Component Value Date   INSULIN 25.8 (H) 10/24/2017   CBC    Component Value Date/Time   WBC 8.0 10/09/2017 1604   RBC 4.16 10/09/2017 1604   HGB 12.5 10/09/2017 1604   HGB 12.3 08/11/2017 0846   HCT 36.3 10/09/2017 1604   PLT 277.0 10/09/2017 1604   PLT 266 08/11/2017 0846   MCV 87.3 10/09/2017 1604   MCH 27.9 08/11/2017 0846   MCHC 34.5 10/09/2017 1604   RDW 15.0 10/09/2017 1604   LYMPHSABS 1.7 08/11/2017 0846   MONOABS 0.5 08/11/2017 0846   EOSABS 0.3 08/11/2017 0846   BASOSABS 0.0 08/11/2017 0846   Iron/TIBC/Ferritin/ %Sat    Component Value Date/Time   IRON 62 08/11/2017 0847   TIBC 315 08/11/2017 0847   FERRITIN 40.5 10/09/2017 1604   IRONPCTSAT 20 (L) 08/11/2017 0847   Lipid Panel     Component Value Date/Time   CHOL 199 10/24/2017 0935   TRIG 129 10/24/2017 0935   HDL 52 10/24/2017 0935   CHOLHDL 3.9 04/26/2017 1029   VLDL 31 04/26/2017 1029   LDLCALC 121 (H) 10/24/2017 0935   Hepatic Function Panel     Component Value Date/Time   PROT 7.3 10/24/2017 0935   ALBUMIN 4.4 10/24/2017 0935   AST 20 10/24/2017 0935   AST 15 06/15/2017 1338   ALT 18 10/24/2017 0935   ALT 17 06/15/2017 1338   ALKPHOS 122 (H) 10/24/2017 0935   BILITOT 0.5 10/24/2017 0935   BILITOT 0.3 06/15/2017 1338   BILIDIR <0.1 (L) 04/26/2017 1029   IBILI NOT CALCULATED 04/26/2017 1029       Component Value Date/Time   TSH 1.62 10/09/2017 1604   TSH 1.528 04/26/2017 1029   TSH 1.00 03/25/2008 1153   Results for VELLA, COLQUITT (MRN [id number]) as of 11/27/2017 15:00  Ref. Range 10/24/2017 09:35  Vitamin D, 25-Hydroxy Latest Ref Range: 30.0 - 100.0 ng/mL 19.0 (L)   ASSESSMENT AND PLAN: Insulin resistance - Plan: metFORMIN (GLUCOPHAGE) 500 MG tablet  Vitamin D deficiency  At risk for diabetes mellitus  Class 3 severe obesity with serious comorbidity and body mass index (BMI) of 40.0 to 44.9 in adult, unspecified obesity type ([place])  PLAN:  Insulin Resistance [name] will continue to work on weight loss, exercise, and decreasing simple carbohydrates in her diet to help decrease the risk of diabetes. She was informed that eating too many simple carbohydrates or too many calories at one sitting increases the likelihood of GI side effects. [name] is taking metformin IR 500mg  qAM #30 with no refills and prescription was not written today. [name] agreed to follow up with [name] as directed to monitor her progress in 2 weeks.   Diabetes risk counseling [name] was given extended (15 minutes) diabetes prevention counseling today. She is 57 y.o. female and has risk factors for diabetes including insulin resistance and obesity. We discussed intensive lifestyle modifications today with an emphasis on weight loss as well as increasing exercise and decreasing simple carbohydrates in her diet.  Vitamin D Deficiency [name] was informed that low vitamin D levels contributes to fatigue and are associated with obesity, breast, and colon cancer. She agrees to continue to take prescription Vit D @50 ,000 IU every week and will follow up for routine testing of vitamin D, at least 2-3 times per year. She was informed of the risk of over-replacement of vitamin  D and agrees to not increase her dose unless she discusses this with [name] first. [name] agreed to follow up as  directed.  Obesity [name] is currently in the action stage of change. As such, her goal is to continue with weight loss efforts and do more meal planning. She has agreed to follow the Category 3 plan and keeping a food journal with 450 to 550 calories and 90 grams of protein for supper. [name] has been instructed to work up to a goal of 150 minutes of combined cardio and strengthening exercise per week for weight loss and overall health benefits. We discussed the following Briana Modification Strategies today: increasing lean protein intake, increasing vegetables, increase H2O intake and no skipping meals.  [name] has agreed to follow up with our clinic in 2 weeks. She was informed of the importance of frequent follow up visits to maximize her success with intensive lifestyle modifications for her multiple health conditions.   OBESITY Briana INTERVENTION VISIT  Today's visit was # 3   Starting weight: 257 lbs Starting date: 10/24/17 Today's weight : Weight: 249 lb (112.9 kg)  Today's date: 11/23/2017 Total lbs lost to date: 8  ASK: We discussed the diagnosis of obesity with Reyes Ivan today and [name] agreed to give [name] permission to discuss obesity Briana modification therapy today.  ASSESS: [name] has the diagnosis of obesity and her BMI today is 40.21. [name] is in the action stage of change.   ADVISE: [name] was educated on the multiple health risks of obesity as well as the benefit of weight loss to improve her health. She was advised of the need for long term treatment and the importance of lifestyle modifications to improve her current health and to decrease her risk of future health problems.  AGREE: Multiple dietary modification options and treatment options were discussed and [name] agreed to follow the recommendations documented in the above note.  ARRANGE: Shambhavi was educated on the importance of frequent visits to treat obesity as  outlined per CMS and USPSTF guidelines and agreed to schedule her next follow up appointment today.  I, Marcille Blanco, am acting as Location manager for [employer]. Owens Shark, DO  I have reviewed the above documentation for accuracy and completeness, and I agree with the above. -Jearld Lesch, DO

## 2017-12-01 ENCOUNTER — Other Ambulatory Visit (INDEPENDENT_AMBULATORY_CARE_PROVIDER_SITE_OTHER): Payer: Self-pay | Admitting: Family Medicine

## 2017-12-01 DIAGNOSIS — E8881 Metabolic syndrome: Secondary | ICD-10-CM

## 2017-12-05 ENCOUNTER — Encounter: Payer: Self-pay | Admitting: Neurology

## 2017-12-05 ENCOUNTER — Ambulatory Visit (INDEPENDENT_AMBULATORY_CARE_PROVIDER_SITE_OTHER): Payer: BLUE CROSS/BLUE SHIELD | Admitting: Neurology

## 2017-12-05 VITALS — BP 108/69 | HR 104 | Ht 67.0 in | Wt 256.0 lb

## 2017-12-05 DIAGNOSIS — R601 Generalized edema: Secondary | ICD-10-CM

## 2017-12-05 DIAGNOSIS — Z9089 Acquired absence of other organs: Secondary | ICD-10-CM

## 2017-12-05 DIAGNOSIS — G7000 Myasthenia gravis without (acute) exacerbation: Secondary | ICD-10-CM

## 2017-12-05 DIAGNOSIS — G4734 Idiopathic sleep related nonobstructive alveolar hypoventilation: Secondary | ICD-10-CM | POA: Diagnosis not present

## 2017-12-05 DIAGNOSIS — Z6841 Body Mass Index (BMI) 40.0 and over, adult: Secondary | ICD-10-CM

## 2017-12-05 DIAGNOSIS — G4733 Obstructive sleep apnea (adult) (pediatric): Secondary | ICD-10-CM

## 2017-12-05 NOTE — Progress Notes (Signed)
SLEEP MEDICINE CLINIC   Provider:  Larey Seat, M.D.   Primary Care Physician:  Darreld Mclean, MD   Referring Provider: Darreld Mclean, MD and Dr. Marin Olp, MD   Chief Complaint  Patient presents with  . Follow-up    pt alone, rm 11. pt has been seeing a rheumatologist - specialist MD and she has been having swelling all over, spoke to a Rheumatologist who suggested following up with neurologist for her Mysthenia Gravis ( ! ) .  This visit was set to discuss care for her Mysthenia Gravis.  Pt started CPAP 08/30/17. she states that she finds the CPAP hard to use. she feels like she is suffocating. DME [employer]. she states mostly its because she is unable to sleep.     HPI:  [name] is a 57 y.o. female Patient, who had seen me in May and an initial consultation, at the time we discussed her complex medical situation. I had ordered a sleep study and this was performed on 13 August 2017.  It turned into a split-night protocol after the patient was diagnosed with a baseline AHI of 63.4/h, there was no REM sleep noted and therefore no REM exenteration possible.  In supine sleep her AHI was actually a little less high than the nonsupine.  She did not have hypercapnia but she spent 88 minutes total sleep time below 89% oxygen saturation.  For this reason alone she was not a candidate for a dental device.  She was placed on CPAP and titrated to 11 cmH2O, during her sleep study there was no complete resolution of her apnea.  She could not tolerate a nasal pillow nor a large full facemask and finally settled with a smaller full facemask in the lap.  Her sleep efficiency rose to 96.6% of the recorded time.\  The patient was published and air sense 10 AutoSet machine with a minimum pressure of 6 maximum pressure of 11 cmH2O and 1 cm EPR - unfortunately she states that she is not able to tolerate the interface nor the pressure.  For the brief period of time that she use the machine  which is 9 out of the last 30 days and 6 of those days over 4 hours she achieved an AHI of 2.1 so a significant reduction in comparison to baseline.  She does have major air leaks but that does not seem to interfere with the apnea control and she is for 95% of the used CPAP therapy time at a pressure of nine 9.8 cmH2O well covered with in the AutoSet window.  I wonder if she would tolerate a BiPAP better.  But the chief concern is her inability to breath through the nose. I will refer to ENT- nasal patency.  I have never seen Myasthenia Gravis being related to lymphedema / Generalized inflammation. I think she needs to see a local rheumatologist, asking oncologist for a lymphatic specialist. .    HPI: She is seen here on 07-21-2017 in a referral from Dr. Lorelei Pont for transfer of Myasthenia Gravis care- the patient knew me, but may not have realized that I practice sleep medicine . [name]  She first noticed diplopia but also shortness of breath and a general feeling of weakness and exercise intolerance.  She was seronegative by attitude clean receptor blocking antibodies, but was worked up by a specialist in neuromuscular diseases, Dr. Tedra Coupe, at [facility] was  the treating neurologist at the time.  The patient had intermittent problems with immune-supression,she is status post thymectomy, and suffered related infections, bronchitis and pneumonia which in return make the myasthenia exacerbate.    The patient has some Mestinon at home (as needed) but she is not on a daily medication.  She had been immunosuppressed with Imuran, the last one was CellCept, she has also had long-standing steroid treatment up to 80 mg daily which caused a lot of weight gain and unhappiness. She lived in [place] , [place].  Not on immune-supression now- as she was diagnosed with malignant melanoma, stage 3 A, with lymph node metastasis.  Lymphedema in the left leg. The diagnosis was made in [place], [place]- Dr. Duayne Cal. Soon after she moved to [place].  She was followed by Dr.Gioffri ,and Dr. Iran Ouch  in [place]. During her time in [place] her physicians also repeated the serum testing for myasthenia and found again no antibodies.  Her oncologist was of the opinion that she no longer has the disease, however a single-fiber EMG would very likely be positive again. She had genetic testing done for cancer- all returned negative.    When last hospitalized after weight gain on steroids, she was witnessed to be apneic and had low oxygen saturations. She needs an evaluation of Sleep apnea, hypoxia and weight management.   Sleep habits are as follows: bedtime varies- some nights she stays up ( steroids , decongestants) . She feels her brain is racing, she is often stress.  She watches Tv with her husband and often sleeps in from of the TV.  Sleep duration at night may range between 4 and 8 hours.  There are 3-4 nocturias.  Family reports she snores terribly.  The patient has thought about using a wedge or adjustable bed but at this time sleeps on a flat mattress is 2- 3 pillows for head support.  This has made breathing easier.  When she wakes up in the morning she feels very tired, not restored, not refreshed. She sleeps either on her side or on her back she reports.  Father and brother have been snoring and brother has OSA. Sister not affected, son snores.   Social history: remarried- son is a Agricultural consultant, daughter is in grad school. Non smoker, non ETOH - 3 glasses a month or less. Caffeine - dr pepper one a day.    Review of Systems: Out of a complete 14 system review, the patient complains of only the following symptoms, and all other reviewed systems are negative. Panic attacks, anxiety, insomnia, cyclic. Shortness of breath, leg lymphoedema.   Snoring, EDS , unable to comply with CPAP therapy- nasal patency restricted.     Epworth score 14 , Fatigue severity score 36  , depression score n/a   Social History   Socioeconomic History  . Marital status: Married    Spouse name: Junie Bame  . Number of children: 2  . Years of education: Not on file  . Highest education level: Not on file  Occupational History  . Occupation: clinical research  Social Needs  . Financial resource strain: Not on file  . Food insecurity:    Worry: Not on file    Inability: Not on file  . Transportation needs:    Medical: Not on file    Non-medical: Not on file  Tobacco Use  . Smoking status: Never Smoker  . Smokeless tobacco: Never Used  Substance and Sexual Activity  . Alcohol use: Yes  Alcohol/week: 1.0 standard drinks    Types: 1 Standard drinks or equivalent per week    Comment: occassionally  . Drug use: No  . Sexual activity: Yes    Birth control/protection: None  Lifestyle  . Physical activity:    Days per week: Not on file    Minutes per session: Not on file  . Stress: Not on file  Relationships  . Social connections:    Talks on phone: Not on file    Gets together: Not on file    Attends religious service: Not on file    Active member of club or organization: Not on file    Attends meetings of clubs or organizations: Not on file    Relationship status: Not on file  . Intimate partner violence:    Fear of current or ex partner: Not on file    Emotionally abused: Not on file    Physically abused: Not on file    Forced sexual activity: Not on file  Other Topics Concern  . Not on file  Social History Narrative   ** Merged History Encounter **        Family History  Problem Relation Age of Onset  . COPD Mother   . Lung cancer Father   . Diverticulitis Father   . Sleep apnea Father   . Colitis Neg Hx     Past Medical History:  Diagnosis Date  . Abnormal liver function   . Anemia   . Arthritis    back & knees   . Asthma    uses albuterol inhaler if outside, cold weather & exercising    . Depression   . Dyspnea   . Fatty liver   . GERD (gastroesophageal reflux disease)   . History of hiatal hernia   . History of kidney stones    passed spontaneously  . IBS (irritable bowel syndrome)   . Iron deficiency anemia   . Joint pain   . Lactose intolerance   . Leg edema   . Melanoma ([place])    malignant, left leg,  wide excision, sentinel node, one positive stage 3, as a result- lymphdema, will be following with ONCOLOGY, PET scan- last 2017  . Myasthenia gravis ([place])   . Myasthenia gravis ([place]) 2000  . Osteoarthritis   . Pneumonia 2015   hosp. [facility] , treated with IV antibiotics   . Sleep apnea    sleep irregular pattern- no CPAP order solidified yet.   Marland Kitchen Spinal stenosis   . Vitamin B deficiency   . Vitamin D deficiency     Past Surgical History:  Procedure Laterality Date  . APPENDECTOMY    . BACK SURGERY    . BREAST SURGERY Bilateral 2004   reduction   . EYE SURGERY Bilateral 1999   corneal peel  . MELANOMA EXCISION Left    removed from left leg  . OOPHORECTOMY Left   . THYMECTOMY  2001  . TOTAL KNEE ARTHROPLASTY Left 01/2016  . WRIST SURGERY Left    plate & 6 screws     Current Outpatient Medications  Medication Sig Dispense Refill  . albuterol (PROVENTIL HFA;VENTOLIN HFA) 108 (90 Base) MCG/ACT inhaler Inhale 1 puff into the lungs every 6 (six) hours as needed for wheezing or shortness of breath. 1 Inhaler 6  . buPROPion (WELLBUTRIN XL) 150 MG 24 hr tablet Take 1 tablet by mouth daily.    . Cholecalciferol (VITAMIN D3) 50000 units TABS Take 1 tablet by mouth  once a week. 12 tablet 0  . HYDROcodone-acetaminophen (NORCO/VICODIN) 5-325 MG tablet Take 1 tablet by mouth every 6 (six) hours as needed for moderate pain. 60 tablet 0  . ipratropium (ATROVENT) 0.03 % nasal spray Place 2 sprays into both nostrils 4 (four) times daily. 90 mL 3  . metFORMIN (GLUCOPHAGE) 500 MG tablet Take 1 tablet (500 mg total) by mouth daily with breakfast. 30 tablet 0  .  pantoprazole (PROTONIX) 40 MG tablet Take twice daily for 3 months, then daily 60 tablet 11  . pramipexole (MIRAPEX) 1.5 MG tablet TAKE 2 TABLETS (3 MG TOTAL) BY MOUTH 2 (TWO) TIMES DAILY AS NEEDED (FOR RESTLESS LEG SYNDROME 344 tablet 1  . pyridostigmine (MESTINON) 60 MG tablet Take 60 mg by mouth. Take one - two tablets daily    . tiZANidine (ZANAFLEX) 4 MG tablet Take 1 tablet (4 mg total) by mouth every 6 (six) hours as needed for muscle spasms (if unresponsive to cyclobenzaprine). (Patient taking differently: Take 4-6 mg by mouth every 6 (six) hours as needed for muscle spasms (if unresponsive to cyclobenzaprine). ) 30 tablet 0  . triamcinolone cream (KENALOG) 0.1 % Apply 1 application topically 2 (two) times daily. 45 g 0   No current facility-administered medications for this visit.     Allergies as of 12/05/2017 - Review Complete 12/05/2017  Allergen Reaction Noted  . Other Other (See Comments) 12/12/2012  . Tape Other (See Comments) 02/15/2017    Vitals: BP 108/69   Pulse (!) 104   Ht 5\' 7"  (1.702 m)   Wt 256 lb (116.1 kg)   BMI 40.10 kg/m  Last Weight:  Wt Readings from Last 1 Encounters:  12/05/17 256 lb (116.1 kg)   OEV:OJJK mass index is 40.1 kg/m.     Last Height:   Ht Readings from Last 1 Encounters:  12/05/17 5\' 7"  (1.702 m)    Physical exam:  General: The patient is awake, alert and appears not in acute distress. The patient is well groomed. Head: Normocephalic, atraumatic. Neck is supple. Mallampati 3. Pale mucosa.,  neck circumference:16" . Nasal airflow congested,  Retrognathia is not seen. Double chin.  Cardiovascular:  Regular rate and rhythm , without  murmurs or carotid bruit, and without distended neck veins. Respiratory: Lungs are clear to auscultation. Skin:  Without evidence of edema, or rash Trunk: BMI is high. Neurologic exam : The patient is awake and alert, oriented to place and time. Attention span & concentration ability appears normal.    Speech is fluent, without dysarthria, dysphonia or aphasia.  Mood and affect are appropriate.  Cranial nerves: Pupils are equal and briskly reactive to light. Funduscopic exam without  evidence of pallor or edema. Extraocular movements  in vertical and horizontal planes intact and without nystagmus. Visual fields by finger perimetry are intact. Hearing to finger rub intact. Facial sensation intact to fine touch.Facial motor strength is symmetric and tongue and uvula move midline. Shoulder shrug was symmetrical.   Motor exam:  Normal tone, muscle bulk and symmetric strength in all extremities. Sensory:  Fine touch, pinprick and vibration were tested in all extremities. Proprioception tested in the upper extremities was normal.  Coordination: Rapid alternating movements in the fingers/hands was normal. Finger-to-nose maneuver  normal without evidence of ataxia, dysmetria or tremor. Gait and station: Patient walks without assistive device and is able unassisted to climb up to the exam table. Strength within normal limits. Stance is stable and normal.  Turns with  3-4 Steps. Romberg  testing is negative. Deep tendon reflexes: in the  upper and lower extremities are symmetric and intact.   Assessment:  After physical and neurologic examination, review of laboratory studies,  Personal review of imaging studies, reports of other /same  Imaging studies, results of polysomnography and / or neurophysiology testing and pre-existing records as far as provided in visit., my assessment is   1)  Very severe OSA and hypoxemia:  Given BMI, shortness of breath, myasthenia gravis. And large neck and Mallampati contribute , too.  An attended sleep study  confirmed OSA and hypoxemia. Started SPLIT - did well on CPAP was not fully titrated. She used a small FFM in the lab- but is now unable to tolerate. AHI is 2.1 under  10 cm water pressure.   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 Frequent nightmares  had improved under CPAP.      3) Myasthenia gravis, seronegative. Status post thymectomy.  No focal weakness.   4) Melanoma, grade 3.  Worsening Lymphedema- can this be addressed by Oncology- Oedema is not a Neurological problem .   5) RLS- On mirapex but improved under Iron transfusion in a patient with significant iron deficiency and anemia.   6) Rheumatology and ENT evaluation-  Referral done through Dr. Edilia Bo ? Marland Kitchen   The patient was advised of the complex nature of the diagnosed disorder , the treatment options and the  risks for general health and wellness arising from not treating the condition.   I spent more than 35 minutes of face to face time with the patient.  Greater than 50% of time was spent in counseling and coordination of care. We have discussed the diagnosis and differential and I answered the patient's questions.    Plan:  Treatment plan and additional workup : Referral to medical weight management.  In addition , I will check with Dr. Marin Olp about edema ( rheumatology ? ) She had 2 blood transfusions in the interval since last visit. . Needs B12 and D 3 supplements.  Check yearly on Ferritin , TIBC and iron saturation level.    Larey Seat, MD 38/05/6657, 9:35 PM  Certified in Neurology by ABPN Certified in [place] by [facility] [address] [place], [place] [address]

## 2017-12-07 ENCOUNTER — Encounter (INDEPENDENT_AMBULATORY_CARE_PROVIDER_SITE_OTHER): Payer: Self-pay | Admitting: Bariatrics

## 2017-12-07 ENCOUNTER — Ambulatory Visit (INDEPENDENT_AMBULATORY_CARE_PROVIDER_SITE_OTHER): Payer: BLUE CROSS/BLUE SHIELD | Admitting: Bariatrics

## 2017-12-07 VITALS — BP 128/85 | HR 85 | Temp 98.2°F | Ht 66.0 in | Wt 249.0 lb

## 2017-12-07 DIAGNOSIS — Z6841 Body Mass Index (BMI) 40.0 and over, adult: Secondary | ICD-10-CM

## 2017-12-07 DIAGNOSIS — E8881 Metabolic syndrome: Secondary | ICD-10-CM | POA: Diagnosis not present

## 2017-12-07 DIAGNOSIS — F3289 Other specified depressive episodes: Secondary | ICD-10-CM | POA: Diagnosis not present

## 2017-12-07 DIAGNOSIS — Z9189 Other specified personal risk factors, not elsewhere classified: Secondary | ICD-10-CM | POA: Diagnosis not present

## 2017-12-07 MED ORDER — BUPROPION HCL ER (SR) 200 MG PO TB12: 200.0000 mg | ORAL_TABLET | Freq: Every day | ORAL | 0 refills | Status: DC

## 2017-12-11 ENCOUNTER — Telehealth: Payer: Self-pay

## 2017-12-11 DIAGNOSIS — E8881 Metabolic syndrome: Secondary | ICD-10-CM | POA: Insufficient documentation

## 2017-12-11 NOTE — Progress Notes (Signed)
Office: [phone]  /  Fax: [phone]   HPI:   Chief Complaint: OBESITY [name] is here to discuss her progress with her obesity treatment plan. [name] is on the  keep a food journal with 450-550  calories and 90 g of protein   At supper and follow the Category 3 plan and is following her eating plan approximately 60 % of the time. [name] states [name] is exercising 0 minutes 0 times per week. [name] is currently struggling with increase stress and increased work obligations. [name] reports less meal planning and hunger at night. Denies significant cravings.   Her weight is 249 lb (112.9 kg) today and has not lost weight since her last visit. [name] has lost 8 lbs since starting treatment with [name].  Insulin Resistance [name] has a diagnosis of insulin resistance based on her elevated fasting insulin level >5. Although [name]'s blood glucose readings are still under good control, insulin resistance puts her at greater risk of metabolic syndrome and diabetes. [name] is taking metformin currently and continues to work on diet and exercise to decrease risk of diabetes.  Depression with emotional eating behaviors [name] is struggling with emotional eating and using food for comfort to the extent that it is negatively impacting her health. [name] often snacks when [name] is not hungry. [name] sometimes feels [name] is out of control and then feels guilty that [name] made poor food choices. [name] has been working on behavior modification techniques to help reduce her emotional eating and has been somewhat successful. [name] shows no sign of suicidal or homicidal ideations.  Depression screen [facility] 2/9 10/24/2017 05/17/2017  Decreased Interest 3 0  Down, Depressed, Hopeless 2 1  PHQ - 2 Score 5 1  Altered sleeping 3 2  Tired, decreased energy 3 3  Change in appetite 1 1  Feeling bad or failure about yourself  1 1  Trouble concentrating 1 1  Moving slowly or fidgety/restless 0 1  Suicidal thoughts 0 1  PHQ-9 Score 14  11  Difficult doing work/chores Somewhat difficult -   At risk for diabetes [name] is at higher than averagerisk for developing diabetes due to her obesity. [name] currently denies polyuria or polydipsia.   ALLERGIES: Allergies  Allergen Reactions  . Other Other (See Comments)    Cant take some "mycin" drugs   . Tape Other (See Comments)    Blister, irritates skin    MEDICATIONS: Current Outpatient Medications on File Prior to Visit  Medication Sig Dispense Refill  . albuterol (PROVENTIL HFA;VENTOLIN HFA) 108 (90 Base) MCG/ACT inhaler Inhale 1 puff into the lungs every 6 (six) hours as needed for wheezing or shortness of breath. 1 Inhaler 6  . Cholecalciferol (VITAMIN D3) 50000 units TABS Take 1 tablet by mouth once a week. 12 tablet 0  . HYDROcodone-acetaminophen (NORCO/VICODIN) 5-325 MG tablet Take 1 tablet by mouth every 6 (six) hours as needed for moderate pain. 60 tablet 0  . ipratropium (ATROVENT) 0.03 % nasal spray Place 2 sprays into both nostrils 4 (four) times daily. 90 mL 3  . metFORMIN (GLUCOPHAGE) 500 MG tablet Take 1 tablet (500 mg total) by mouth daily with breakfast. 30 tablet 0  . pantoprazole (PROTONIX) 40 MG tablet Take twice daily for 3 months, then daily 60 tablet 11  . pramipexole (MIRAPEX) 1.5 MG tablet TAKE 2 TABLETS (3 MG TOTAL) BY MOUTH 2 (TWO) TIMES DAILY AS NEEDED (FOR RESTLESS LEG SYNDROME 344 tablet 1  . pyridostigmine (MESTINON) 60 MG tablet Take 60  mg by mouth. Take one - two tablets daily    . tiZANidine (ZANAFLEX) 4 MG tablet Take 1 tablet (4 mg total) by mouth every 6 (six) hours as needed for muscle spasms (if unresponsive to cyclobenzaprine). (Patient taking differently: Take 4-6 mg by mouth every 6 (six) hours as needed for muscle spasms (if unresponsive to cyclobenzaprine). ) 30 tablet 0  . triamcinolone cream (KENALOG) 0.1 % Apply 1 application topically 2 (two) times daily. 45 g 0   No current facility-administered medications on file prior to  visit.     PAST MEDICAL HISTORY: Past Medical History:  Diagnosis Date  . Abnormal liver function   . Anemia   . Arthritis    back & knees   . Asthma    uses albuterol inhaler if outside, cold weather & exercising   . Depression   . Dyspnea   . Fatty liver   . GERD (gastroesophageal reflux disease)   . History of hiatal hernia   . History of kidney stones    passed spontaneously  . IBS (irritable bowel syndrome)   . Iron deficiency anemia   . Joint pain   . Lactose intolerance   . Leg edema   . Melanoma ([place])    malignant, left leg,  wide excision, sentinel node, one positive stage 3, as a result- lymphdema, will be following with ONCOLOGY, PET scan- last 2017  . Myasthenia gravis ([place])   . Myasthenia gravis ([place]) 2000  . Osteoarthritis   . Pneumonia 2015   hosp. [facility] , treated with IV antibiotics   . Sleep apnea    sleep irregular pattern- no CPAP order solidified yet.   Marland Kitchen Spinal stenosis   . Vitamin B deficiency   . Vitamin D deficiency     PAST SURGICAL HISTORY: Past Surgical History:  Procedure Laterality Date  . APPENDECTOMY    . BACK SURGERY    . BREAST SURGERY Bilateral 2004   reduction   . EYE SURGERY Bilateral 1999   corneal peel  . MELANOMA EXCISION Left    removed from left leg  . OOPHORECTOMY Left   . THYMECTOMY  2001  . TOTAL KNEE ARTHROPLASTY Left 01/2016  . WRIST SURGERY Left    plate & 6 screws     SOCIAL HISTORY: Social History   Tobacco Use  . Smoking status: Never Smoker  . Smokeless tobacco: Never Used  Substance Use Topics  . Alcohol use: Yes    Alcohol/week: 1.0 standard drinks    Types: 1 Standard drinks or equivalent per week    Comment: occassionally  . Drug use: No    FAMILY HISTORY: Family History  Problem Relation Age of Onset  . COPD Mother   . Lung cancer Father   . Diverticulitis Father   . Sleep apnea Father   . Colitis Neg Hx     ROS: Review of Systems  Constitutional: Negative for weight loss.    Endo/Heme/Allergies: Negative for polydipsia.       Negative for polyuria  Psychiatric/Behavioral: Positive for depression. Negative for suicidal ideas.       Negative for homicidal ideations    PHYSICAL EXAM: Blood pressure 128/85, pulse 85, temperature 98.2 F (36.8 C), temperature source Oral, height 5\' 6"  (1.676 m), weight 249 lb (112.9 kg), SpO2 96 %. Body mass index is 40.19 kg/m. Physical Exam  Constitutional: [name] is oriented to person, place, and time. [name] appears well-developed and well-nourished.  HENT:  Head: Normocephalic.  Neck: Normal range of motion.  Cardiovascular: Normal rate.  Pulmonary/Chest: Effort normal.  Musculoskeletal: Normal range of motion.  Neurological: [name] is alert and oriented to person, place, and time.  Skin: Skin is warm and dry.  Psychiatric: [name] has a normal mood and affect. Her behavior is normal.  Vitals reviewed.   RECENT LABS AND TESTS: BMET    Component Value Date/Time   NA 140 10/24/2017 0935   K 4.2 10/24/2017 0935   CL 99 10/24/2017 0935   CO2 25 10/24/2017 0935   GLUCOSE 90 10/24/2017 0935   GLUCOSE 139 (H) 10/09/2017 1604   BUN 17 10/24/2017 0935   CREATININE 0.87 10/24/2017 0935   CREATININE 0.79 06/15/2017 1338   CALCIUM 9.6 10/24/2017 0935   GFRNONAA 74 10/24/2017 0935   GFRNONAA >60 06/15/2017 1338   GFRAA 86 10/24/2017 0935   GFRAA >60 06/15/2017 1338   Lab Results  Component Value Date   HGBA1C 5.5 10/24/2017   HGBA1C 5.8 (H) 04/26/2017   Lab Results  Component Value Date   INSULIN 25.8 (H) 10/24/2017   CBC    Component Value Date/Time   WBC 8.0 10/09/2017 1604   RBC 4.16 10/09/2017 1604   HGB 12.5 10/09/2017 1604   HGB 12.3 08/11/2017 0846   HCT 36.3 10/09/2017 1604   PLT 277.0 10/09/2017 1604   PLT 266 08/11/2017 0846   MCV 87.3 10/09/2017 1604   MCH 27.9 08/11/2017 0846   MCHC 34.5 10/09/2017 1604   RDW 15.0 10/09/2017 1604   LYMPHSABS 1.7 08/11/2017 0846   MONOABS 0.5 08/11/2017 0846    EOSABS 0.3 08/11/2017 0846   BASOSABS 0.0 08/11/2017 0846   Iron/TIBC/Ferritin/ %Sat    Component Value Date/Time   IRON 62 08/11/2017 0847   TIBC 315 08/11/2017 0847   FERRITIN 40.5 10/09/2017 1604   IRONPCTSAT 20 (L) 08/11/2017 0847   Lipid Panel     Component Value Date/Time   CHOL 199 10/24/2017 0935   TRIG 129 10/24/2017 0935   HDL 52 10/24/2017 0935   CHOLHDL 3.9 04/26/2017 1029   VLDL 31 04/26/2017 1029   LDLCALC 121 (H) 10/24/2017 0935   Hepatic Function Panel     Component Value Date/Time   PROT 7.3 10/24/2017 0935   ALBUMIN 4.4 10/24/2017 0935   AST 20 10/24/2017 0935   AST 15 06/15/2017 1338   ALT 18 10/24/2017 0935   ALT 17 06/15/2017 1338   ALKPHOS 122 (H) 10/24/2017 0935   BILITOT 0.5 10/24/2017 0935   BILITOT 0.3 06/15/2017 1338   BILIDIR <0.1 (L) 04/26/2017 1029   IBILI NOT CALCULATED 04/26/2017 1029      Component Value Date/Time   TSH 1.62 10/09/2017 1604   TSH 1.528 04/26/2017 1029   TSH 1.00 03/25/2008 1153    ASSESSMENT AND PLAN: Insulin resistance  Other depression - with emotional eating - Plan: buPROPion (WELLBUTRIN SR) 200 MG 12 hr tablet  At risk for diabetes mellitus  Class 3 severe obesity with serious comorbidity and body mass index (BMI) of 40.0 to 44.9 in adult, unspecified obesity type ([name])  PLAN: Insulin Resistance Talina will continue to work on weight loss, exercise, and decreasing simple carbohydrates in her diet to help decrease the risk of diabetes. We dicussed metformin including benefits and risks. [name] was informed that eating too many simple carbohydrates or too many calories at one sitting increases the likelihood of GI side effects. [name] agrees to continue Metformin at current dose. [name] agreed to follow up with  [name] as directed to monitor her progress.  Depression with Emotional Eating Behaviors We discussed behavior modification techniques today to help [name] deal with her emotional eating and  depression. [name] has agreed to increas Wellbutrin XL to 200 mg qd #30 with no refills and agreed to follow up as directed.  Diabetes risk counselling [name] was given extended (15 minutes) diabetes prevention counseling today. [name] is 57 y.o. female and has risk factors for diabetes including obesity. We discussed intensive lifestyle modifications today with an emphasis on weight loss as well as increasing exercise and decreasing simple carbohydrates in her diet.  Obesity [name] is currently in the action stage of change. As such, her goal is to continue with weight loss efforts [name] has agreed to keep a food journal with 450-550 calories and 90g of protein total.  Discuss increasing water to 64 ounces, increasing protein, and given [employer] handout.  Arnetra has been instructed to work up to a goal of 150 minutes of combined cardio and strengthening exercise per week for weight loss and overall health benefits. We discussed the following Behavioral Modification Stratagies today: increasing lean protein intake, decreasing simple carbohydrates, increasing vegetables, decrease eating out, no skipping meal, ways to avoid night time snacking, better snacking choices, work on meal planning and easy cooking plans and emotional eating strategies   Jalia has agreed to follow up with our clinic in 2 weeks. [name] was informed of the importance of frequent follow up visits to maximize her success with intensive lifestyle modifications for her multiple health conditions.   OBESITY BEHAVIORAL INTERVENTION VISIT  Today's visit was # 4   Starting weight: 257 lbv Starting date: 10/24/17 Today's weight : 249 lb Today's date: 12/07/17 Total lbs lost to date: 8 lb    ASK: We discussed the diagnosis of obesity with [name] today and [name] agreed to give [name] permission to discuss obesity behavioral modification therapy today.  ASSESS: [name] has the diagnosis of obesity and her BMI  today is 40.21 [name] is in the action stage of change   ADVISE: [name] was educated on the multiple health risks of obesity as well as the benefit of weight loss to improve her health. [name] was advised of the need for long term treatment and the importance of lifestyle modifications to improve her current health and to decrease her risk of future health problems.  AGREE: Multiple dietary modification options and treatment options were discussed and  [name] agreed to follow the recommendations documented in the above note.  ARRANGE: [name] was educated on the importance of frequent visits to treat obesity as outlined per CMS and USPSTF guidelines and agreed to schedule her next follow up appointment today.  Leary Roca, am acting as transcriptionist for [employer], DO   I have reviewed the above documentation for accuracy and completeness, and I agree with the above. -Jearld Lesch, DO

## 2017-12-11 NOTE — Telephone Encounter (Signed)
Per [facility] patient was recently seen by Dr. Brett Fairy for new cpap on 12/05/17. Per Dr. Brett Fairy note she is to follow up at our office at she sees ENT. Since her cpap was already discussed there would be no point for her to come in. Unable to get in contact with the patient. A detailed voicemail was left stating that unless she had some new concerns that her appointment would be cancelled. Office number was provided for call back.

## 2017-12-13 ENCOUNTER — Ambulatory Visit: Payer: Self-pay | Admitting: Nurse Practitioner

## 2017-12-18 ENCOUNTER — Ambulatory Visit (INDEPENDENT_AMBULATORY_CARE_PROVIDER_SITE_OTHER): Payer: Self-pay

## 2017-12-18 ENCOUNTER — Ambulatory Visit (INDEPENDENT_AMBULATORY_CARE_PROVIDER_SITE_OTHER): Payer: BLUE CROSS/BLUE SHIELD | Admitting: Rheumatology

## 2017-12-18 ENCOUNTER — Encounter: Payer: Self-pay | Admitting: Rheumatology

## 2017-12-18 VITALS — BP 132/97 | HR 86 | Resp 14 | Ht 66.0 in | Wt 253.2 lb

## 2017-12-18 DIAGNOSIS — Z96652 Presence of left artificial knee joint: Secondary | ICD-10-CM | POA: Insufficient documentation

## 2017-12-18 DIAGNOSIS — R601 Generalized edema: Secondary | ICD-10-CM

## 2017-12-18 DIAGNOSIS — Z8659 Personal history of other mental and behavioral disorders: Secondary | ICD-10-CM

## 2017-12-18 DIAGNOSIS — G4733 Obstructive sleep apnea (adult) (pediatric): Secondary | ICD-10-CM

## 2017-12-18 DIAGNOSIS — M79672 Pain in left foot: Secondary | ICD-10-CM

## 2017-12-18 DIAGNOSIS — Z8639 Personal history of other endocrine, nutritional and metabolic disease: Secondary | ICD-10-CM

## 2017-12-18 DIAGNOSIS — M47817 Spondylosis without myelopathy or radiculopathy, lumbosacral region: Secondary | ICD-10-CM

## 2017-12-18 DIAGNOSIS — M19041 Primary osteoarthritis, right hand: Secondary | ICD-10-CM | POA: Diagnosis not present

## 2017-12-18 DIAGNOSIS — Z8719 Personal history of other diseases of the digestive system: Secondary | ICD-10-CM

## 2017-12-18 DIAGNOSIS — R5383 Other fatigue: Secondary | ICD-10-CM

## 2017-12-18 DIAGNOSIS — Z8709 Personal history of other diseases of the respiratory system: Secondary | ICD-10-CM

## 2017-12-18 DIAGNOSIS — M1711 Unilateral primary osteoarthritis, right knee: Secondary | ICD-10-CM | POA: Diagnosis not present

## 2017-12-18 DIAGNOSIS — Z8669 Personal history of other diseases of the nervous system and sense organs: Secondary | ICD-10-CM

## 2017-12-18 DIAGNOSIS — R600 Localized edema: Secondary | ICD-10-CM

## 2017-12-18 DIAGNOSIS — M19042 Primary osteoarthritis, left hand: Secondary | ICD-10-CM

## 2017-12-18 DIAGNOSIS — Z862 Personal history of diseases of the blood and blood-forming organs and certain disorders involving the immune mechanism: Secondary | ICD-10-CM

## 2017-12-18 DIAGNOSIS — M79671 Pain in right foot: Secondary | ICD-10-CM | POA: Diagnosis not present

## 2017-12-18 DIAGNOSIS — Z9089 Acquired absence of other organs: Secondary | ICD-10-CM

## 2017-12-18 DIAGNOSIS — E559 Vitamin D deficiency, unspecified: Secondary | ICD-10-CM

## 2017-12-18 DIAGNOSIS — Z85820 Personal history of malignant melanoma of skin: Secondary | ICD-10-CM

## 2017-12-18 DIAGNOSIS — G7000 Myasthenia gravis without (acute) exacerbation: Secondary | ICD-10-CM

## 2017-12-18 DIAGNOSIS — G4734 Idiopathic sleep related nonobstructive alveolar hypoventilation: Secondary | ICD-10-CM

## 2017-12-18 DIAGNOSIS — R739 Hyperglycemia, unspecified: Secondary | ICD-10-CM

## 2017-12-18 NOTE — Patient Instructions (Signed)
Back Exercises The following exercises strengthen the muscles that help to support the back. They also help to keep the lower back flexible. Doing these exercises can help to prevent back pain or lessen existing pain. If you have back pain or discomfort, try doing these exercises 2-3 times each day or as told by your health care provider. When the pain goes away, do them once each day, but increase the number of times that you repeat the steps for each exercise (do more repetitions). If you do not have back pain or discomfort, do these exercises once each day or as told by your health care provider. Exercises Single Knee to Chest  Repeat these steps 3-5 times for each leg: 1. Lie on your back on a firm bed or the floor with your legs extended. 2. Bring one knee to your chest. Your other leg should stay extended and in contact with the floor. 3. Hold your knee in place by grabbing your knee or thigh. 4. Pull on your knee until you feel a gentle stretch in your lower back. 5. Hold the stretch for 10-30 seconds. 6. Slowly release and straighten your leg.  Pelvic Tilt  Repeat these steps 5-10 times: 1. Lie on your back on a firm bed or the floor with your legs extended. 2. Bend your knees so they are pointing toward the ceiling and your feet are flat on the floor. 3. Tighten your lower abdominal muscles to press your lower back against the floor. This motion will tilt your pelvis so your tailbone points up toward the ceiling instead of pointing to your feet or the floor. 4. With gentle tension and even breathing, hold this position for 5-10 seconds.  Cat-Cow  Repeat these steps until your lower back becomes more flexible: 1. Get into a hands-and-knees position on a firm surface. Keep your hands under your shoulders, and keep your knees under your hips. You may place padding under your knees for comfort. 2. Let your head hang down, and point your tailbone toward the floor so your lower back  becomes rounded like the back of a cat. 3. Hold this position for 5 seconds. 4. Slowly lift your head and point your tailbone up toward the ceiling so your back forms a sagging arch like the back of a cow. 5. Hold this position for 5 seconds.  Press-Ups  Repeat these steps 5-10 times: 1. Lie on your abdomen (face-down) on the floor. 2. Place your palms near your head, about shoulder-width apart. 3. While you keep your back as relaxed as possible and keep your hips on the floor, slowly straighten your arms to raise the top half of your body and lift your shoulders. Do not use your back muscles to raise your upper torso. You may adjust the placement of your hands to make yourself more comfortable. 4. Hold this position for 5 seconds while you keep your back relaxed. 5. Slowly return to lying flat on the floor.  Bridges  Repeat these steps 10 times: 1. Lie on your back on a firm surface. 2. Bend your knees so they are pointing toward the ceiling and your feet are flat on the floor. 3. Tighten your buttocks muscles and lift your buttocks off of the floor until your waist is at almost the same height as your knees. You should feel the muscles working in your buttocks and the back of your thighs. If you do not feel these muscles, slide your feet 1-2 inches farther away   from your buttocks. 4. Hold this position for 3-5 seconds. 5. Slowly lower your hips to the starting position, and allow your buttocks muscles to relax completely.  If this exercise is too easy, try doing it with your arms crossed over your chest. Abdominal Crunches  Repeat these steps 5-10 times: 1. Lie on your back on a firm bed or the floor with your legs extended. 2. Bend your knees so they are pointing toward the ceiling and your feet are flat on the floor. 3. Cross your arms over your chest. 4. Tip your chin slightly toward your chest without bending your neck. 5. Tighten your abdominal muscles and slowly raise your  trunk (torso) high enough to lift your shoulder blades a tiny bit off of the floor. Avoid raising your torso higher than that, because it can put too much stress on your low back and it does not help to strengthen your abdominal muscles. 6. Slowly return to your starting position.  Back Lifts Repeat these steps 5-10 times: 1. Lie on your abdomen (face-down) with your arms at your sides, and rest your forehead on the floor. 2. Tighten the muscles in your legs and your buttocks. 3. Slowly lift your chest off of the floor while you keep your hips pressed to the floor. Keep the back of your head in line with the curve in your back. Your eyes should be looking at the floor. 4. Hold this position for 3-5 seconds. 5. Slowly return to your starting position.  Contact a health care provider if:  Your back pain or discomfort gets much worse when you do an exercise.  Your back pain or discomfort does not lessen within 2 hours after you exercise. If you have any of these problems, stop doing these exercises right away. Do not do them again unless your health care provider says that you can. Get help right away if:  You develop sudden, severe back pain. If this happens, stop doing the exercises right away. Do not do them again unless your health care provider says that you can. This information is not intended to replace advice given to you by your health care provider. Make sure you discuss any questions you have with your health care provider. Document Released: 03/24/2004 Document Revised: 06/24/2015 Document Reviewed: 04/10/2014 [employer] Interactive Patient Education  2017 [employer] Knee Exercises Ask your health care provider which exercises are safe for you. Do exercises exactly as told by your health care provider and adjust them as directed. It is normal to feel mild stretching, pulling, tightness, or discomfort as you do these exercises, but you should stop right away if you feel sudden pain  or your pain gets worse.Do not begin these exercises until told by your health care provider. STRETCHING AND RANGE OF MOTION EXERCISES These exercises warm up your muscles and joints and improve the movement and flexibility of your knee. These exercises also help to relieve pain, numbness, and tingling. Exercise A: Knee Extension, Prone 1. Lie on your abdomen on a bed. 2. Place your left / right knee just beyond the edge of the surface so your knee is not on the bed. You can put a towel under your left / right thigh just above your knee for comfort. 3. Relax your leg muscles and allow gravity to straighten your knee. You should feel a stretch behind your left / right knee. 4. Hold this position for __________ seconds. 5. Scoot up so your knee is supported between repetitions. Repeat   __________ times. Complete this stretch __________ times a day. Exercise B: Knee Flexion, Active  1. Lie on your back with both knees straight. If this causes back discomfort, bend your left / right knee so your foot is flat on the floor. 2. Slowly slide your left / right heel back toward your buttocks until you feel a gentle stretch in the front of your knee or thigh. 3. Hold this position for __________ seconds. 4. Slowly slide your left / right heel back to the starting position. Repeat __________ times. Complete this exercise __________ times a day. Exercise C: Quadriceps, Prone  1. Lie on your abdomen on a firm surface, such as a bed or padded floor. 2. Bend your left / right knee and hold your ankle. If you cannot reach your ankle or pant leg, loop a belt around your foot and grab the belt instead. 3. Gently pull your heel toward your buttocks. Your knee should not slide out to the side. You should feel a stretch in the front of your thigh and knee. 4. Hold this position for __________ seconds. Repeat __________ times. Complete this stretch __________ times a day. Exercise D: Hamstring, Supine 1. Lie on  your back. 2. Loop a belt or towel over the ball of your left / right foot. The ball of your foot is on the walking surface, right under your toes. 3. Straighten your left / right knee and slowly pull on the belt to raise your leg until you feel a gentle stretch behind your knee. ? Do not let your left / right knee bend while you do this. ? Keep your other leg flat on the floor. 4. Hold this position for __________ seconds. Repeat __________ times. Complete this stretch __________ times a day. STRENGTHENING EXERCISES These exercises build strength and endurance in your knee. Endurance is the ability to use your muscles for a long time, even after they get tired. Exercise E: Quadriceps, Isometric  1. Lie on your back with your left / right leg extended and your other knee bent. Put a rolled towel or small pillow under your knee if told by your health care provider. 2. Slowly tense the muscles in the front of your left / right thigh. You should see your kneecap slide up toward your hip or see increased dimpling just above the knee. This motion will push the back of the knee toward the floor. 3. For __________ seconds, keep the muscle as tight as you can without increasing your pain. 4. Relax the muscles slowly and completely. Repeat __________ times. Complete this exercise __________ times a day. Exercise F: Straight Leg Raises - Quadriceps 1. Lie on your back with your left / right leg extended and your other knee bent. 2. Tense the muscles in the front of your left / right thigh. You should see your kneecap slide up or see increased dimpling just above the knee. Your thigh may even shake a bit. 3. Keep these muscles tight as you raise your leg 4-6 inches (10-15 cm) off the floor. Do not let your knee bend. 4. Hold this position for __________ seconds. 5. Keep these muscles tense as you lower your leg. 6. Relax your muscles slowly and completely after each repetition. Repeat __________ times.  Complete this exercise __________ times a day. Exercise G: Hamstring, Isometric 1. Lie on your back on a firm surface. 2. Bend your left / right knee approximately __________ degrees. 3. Dig your left / right heel into the surface   as if you are trying to pull it toward your buttocks. Tighten the muscles in the back of your thighs to dig as hard as you can without increasing any pain. 4. Hold this position for __________ seconds. 5. Release the tension gradually and allow your muscles to relax completely for __________ seconds after each repetition. Repeat __________ times. Complete this exercise __________ times a day. Exercise H: Hamstring Curls  If told by your health care provider, do this exercise while wearing ankle weights. Begin with __________ weights. Then increase the weight by 1 lb (0.5 kg) increments. Do not wear ankle weights that are more than __________. 1. Lie on your abdomen with your legs straight. 2. Bend your left / right knee as far as you can without feeling pain. Keep your hips flat against the floor. 3. Hold this position for __________ seconds. 4. Slowly lower your leg to the starting position.  Repeat __________ times. Complete this exercise __________ times a day. Exercise I: Squats (Quadriceps) 1. Stand in front of a table, with your feet and knees pointing straight ahead. You may rest your hands on the table for balance but not for support. 2. Slowly bend your knees and lower your hips like you are going to sit in a chair. ? Keep your weight over your heels, not over your toes. ? Keep your lower legs upright so they are parallel with the table legs. ? Do not let your hips go lower than your knees. ? Do not bend lower than told by your health care provider. ? If your knee pain increases, do not bend as low. 3. Hold the squat position for __________ seconds. 4. Slowly push with your legs to return to standing. Do not use your hands to pull yourself to  standing. Repeat __________ times. Complete this exercise __________ times a day. Exercise J: Wall Slides (Quadriceps)  1. Lean your back against a smooth wall or door while you walk your feet out 18-24 inches (46-61 cm) from it. 2. Place your feet hip-width apart. 3. Slowly slide down the wall or door until your knees bend __________ degrees. Keep your knees over your heels, not over your toes. Keep your knees in line with your hips. 4. Hold for __________ seconds. Repeat __________ times. Complete this exercise __________ times a day. Exercise K: Straight Leg Raises - Hip Abductors 1. Lie on your side with your left / right leg in the top position. Lie so your head, shoulder, knee, and hip line up. You may bend your bottom knee to help you keep your balance. 2. Roll your hips slightly forward so your hips are stacked directly over each other and your left / right knee is facing forward. 3. Leading with your heel, lift your top leg 4-6 inches (10-15 cm). You should feel the muscles in your outer hip lifting. ? Do not let your foot drift forward. ? Do not let your knee roll toward the ceiling. 4. Hold this position for __________ seconds. 5. Slowly return your leg to the starting position. 6. Let your muscles relax completely after each repetition. Repeat __________ times. Complete this exercise __________ times a day. Exercise L: Straight Leg Raises - Hip Extensors 1. Lie on your abdomen on a firm surface. You can put a pillow under your hips if that is more comfortable. 2. Tense the muscles in your buttocks and lift your left / right leg about 4-6 inches (10-15 cm). Keep your knee straight as you lift your leg.   3. Hold this position for __________ seconds. 4. Slowly lower your leg to the starting position. 5. Let your leg relax completely after each repetition. Repeat __________ times. Complete this exercise __________ times a day. This information is not intended to replace advice given  to you by your health care provider. Make sure you discuss any questions you have with your health care provider. Document Released: 12/29/2004 Document Revised: 11/09/2015 Document Reviewed: 12/21/2014 [employer] Interactive Patient Education  2018 [employer]  

## 2017-12-18 NOTE — Progress Notes (Signed)
Office Visit Note  Patient: [name]             Date of Birth: 06-Aug-1960           MRN: [id number]             PCP: Darreld Mclean, MD Referring: Larey Seat, MD Visit Date: 12/18/2017 Occupation: Clinical research-director of operations.  Subjective:  Pain in multiple joints   History of Present Illness: [name] is a 57 y.o. female seen in consultation per request of Dr. Brett Fairy.  According to patient her symptoms started in 2015-2016 after she was diagnosed with melanoma.  She had a melanoma on her left lower extremity which was resected.  She also had lymph node resection the same time.  She states she did well for 1 year and after 1 year she started noticing lymphedema on her left lower extremity.  She also noticed some fullness in her neck.  She went to see a specialist for lymphedema in [place] who diagnosed her with generalized lymphedema.  She has tried weight loss, diuretics and exercise without much results.  She had been seeing Dr. Brett Fairy for myasthenia gravis and sleep apnea for several years.  Patient reports that she has pain in her both knees.  She has been diagnosed with osteoarthritis of the knee joints.  She had left total knee replacement in 2017 and according to her she will need right total knee replacement in the near future.  She has been followed by Dr. Rhona Raider.  She also experiences pain in her bilateral feet, bilateral elbows and her hands.  She has history of chronic spondylosis of lumbar spine.  She had lumbar spine fusion by Dr. Saintclair Halsted.  She also gets epidural injections as needed.  Activities of Daily Living:  Patient reports morning stiffness for 3 hours.   Patient Reports nocturnal pain.  Difficulty dressing/grooming: Denies Difficulty climbing stairs: Reports Difficulty getting out of chair: Reports Difficulty using hands for taps, buttons, cutlery, and/or writing: Reports  Review of Systems  Constitutional: Positive  for fatigue. Negative for night sweats, weight gain and weight loss.  HENT: Negative for mouth sores, trouble swallowing, trouble swallowing, mouth dryness and nose dryness.   Eyes: Positive for dryness. Negative for pain, redness and visual disturbance.  Respiratory: Negative for cough, shortness of breath and difficulty breathing.   Cardiovascular: Negative for chest pain, palpitations, hypertension, irregular heartbeat and swelling in legs/feet.  Gastrointestinal: Negative for blood in stool, constipation and diarrhea.  Endocrine: Negative for increased urination.  Genitourinary: Negative for vaginal dryness.  Musculoskeletal: Positive for arthralgias, joint pain, myalgias, morning stiffness and myalgias. Negative for joint swelling, muscle weakness and muscle tenderness.  Skin: Negative for color change, rash, hair loss, skin tightness, ulcers and sensitivity to sunlight.  Allergic/Immunologic: Negative for susceptible to infections.  Neurological: Negative for dizziness, memory loss, night sweats and weakness.  Hematological: Negative for swollen glands.  Psychiatric/Behavioral: Positive for depressed mood and sleep disturbance. The patient is not nervous/anxious.     PMFS History:  Patient Active Problem List   Diagnosis Date Noted  . Status post total knee replacement, left 12/18/2017  . 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 Vitamin D deficiency 10/24/2017  . Hyperglycemia 10/24/2017  . Other fatigue 10/24/2017  . IDA (iron deficiency anemia) 06/21/2017  . Spondylolisthesis at L3-L4 level 03/01/2017  . Allergy to environmental factors 09/15/2016  . Chronic low back pain 09/15/2016  . Degenerative spondylolisthesis 09/15/2016  . Depression 09/15/2016  .  Leg edema 09/15/2016  . Lumbosacral spondylosis without myelopathy 09/15/2016  . Osteoarthritis of knees, bilateral 09/15/2016  . Pernicious anemia 09/15/2016  . Spinal stenosis, lumbar region with neurogenic claudication 09/15/2016   . Malignant melanoma of thigh, left ([place]) Stage III 11/05/2013  . CERVICAL RADICULOPATHY, LEFT 06/04/2008  . MEDIAL EPICONDYLITIS 06/04/2008  . ANEMIA-NOS 03/25/2008  . ALLERGIC RHINITIS 03/25/2008  . Asthma 03/25/2008  . FATIGUE 03/25/2008  . B12 DEFICIENCY 06/04/2007  . RESTLESS LEG SYNDROME 06/04/2007  . MYASTHENIA GRAVIS WITHOUT EXACERBATION 06/04/2007  . GERD 06/04/2007  . IRRITABLE BOWEL SYNDROME 06/04/2007    Past Medical History:  Diagnosis Date  . Abnormal liver function   . Anemia   . Arthritis    back & knees   . Asthma    uses albuterol inhaler if outside, cold weather & exercising   . Depression   . Dyspnea   . Fatty liver   . GERD (gastroesophageal reflux disease)   . History of hiatal hernia   . History of kidney stones    passed spontaneously  . IBS (irritable bowel syndrome)   . Iron deficiency anemia   . Joint pain   . Lactose intolerance   . Leg edema   . Melanoma ([place])    malignant, left leg,  wide excision, sentinel node, one positive stage 3, as a result- lymphdema, will be following with ONCOLOGY, PET scan- last 2017  . Myasthenia gravis ([place])   . Myasthenia gravis ([place]) 2000  . Osteoarthritis   . Pneumonia 2015   hosp. [facility] , treated with IV antibiotics   . Sleep apnea    sleep irregular pattern- no CPAP order solidified yet.   Marland Kitchen Spinal stenosis   . Vitamin B deficiency   . Vitamin D deficiency     Family History  Problem Relation Age of Onset  . COPD Mother   . Lung cancer Father   . Diverticulitis Father   . Sleep apnea Father   . Colitis Neg Hx    Past Surgical History:  Procedure Laterality Date  . APPENDECTOMY    . BACK SURGERY    . BREAST SURGERY Bilateral 2004   reduction   . EYE SURGERY Bilateral 1999   corneal peel  . MELANOMA EXCISION Left    removed from left leg  . OOPHORECTOMY Left   . THYMECTOMY  2001  . TOTAL KNEE ARTHROPLASTY Left 01/2016  . WRIST SURGERY Left    plate & 6 screws    Social History    Social History Narrative   ** Merged History Encounter **        Objective: Vital Signs: BP (!) 132/97 (BP Location: Right Arm, Patient Position: Sitting, Cuff Size: Large)   Pulse 86   Resp 14   Ht _0  (1.676 m)   Wt 253 lb 3.2 oz (114.9 kg)   BMI 40.87 kg/m    Physical Exam  Constitutional: She is oriented to person, place, and time. She appears well-developed and well-nourished.  HENT:  Head: Normocephalic and atraumatic.  Eyes: Conjunctivae and EOM are normal.  Neck: Normal range of motion.  Cardiovascular: Normal rate, regular rhythm, normal heart sounds and intact distal pulses.  Pulmonary/Chest: Effort normal and breath sounds normal.  Abdominal: Soft. Bowel sounds are normal.  Musculoskeletal: She exhibits edema.  Pedal edema noted more prominent in her left lower extremity  Lymphadenopathy:    She has no cervical adenopathy.  Neurological: She is alert and oriented to person,  place, and time.  Skin: Skin is warm and dry. Capillary refill takes less than 2 seconds.  Psychiatric: She has a normal mood and affect. Her behavior is normal.  Nursing note and vitals reviewed.    Musculoskeletal Exam: C-spine good range of motion.  She has some limitation of range of motion of her lumbar spine.  Shoulder joints elbow joints wrist joints MCPs PIPs were in good range of motion with no synovitis.  She has some thickening of DIP joints consistent with osteoarthritis.  Hip joints were in good range of motion.  Her left total knee replacement appears to be doing well.  Her right knee joint is limited extension.  No warmth swelling or effusion was noted.  She has some DIP and PIP thickening of her toes.  She also has a few calluses on the bottom of her feet.  No synovitis was noted.  CDAI Exam: CDAI Score: Not documented Patient Global Assessment: Not documented; Provider Global Assessment: Not documented Swollen: Not documented; Tender: Not documented Joint Exam   Not  documented   There is currently no information documented on the homunculus. Go to the Rheumatology activity and complete the homunculus joint exam.  Investigation: No additional findings.  Imaging: Xr Foot 2 Views Left  Result Date: 12/18/2017 First MTP, PIP and DIP narrowing was noted.  Some dorsal spurring was noted.  A small calcaneal spur was noted.  No intertarsal joint space narrowing was noted.  No erosive changes were noted. Impresstion: These findings are consistent with osteoarthritis of the foot.  Xr Foot 2 Views Right  Result Date: 12/18/2017 First MTP, PIP and DIP narrowing was noted.  Some dorsal spurring was noted.  A small calcaneal spur was noted.  No intertarsal joint space narrowing was noted.  No erosive changes were noted. Impresstion: These findings are consistent with osteoarthritis of the foot.   Recent Labs: Lab Results  Component Value Date   WBC 8.0 10/09/2017   HGB 12.5 10/09/2017   PLT 277.0 10/09/2017   NA 140 10/24/2017   K 4.2 10/24/2017   CL 99 10/24/2017   CO2 25 10/24/2017   GLUCOSE 90 10/24/2017   BUN 17 10/24/2017   CREATININE 0.87 10/24/2017   BILITOT 0.5 10/24/2017   ALKPHOS 122 (H) 10/24/2017   AST 20 10/24/2017   ALT 18 10/24/2017   PROT 7.3 10/24/2017   ALBUMIN 4.4 10/24/2017   CALCIUM 9.6 10/24/2017   GFRAA 86 10/24/2017  October 09, 2017 hepatitis C negative, ESR 20, RF negative, anti-CCP negative, ANA negative, TSH normal, vitamin D 19  Speciality Comments: No specialty comments available.  Procedures:  No procedures performed Allergies: Other and Tape   Assessment / Plan:     Visit Diagnoses: Primary osteoarthritis of right knee - fu by Dr. Rhona Raider.  Patient reports that she will need right total knee replacement.  Handout on knee exercises was given.  Status post total knee replacement, left - 2017.  Doing well although she does have chronic pain.  Primary osteoarthritis of both hands-she reports history of chronic  pain in her bilateral hands.  Pain in both feet -she complains of severe pain and discomfort in her bilateral feet.  There was no swelling or synovitis on examination.  Clinical findings are consistent with osteoarthritis.  Plan: XR Foot 2 Views Right, XR Foot 2 Views Left, 3 findings were consistent with osteoarthritis of the feet.  Proper fitting shoes with arch support were discussed.  Uric acid, Angiotensin converting enzyme  Lumbosacral spondylosis without myelopathy - s/p fusion by Dr. Saintclair Halsted.  She has chronic pain and she gets intermittent epidural injections.  Myasthenia gravis status post thymectomy (HCC)-she is followed up by Dr. Brett Fairy.  Generalized edema-she has significant pedal edema.  The edema is more prominent in her left lower extremity.  Vitamin D deficiency-she is on vitamin D supplements.  Other fatigue - Plan: CK, Serum protein electrophoresis with reflex  History of melanoma - Left lower extremity 2015   Pedal edema  Chronic intermittent hypoxia with obstructive sleep apnea  Hyperglycemia  History of gastroesophageal reflux (GERD)  History of asthma  History of restless legs syndrome  History of anemia  History of depression  History of obesity    Orders: Orders Placed This Encounter  Procedures  . XR Foot 2 Views Right  . XR Foot 2 Views Left  . CK  . Uric acid  . Angiotensin converting enzyme  . Serum protein electrophoresis with reflex   No orders of the defined types were placed in this encounter.   Face-to-face time spent with patient was 50 minutes. Greater than 50% of time was spent in counseling and coordination of care.  Follow-Up Instructions: Return for Osteoarthritis, edema.   Bo Merino, MD  Note - This record has been created using Editor, commissioning.  Chart creation errors have been sought, but may not always  have been located. Such creation errors do not reflect on  the standard of medical care.

## 2017-12-20 ENCOUNTER — Other Ambulatory Visit: Payer: Self-pay | Admitting: Family Medicine

## 2017-12-20 DIAGNOSIS — R52 Pain, unspecified: Secondary | ICD-10-CM

## 2017-12-20 MED ORDER — HYDROCODONE-ACETAMINOPHEN 5-325 MG PO TABS: 1.0000 | ORAL_TABLET | Freq: Four times a day (QID) | ORAL | 0 refills | Status: DC | PRN

## 2017-12-20 NOTE — Telephone Encounter (Signed)
[place] Last seen here on 9/16  11/07/2017  4   11/07/2017  Hydrocodone-Acetamin 5-325 Mg  60.00 15 Je Cop  [id number]  [place] ([id number])  0/0 20.00 MME Comm Ins  [place]  09/27/2017  4   09/27/2017  Hydrocodone-Acetamin 5-325 Mg  60.00 15 Je Cop  [id number]  [place] ([id number])  0/0 20.00 MME Comm Ins  [place]  08/20/2017  4   08/18/2017  Hydrocodone-Acetamin 5-325 Mg  60.00 15 Je Cop  [id number]  [place] ([id number])  0/0 20.00 MME Comm Ins  [place]  07/12/2017  4   07/12/2017  Hydrocodone-Acetamin 5-325 Mg  60.00 15 Je Cop  [id number]  [place] ([id number])  0/0 20.00 MME Comm Ins  [place]  05/22/2017  4   05/22/2017  Hydrocodone-Chlorphen Er Susp  50.00 5 [place]  [id number]  [place] ([id number])  0/0 20.00 MME Comm Ins  [place]  05/17/2017  4   05/17/2017  Hydrocodone-Acetamin 5-325 Mg  30.00 5 Pa Wri  [id number]  [place] ([id number])  0/0

## 2017-12-20 NOTE — Telephone Encounter (Signed)
Requested medication (s) are due for refill today: Yes  Requested medication (s) are on the active medication list: Yes  Last refill:  11/07/17  Future visit scheduled: No  Notes to clinic:  See request    Requested Prescriptions  Pending Prescriptions Disp Refills   HYDROcodone-acetaminophen (NORCO/VICODIN) 5-325 MG tablet 60 tablet 0    Sig: Take 1 tablet by mouth every 6 (six) hours as needed for moderate pain.     Not Delegated - Analgesics:  Opioid Agonist Combinations Failed - 12/20/2017  2:45 PM      Failed - This refill cannot be delegated      Failed - Urine Drug Screen completed in last 360 days.      Passed - Valid encounter within last 6 months    Recent Outpatient Visits          1 month ago Vitamin D deficiency   [facility], MD   2 months ago [facility], MD   3 months ago Leg edema   Therapist, [facility] at [place], Doretha Sou, MD   5 months ago Leg edema   [facility], Gay Filler, MD   5 years ago Muscle contraction headache   [facility], MD

## 2017-12-20 NOTE — Telephone Encounter (Signed)
Copied from [place] [id number]. Topic: Quick Communication - Rx Refill/Question >> Dec 20, 2017  2:42 PM Alfredia Ferguson R wrote: Medication:HYDROcodone-acetaminophen (NORCO/VICODIN) 5-325 MG tablet  Has the patient contacted their pharmacy? Yes  Preferred Pharmacy (with phone number or street name): [id number] [address], [place] [phone] (Phone) [phone] (Fax)    Agent: Please be advised that RX refills may take up to 3 business days. We ask that you follow-up with your pharmacy.

## 2017-12-21 ENCOUNTER — Other Ambulatory Visit: Payer: Self-pay

## 2017-12-21 ENCOUNTER — Inpatient Hospital Stay: Payer: BLUE CROSS/BLUE SHIELD | Attending: Family | Admitting: Family

## 2017-12-21 ENCOUNTER — Inpatient Hospital Stay: Payer: BLUE CROSS/BLUE SHIELD

## 2017-12-21 ENCOUNTER — Encounter: Payer: Self-pay | Admitting: Family

## 2017-12-21 VITALS — BP 126/64 | HR 81 | Temp 97.9°F | Resp 17 | Wt 252.4 lb

## 2017-12-21 DIAGNOSIS — I890 Lymphedema, not elsewhere classified: Secondary | ICD-10-CM | POA: Diagnosis not present

## 2017-12-21 DIAGNOSIS — D508 Other iron deficiency anemias: Secondary | ICD-10-CM

## 2017-12-21 DIAGNOSIS — D509 Iron deficiency anemia, unspecified: Secondary | ICD-10-CM | POA: Diagnosis not present

## 2017-12-21 DIAGNOSIS — C4372 Malignant melanoma of left lower limb, including hip: Secondary | ICD-10-CM

## 2017-12-21 DIAGNOSIS — Z85820 Personal history of malignant melanoma of skin: Secondary | ICD-10-CM | POA: Diagnosis not present

## 2017-12-21 LAB — CMP (CANCER CENTER ONLY)
ALBUMIN: 3.9 g/dL (ref 3.5–5.0)
ALK PHOS: 110 U/L — AB (ref 26–84)
ALT: 18 U/L (ref 10–47)
AST: 21 U/L (ref 11–38)
Anion gap: 0 — ABNORMAL LOW (ref 5–15)
BILIRUBIN TOTAL: 0.6 mg/dL (ref 0.2–1.6)
BUN: 18 mg/dL (ref 7–22)
CALCIUM: 9.5 mg/dL (ref 8.0–10.3)
CHLORIDE: 107 mmol/L (ref 98–108)
CO2: 30 mmol/L (ref 18–33)
CREATININE: 1 mg/dL (ref 0.60–1.20)
Glucose, Bld: 100 mg/dL (ref 73–118)
Potassium: 4.3 mmol/L (ref 3.3–4.7)
Sodium: 136 mmol/L (ref 128–145)
TOTAL PROTEIN: 7.3 g/dL (ref 6.4–8.1)

## 2017-12-21 LAB — CBC WITH DIFFERENTIAL (CANCER CENTER ONLY)
Abs Immature Granulocytes: 0.02 10*3/uL (ref 0.00–0.07)
BASOS ABS: 0.1 10*3/uL (ref 0.0–0.1)
Basophils Relative: 1 %
EOS PCT: 6 %
Eosinophils Absolute: 0.3 10*3/uL (ref 0.0–0.5)
HEMATOCRIT: 37.9 % (ref 36.0–46.0)
HEMOGLOBIN: 12.1 g/dL (ref 12.0–15.0)
Immature Granulocytes: 0 %
LYMPHS ABS: 1.6 10*3/uL (ref 0.7–4.0)
LYMPHS PCT: 34 %
MCH: 29 pg (ref 26.0–34.0)
MCHC: 31.9 g/dL (ref 30.0–36.0)
MCV: 90.9 fL (ref 80.0–100.0)
MONO ABS: 0.3 10*3/uL (ref 0.1–1.0)
Monocytes Relative: 7 %
NRBC: 0 % (ref 0.0–0.2)
Neutro Abs: 2.4 10*3/uL (ref 1.7–7.7)
Neutrophils Relative %: 52 %
Platelet Count: 292 10*3/uL (ref 150–400)
RBC: 4.17 MIL/uL (ref 3.87–5.11)
RDW: 13.1 % (ref 11.5–15.5)
WBC Count: 4.6 10*3/uL (ref 4.0–10.5)

## 2017-12-21 NOTE — Progress Notes (Signed)
Hematology and Oncology Follow Up Visit  Aura Bibby [id number] 1960/10/16 57 y.o. 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 Worthy is here today for follow-up. She is feeling a bit fatigued. She is not sleeping well and sitting with a 25 yo lady with dementia. This has been stressful for her.  She was able to see a lymphatic specialist and unfortunately they were not able to help her.  She still has lymphedema present in the left side. She is [facility] and has started taking Metformin.  She saw dermatology 3 weeks ago and states that her exam was negative.  She has intermittent blurry vision. She was able to see her eye doctor and her work up was negative. She feels this may be related to medication.  No fever, chills, n/v, cough, rash, dizziness, SOB, chest pain, palpitations, abdominal pain or changes in bowel or bladder habits.  No tenderness in her extremities at this time. She has intermittent tingling in her feet.  No falls or syncopal episodes to report.  She has maintained a good appetite and is staying well hydrated. Her weight is stable.   ECOG Performance Status: 1 - Symptomatic but completely ambulatory  Medications:  Allergies as of 12/21/2017      Reactions   Other Other (See Comments)   Cant take some "mycin" drugs    Tape Other (See Comments)   Blister, irritates skin      Medication List        Accurate as of 12/21/17  2:29 PM. Always use your most recent med list.          albuterol 108 (90 Base) MCG/ACT inhaler Commonly known as:  PROVENTIL HFA;VENTOLIN HFA Inhale 1 puff into the lungs every 6 (six) hours as needed for wheezing or  shortness of breath.   buPROPion 200 MG 12 hr tablet Commonly known as:  WELLBUTRIN SR Take 1 tablet (200 mg total) by mouth daily.   HYDROcodone-acetaminophen 5-325 MG tablet Commonly known as:  NORCO/VICODIN Take 1 tablet by mouth every 6 (six) hours as needed for moderate pain.   ipratropium 0.03 % nasal spray Commonly known as:  ATROVENT Place 2 sprays into both nostrils 4 (four) times daily.   metFORMIN 500 MG tablet Commonly known as:  GLUCOPHAGE Take 1 tablet (500 mg total) by mouth daily with breakfast.   pantoprazole 40 MG tablet Commonly known as:  PROTONIX Take twice daily for 3 months, then daily   pramipexole 1.5 MG tablet Commonly known as:  MIRAPEX TAKE 2 TABLETS (3 MG TOTAL) BY MOUTH 2 (TWO) TIMES DAILY AS NEEDED (FOR RESTLESS LEG SYNDROME   pyridostigmine 60 MG tablet Commonly known as:  MESTINON Take 60 mg by mouth. Take one - two tablets daily   tiZANidine 4 MG tablet Commonly known as:  ZANAFLEX Take 1 tablet (4 mg total) by mouth every 6 (six) hours as needed for muscle spasms (if unresponsive to cyclobenzaprine).   triamcinolone cream 0.1 % Commonly known as:  KENALOG Apply 1 application topically 2 (two) times daily.   Vitamin D3 50000 units Tabs Take 1 tablet by mouth once a week.  Allergies:  Allergies  Allergen Reactions  . Other Other (See Comments)    Cant take some "mycin" drugs   . Tape Other (See Comments)    Blister, irritates skin    Past Medical History, Surgical history, Social history, and Family History were reviewed and updated.  Review of Systems: All other 10 point review of systems is negative.   Physical Exam:  weight is 252 lb 6.4 oz (114.5 kg). Her oral temperature is 97.9 F (36.6 C). Her blood pressure is 126/64 and her pulse is 81. Her respiration is 17 and oxygen saturation is 98%.   Wt Readings from Last 3 Encounters:  12/21/17 252 lb 6.4 oz (114.5 kg)  12/18/17 253 lb 3.2 oz (114.9 kg)  12/07/17  249 lb (112.9 kg)    Ocular: Sclerae unicteric, pupils equal, round and reactive to light Ear-nose-throat: Oropharynx clear, dentition fair Lymphatic: No cervical, supraclavicular or axillary adenopathy Lungs no rales or rhonchi, good excursion bilaterally Heart regular rate and rhythm, no murmur appreciated Abd soft, nontender, positive bowel sounds, no liver or spleen tip palpated on exam, no fluid wave  MSK no focal spinal tenderness, no joint edema Neuro: non-focal, well-oriented, appropriate affect Breasts: Deferred   Lab Results  Component Value Date   WBC 4.6 12/21/2017   HGB 12.1 12/21/2017   HCT 37.9 12/21/2017   MCV 90.9 12/21/2017   PLT 292 12/21/2017   Lab Results  Component Value Date   FERRITIN 40.5 10/09/2017   IRON 62 08/11/2017   TIBC 315 08/11/2017   UIBC 253 08/11/2017   IRONPCTSAT 20 (L) 08/11/2017   Lab Results  Component Value Date   RBC 4.17 12/21/2017   No results found for: KPAFRELGTCHN, LAMBDASER, KAPLAMBRATIO No results found for: Kandis Cocking, IGMSERUM Lab Results  Component Value Date   ALBUMINELP 4.5 12/18/2017   A1GS 0.3 12/18/2017   A2GS 0.8 12/18/2017   BETS 0.6 12/18/2017   BETA2SER 0.6 (H) 12/18/2017   GAMS 1.0 12/18/2017   SPEI  12/18/2017     Comment:     . Electrophoretic studies reveal an isolated elevation  of beta-2 globulins. This pattern is suggestive of  acute inflammation; however, the presence of a  monoclonal protein cannot be ruled out. Consider serum immunofixation to rule out monoclonal protein (if not  already ordered). . . Immunofixation analysis is available if identification of the band(s) is clinically indicated. .      Chemistry      Component Value Date/Time   NA 140 10/24/2017 0935   K 4.2 10/24/2017 0935   CL 99 10/24/2017 0935   CO2 25 10/24/2017 0935   BUN 17 10/24/2017 0935   CREATININE 0.87 10/24/2017 0935   CREATININE 0.79 06/15/2017 1338      Component Value Date/Time   CALCIUM 9.6  10/24/2017 0935   ALKPHOS 122 (H) 10/24/2017 0935   AST 20 10/24/2017 0935   AST 15 06/15/2017 1338   ALT 18 10/24/2017 0935   ALT 17 06/15/2017 1338   BILITOT 0.5 10/24/2017 0935   BILITOT 0.3 06/15/2017 1338       Impression and Plan: Ms. Gundlach is a very pleasant 57 yo caucasian female with history of malignant melnome stage III of the left posterior thigh, one positive lymph node diagnosed in September 2015. She also has history of melanoma of the right calf and left upper arm treated with Moh's surgery. No chemotherapy.  Her biggest issue at this time is the persistent lymphedema of her  left side, arm and leg. Hopefully the weight management clinic will be able to help her. I also suggested she see an endocrinologist for further work-up.   She is followed closely by dermatology and was seen 3 weeks ago.  We will see what her iron studies show and bring her back in for infusion if needed.  We will continue to follow along with her and plan to see her back in another 6 months.  She will contact our office with any questions or concerns.   Laverna Peace, NP 10/24/20192:29 PM

## 2017-12-22 LAB — IRON AND TIBC
Iron: 69 ug/dL (ref 41–142)
Saturation Ratios: 20 % — ABNORMAL LOW (ref 21–57)
TIBC: 346 ug/dL (ref 236–444)
UIBC: 277 ug/dL

## 2017-12-22 LAB — FERRITIN: FERRITIN: 41 ng/mL (ref 11–307)

## 2017-12-25 ENCOUNTER — Ambulatory Visit (INDEPENDENT_AMBULATORY_CARE_PROVIDER_SITE_OTHER): Payer: BLUE CROSS/BLUE SHIELD | Admitting: Bariatrics

## 2017-12-25 VITALS — BP 120/82 | HR 88 | Temp 98.4°F | Ht 67.0 in | Wt 249.0 lb

## 2017-12-25 DIAGNOSIS — E8881 Metabolic syndrome: Secondary | ICD-10-CM

## 2017-12-25 DIAGNOSIS — Z9189 Other specified personal risk factors, not elsewhere classified: Secondary | ICD-10-CM

## 2017-12-25 DIAGNOSIS — Z6841 Body Mass Index (BMI) 40.0 and over, adult: Secondary | ICD-10-CM

## 2017-12-25 DIAGNOSIS — F3289 Other specified depressive episodes: Secondary | ICD-10-CM

## 2017-12-25 DIAGNOSIS — E88819 Insulin resistance, unspecified: Secondary | ICD-10-CM

## 2017-12-25 LAB — PROTEIN ELECTROPHORESIS, SERUM, WITH REFLEX
Albumin ELP: 4.5 g/dL (ref 3.8–4.8)
Alpha 1: 0.3 g/dL (ref 0.2–0.3)
Alpha 2: 0.8 g/dL (ref 0.5–0.9)
BETA 2: 0.6 g/dL — AB (ref 0.2–0.5)
Beta Globulin: 0.6 g/dL (ref 0.4–0.6)
GAMMA GLOBULIN: 1 g/dL (ref 0.8–1.7)
Total Protein: 7.8 g/dL (ref 6.1–8.1)

## 2017-12-25 LAB — CK: Total CK: 56 U/L (ref 29–143)

## 2017-12-25 LAB — URIC ACID: URIC ACID, SERUM: 6.2 mg/dL (ref 2.5–7.0)

## 2017-12-25 LAB — ANGIOTENSIN CONVERTING ENZYME: Angiotensin-Converting Enzyme: 38 U/L (ref 9–67)

## 2017-12-25 LAB — IFE INTERPRETATION

## 2017-12-25 MED ORDER — TOPIRAMATE 25 MG PO TABS: 25.0000 mg | ORAL_TABLET | Freq: Every day | ORAL | 0 refills | Status: DC

## 2017-12-25 NOTE — Progress Notes (Signed)
Please refer to hematology for abnormal IFE

## 2017-12-26 ENCOUNTER — Telehealth: Payer: Self-pay | Admitting: *Deleted

## 2017-12-26 DIAGNOSIS — R778 Other specified abnormalities of plasma proteins: Secondary | ICD-10-CM

## 2017-12-26 NOTE — Telephone Encounter (Signed)
-----   Message from Bo Merino, MD sent at 12/25/2017 12:48 PM EDT ----- Please refer to hematology for abnormal IFE.

## 2017-12-26 NOTE — Progress Notes (Signed)
Office: [phone]  /  Fax: [phone]   HPI:   Chief Complaint: OBESITY [name] is here to discuss her progress with her obesity treatment plan. She is keeping a food journal with 450 to 550 calories and 90 grams of protein and is following her eating plan approximately 85 % of the time. She states she is exercising 0 minutes 0 times per week. [name] has found alternatives to sweets, such as instead of milkshake, she may have a smoothie. She has found whipped peanut butter and apples. Her hunger is controlled.   Her weight is 249 lb (112.9 kg) today and has not lost weight since her last visit. She has lost 8 lbs since starting treatment with [name].  Depression with emotional eating behaviors [name] is struggling with emotional eating and using food for comfort to the extent that it is negatively impacting her health. She increased her Wellbutrin XL to 200mg  daily and she cannot tell much of a difference. She shows no sign of suicidal or homicidal ideations.  Insulin Resistance [name] has a diagnosis of insulin resistance based on her elevated fasting insulin level >5. Although [name]'s blood glucose readings are still under good control, insulin resistance puts her at greater risk of metabolic syndrome and diabetes. Her last A1c was 5.5 and Insulin was 25.8 on 10/24/17. She is taking metformin with no side effects and continues to work on diet and exercise to decrease risk of diabetes.  At risk for diabetes Retina is at higher than average risk for developing diabetes due to her insulin resistance and obesity.   ALLERGIES: Allergies  Allergen Reactions  . Other Other (See Comments)    Cant take some "mycin" drugs   . Tape Other (See Comments)    Blister, irritates skin    MEDICATIONS: Current Outpatient Medications on File Prior to Visit  Medication Sig Dispense Refill  . albuterol (PROVENTIL HFA;VENTOLIN HFA) 108 (90 Base) MCG/ACT inhaler Inhale 1 puff into the lungs  every 6 (six) hours as needed for wheezing or shortness of breath. 1 Inhaler 6  . buPROPion (WELLBUTRIN SR) 200 MG 12 hr tablet Take 1 tablet (200 mg total) by mouth daily. 30 tablet 0  . Cholecalciferol (VITAMIN D3) 50000 units TABS Take 1 tablet by mouth once a week. 12 tablet 0  . HYDROcodone-acetaminophen (NORCO/VICODIN) 5-325 MG tablet Take 1 tablet by mouth every 6 (six) hours as needed for moderate pain. 60 tablet 0  . ipratropium (ATROVENT) 0.03 % nasal spray Place 2 sprays into both nostrils 4 (four) times daily. 90 mL 3  . metFORMIN (GLUCOPHAGE) 500 MG tablet Take 1 tablet (500 mg total) by mouth daily with breakfast. 30 tablet 0  . pantoprazole (PROTONIX) 40 MG tablet Take twice daily for 3 months, then daily 60 tablet 11  . pramipexole (MIRAPEX) 1.5 MG tablet TAKE 2 TABLETS (3 MG TOTAL) BY MOUTH 2 (TWO) TIMES DAILY AS NEEDED (FOR RESTLESS LEG SYNDROME 344 tablet 1  . pyridostigmine (MESTINON) 60 MG tablet Take 60 mg by mouth. Take one - two tablets daily    . tiZANidine (ZANAFLEX) 4 MG tablet Take 1 tablet (4 mg total) by mouth every 6 (six) hours as needed for muscle spasms (if unresponsive to cyclobenzaprine). (Patient taking differently: Take 4-6 mg by mouth every 6 (six) hours as needed for muscle spasms (if unresponsive to cyclobenzaprine). ) 30 tablet 0  . triamcinolone cream (KENALOG) 0.1 % Apply 1 application topically 2 (two) times daily. 45 g 0  No current facility-administered medications on file prior to visit.     PAST MEDICAL HISTORY: Past Medical History:  Diagnosis Date  . Abnormal liver function   . Anemia   . Arthritis    back & knees   . Asthma    uses albuterol inhaler if outside, cold weather & exercising   . Depression   . Dyspnea   . Fatty liver   . GERD (gastroesophageal reflux disease)   . History of hiatal hernia   . History of kidney stones    passed spontaneously  . IBS (irritable bowel syndrome)   . Iron deficiency anemia   . Joint pain   .  Lactose intolerance   . Leg edema   . Melanoma ([place])    malignant, left leg,  wide excision, sentinel node, one positive stage 3, as a result- lymphdema, will be following with ONCOLOGY, PET scan- last 2017  . Myasthenia gravis ([place])   . Myasthenia gravis ([place]) 2000  . Osteoarthritis   . Pneumonia 2015   [facility]. [facility] , treated with IV antibiotics   . Sleep apnea    sleep irregular pattern- no CPAP order solidified yet.   Marland Kitchen Spinal stenosis   . Vitamin B deficiency   . Vitamin D deficiency     PAST SURGICAL HISTORY: Past Surgical History:  Procedure Laterality Date  . APPENDECTOMY    . BACK SURGERY    . BREAST SURGERY Bilateral 2004   reduction   . EYE SURGERY Bilateral 1999   corneal peel  . MELANOMA EXCISION Left    removed from left leg  . OOPHORECTOMY Left   . THYMECTOMY  2001  . TOTAL KNEE ARTHROPLASTY Left 01/2016  . WRIST SURGERY Left    plate & 6 screws     SOCIAL HISTORY: Social History   Tobacco Use  . Smoking status: Never Smoker  . Smokeless tobacco: Never Used  Substance Use Topics  . Alcohol use: Yes    Alcohol/week: 1.0 standard drinks    Types: 1 Standard drinks or equivalent per week    Comment: occassionally  . Drug use: No    FAMILY HISTORY: Family History  Problem Relation Age of Onset  . COPD Mother   . Lung cancer Father   . Diverticulitis Father   . Sleep apnea Father   . Colitis Neg Hx     ROS: Review of Systems  Constitutional: Positive for weight loss.  Psychiatric/Behavioral: Positive for depression. Negative for suicidal ideas.       Negative for homicidal ideations.    PHYSICAL EXAM: Blood pressure 120/82, pulse 88, temperature 98.4 F (36.9 C), temperature source Oral, height 5\' 7"  (1.702 m), weight 249 lb (112.9 kg), SpO2 96 %. Body mass index is 39 kg/m. Physical Exam  Constitutional: She is oriented to person, place, and time. She appears well-developed and well-nourished.  Cardiovascular: Normal rate.    Pulmonary/Chest: Effort normal.  Musculoskeletal: Normal range of motion.  Neurological: She is oriented to person, place, and time.  Skin: Skin is warm and dry.  Psychiatric: She has a normal mood and affect. Her behavior is normal.  Vitals reviewed.   RECENT LABS AND TESTS: BMET    Component Value Date/Time   NA 136 12/21/2017 1126   NA 140 10/24/2017 0935   K 4.3 12/21/2017 1126   CL 107 12/21/2017 1126   CO2 30 12/21/2017 1126   GLUCOSE 100 12/21/2017 1126   BUN 18 12/21/2017 1126  BUN 17 10/24/2017 0935   CREATININE 1.00 12/21/2017 1126   CALCIUM 9.5 12/21/2017 1126   GFRNONAA 74 10/24/2017 0935   GFRNONAA >60 06/15/2017 1338   GFRAA 86 10/24/2017 0935   GFRAA >60 06/15/2017 1338   Lab Results  Component Value Date   HGBA1C 5.5 10/24/2017   HGBA1C 5.8 (H) 04/26/2017   Lab Results  Component Value Date   INSULIN 25.8 (H) 10/24/2017   CBC    Component Value Date/Time   WBC 4.6 12/21/2017 1126   WBC 8.0 10/09/2017 1604   RBC 4.17 12/21/2017 1126   HGB 12.1 12/21/2017 1126   HCT 37.9 12/21/2017 1126   PLT 292 12/21/2017 1126   MCV 90.9 12/21/2017 1126   MCH 29.0 12/21/2017 1126   MCHC 31.9 12/21/2017 1126   RDW 13.1 12/21/2017 1126   LYMPHSABS 1.6 12/21/2017 1126   MONOABS 0.3 12/21/2017 1126   EOSABS 0.3 12/21/2017 1126   BASOSABS 0.1 12/21/2017 1126   Iron/TIBC/Ferritin/ %Sat    Component Value Date/Time   IRON 69 12/21/2017 1126   TIBC 346 12/21/2017 1126   FERRITIN 41 12/21/2017 1126   IRONPCTSAT 20 (L) 12/21/2017 1126   Lipid Panel     Component Value Date/Time   CHOL 199 10/24/2017 0935   TRIG 129 10/24/2017 0935   HDL 52 10/24/2017 0935   CHOLHDL 3.9 04/26/2017 1029   VLDL 31 04/26/2017 1029   LDLCALC 121 (H) 10/24/2017 0935   Hepatic Function Panel     Component Value Date/Time   PROT 7.3 12/21/2017 1126   PROT 7.3 10/24/2017 0935   ALBUMIN 3.9 12/21/2017 1126   ALBUMIN 4.4 10/24/2017 0935   AST 21 12/21/2017 1126   ALT 18  12/21/2017 1126   ALKPHOS 110 (H) 12/21/2017 1126   BILITOT 0.6 12/21/2017 1126   BILIDIR <0.1 (L) 04/26/2017 1029   IBILI NOT CALCULATED 04/26/2017 1029      Component Value Date/Time   TSH 1.62 10/09/2017 1604   TSH 1.528 04/26/2017 1029   TSH 1.00 03/25/2008 1153   Results for AVNEET, ASHMORE (MRN [id number]) as of 12/26/2017 11:50  Ref. Range 10/24/2017 09:35  Vitamin D, 25-Hydroxy Latest Ref Range: 30.0 - 100.0 ng/mL 19.0 (L)   ASSESSMENT AND PLAN: Insulin resistance  Other depression - with emotional eating - Plan: topiramate (TOPAMAX) 25 MG tablet  At risk for diabetes mellitus  Class 3 severe obesity with serious comorbidity and body mass index (BMI) of 40.0 to 44.9 in adult, unspecified obesity type ([place])  PLAN:  Insulin Resistance Kelcy will continue to work on weight loss, exercise, and decreasing simple carbohydrates in her diet to help decrease the risk of diabetes.She was informed that eating too many simple carbohydrates or too many calories at one sitting increases the likelihood of GI side effects. Maila agreed to continue taking metformin and prescription was not written today. Trena agreed to follow up with [name] as directed to monitor her progress.  Diabetes risk counseling Cola was given extended (15 minutes) diabetes prevention counseling today. She is 57 y.o. female and has risk factors for diabetes including insulin resistance and obesity. We discussed intensive lifestyle modifications today with an emphasis on weight loss as well as increasing exercise and decreasing simple carbohydrates in her diet.  Depression with Emotional Eating Behaviors We discussed behavior modification techniques today to help Ruthie deal with her emotional eating and depression. She has agreed to add Topamax 25mg , 1 PO with her evening meal #30 with no refills and  to continue taking Wellbutrin as prescribed. Asta agreed to follow up as directed in 2  weeks.  Obesity Trish is currently in the action stage of change. As such, her goal is to continue with weight loss efforts. She has agreed to follow the Category 3 plan and to keep a food journal 450 to 500 calories and 90 grams of protein for supper. She is going to continue to find creative snacks with calorie and protein goals. Hanya has been instructed to work up to a goal of 150 minutes of combined cardio and strengthening exercise per week for weight loss and overall health benefits. We discussed the following Behavioral Modification Strategies today: increasing lean protein intake, decreasing simple carbohydrates, increasing vegetables, and no skipping meals.  Sueellen has agreed to follow up with our clinic in 2 weeks. She was informed of the importance of frequent follow up visits to maximize her success with intensive lifestyle modifications for her multiple health conditions.   OBESITY BEHAVIORAL INTERVENTION VISIT  Today's visit was # 5   Starting weight: 257 lbs Starting date: 10/24/17 Today's weight : Weight: 249 lb (112.9 kg)  Today's date: 12/25/2017 Total lbs lost to date: 8  ASK: We discussed the diagnosis of obesity with Reyes Ivan today and Silvie agreed to give [name] permission to discuss obesity behavioral modification therapy today.  ASSESS: Doral has the diagnosis of obesity and her BMI today is 38.99. Adrieana is in the action stage of change.   ADVISE: Andreea was educated on the multiple health risks of obesity as well as the benefit of weight loss to improve her health. She was advised of the need for long term treatment and the importance of lifestyle modifications to improve her current health and to decrease her risk of future health problems.  AGREE: Multiple dietary modification options and treatment options were discussed and Jeanine agreed to follow the recommendations documented in the above note.  ARRANGE: Janeliz was  educated on the importance of frequent visits to treat obesity as outlined per CMS and USPSTF guidelines and agreed to schedule her next follow up appointment today.  I, Marcille Blanco, am acting as Location manager for [employer]. Owens Shark, DO  I have reviewed the above documentation for accuracy and completeness, and I agree with the above. -Jearld Lesch, DO

## 2017-12-27 ENCOUNTER — Other Ambulatory Visit: Payer: Self-pay | Admitting: Family

## 2017-12-27 DIAGNOSIS — R778 Other specified abnormalities of plasma proteins: Secondary | ICD-10-CM

## 2017-12-27 DIAGNOSIS — Z6841 Body Mass Index (BMI) 40.0 and over, adult: Secondary | ICD-10-CM

## 2018-01-01 ENCOUNTER — Other Ambulatory Visit: Payer: Self-pay | Admitting: Family Medicine

## 2018-01-01 DIAGNOSIS — R600 Localized edema: Secondary | ICD-10-CM

## 2018-01-02 ENCOUNTER — Ambulatory Visit: Payer: BLUE CROSS/BLUE SHIELD | Admitting: Rheumatology

## 2018-01-04 ENCOUNTER — Ambulatory Visit: Payer: BLUE CROSS/BLUE SHIELD

## 2018-01-04 ENCOUNTER — Other Ambulatory Visit: Payer: BLUE CROSS/BLUE SHIELD

## 2018-01-05 ENCOUNTER — Inpatient Hospital Stay: Payer: BLUE CROSS/BLUE SHIELD | Attending: Family

## 2018-01-05 ENCOUNTER — Other Ambulatory Visit: Payer: Self-pay

## 2018-01-05 ENCOUNTER — Inpatient Hospital Stay: Payer: BLUE CROSS/BLUE SHIELD

## 2018-01-05 ENCOUNTER — Other Ambulatory Visit: Payer: Self-pay | Admitting: *Deleted

## 2018-01-05 VITALS — BP 124/56 | HR 78 | Temp 97.5°F | Resp 18

## 2018-01-05 DIAGNOSIS — D508 Other iron deficiency anemias: Secondary | ICD-10-CM

## 2018-01-05 DIAGNOSIS — D509 Iron deficiency anemia, unspecified: Secondary | ICD-10-CM | POA: Diagnosis not present

## 2018-01-05 DIAGNOSIS — R778 Other specified abnormalities of plasma proteins: Secondary | ICD-10-CM

## 2018-01-05 DIAGNOSIS — C4372 Malignant melanoma of left lower limb, including hip: Secondary | ICD-10-CM

## 2018-01-05 MED ORDER — SODIUM CHLORIDE 0.9 % IV SOLN
Freq: Once | INTRAVENOUS | Status: AC
  Administered 2018-01-05: 09:00:00 via INTRAVENOUS
  Filled 2018-01-05: qty 250

## 2018-01-05 MED ORDER — SODIUM CHLORIDE 0.9 % IV SOLN
510.0000 mg | Freq: Once | INTRAVENOUS | Status: AC
  Administered 2018-01-05: 510 mg via INTRAVENOUS
  Filled 2018-01-05: qty 17

## 2018-01-05 NOTE — Patient Instructions (Signed)

## 2018-01-08 ENCOUNTER — Encounter (INDEPENDENT_AMBULATORY_CARE_PROVIDER_SITE_OTHER): Payer: Self-pay | Admitting: Bariatrics

## 2018-01-08 ENCOUNTER — Ambulatory Visit (INDEPENDENT_AMBULATORY_CARE_PROVIDER_SITE_OTHER): Payer: BLUE CROSS/BLUE SHIELD | Admitting: Bariatrics

## 2018-01-08 VITALS — BP 135/78 | HR 81 | Temp 97.7°F | Ht 67.0 in | Wt 250.0 lb

## 2018-01-08 DIAGNOSIS — Z9189 Other specified personal risk factors, not elsewhere classified: Secondary | ICD-10-CM

## 2018-01-08 DIAGNOSIS — E559 Vitamin D deficiency, unspecified: Secondary | ICD-10-CM | POA: Diagnosis not present

## 2018-01-08 DIAGNOSIS — E8881 Metabolic syndrome: Secondary | ICD-10-CM | POA: Diagnosis not present

## 2018-01-08 DIAGNOSIS — F3289 Other specified depressive episodes: Secondary | ICD-10-CM

## 2018-01-08 DIAGNOSIS — Z6839 Body mass index (BMI) 39.0-39.9, adult: Secondary | ICD-10-CM

## 2018-01-08 MED ORDER — TOPIRAMATE 50 MG PO TABS: 50.0000 mg | ORAL_TABLET | Freq: Every day | ORAL | 0 refills | Status: DC

## 2018-01-10 LAB — IMMUNOFIXATION ELECTROPHORESIS
IgA: 461 mg/dL — ABNORMAL HIGH (ref 87–352)
IgG (Immunoglobin G), Serum: 888 mg/dL (ref 700–1600)
IgM (Immunoglobulin M), Srm: 90 mg/dL (ref 26–217)
Total Protein ELP: 6.9 g/dL (ref 6.0–8.5)

## 2018-01-10 NOTE — Progress Notes (Signed)
Office: [phone]  /  Fax: [phone]   HPI:   Chief Complaint: OBESITY [name] is here to discuss her progress with her obesity treatment plan. She is keeping a food journal with 450 to 500 calories and 90+ grams of protein and is following her eating plan approximately 50 % of the time. She states she is exercising 0 minutes 0 times per week. [name] is having more water weight per the patient. Her body water weight did not show up. She has been more exhausted and she has not been sleeping well.  Her weight is 250 lb (113.4 kg) today and has had a weight gain of 1 pound over a period of 3 weeks since her last visit. She has lost 7 lbs since starting treatment with [name].  Depression with emotional eating behaviors [name] is struggling with emotional eating and using food for comfort to the extent that it is negatively impacting her health. She often snacks when she is not hungry. [name] sometimes feels she is out of control and then feels guilty that she made poor food choices. She is taking bupropion 200mg  and we added Topamax 25mg  at her last visit.  Insulin Resistance [name] has a diagnosis of insulin resistance based on her elevated fasting insulin level >5. Although [name]'s blood glucose readings are still under good control, insulin resistance puts her at greater risk of metabolic syndrome and diabetes. Her Insulin was 25.8 and her Hgb A1c was 5.5 on 10/24/17. She is taking metformin 500mg  currently and continues to work on diet and exercise to decrease risk of diabetes.  Vitamin D deficiency [name] has a diagnosis of vitamin D deficiency. She is currently taking high dose vit D and denies nausea, vomiting, or muscle weakness.  At risk for osteopenia and osteoporosis [name] is at higher risk of osteopenia and osteoporosis due to vitamin D deficiency.   ALLERGIES: Allergies  Allergen Reactions  . Other Other (See Comments)    Cant take some "mycin" drugs   . Tape  Other (See Comments)    Blister, irritates skin    MEDICATIONS: Current Outpatient Medications on File Prior to Visit  Medication Sig Dispense Refill  . albuterol (PROVENTIL HFA;VENTOLIN HFA) 108 (90 Base) MCG/ACT inhaler Inhale 1 puff into the lungs every 6 (six) hours as needed for wheezing or shortness of breath. 1 Inhaler 6  . buPROPion (WELLBUTRIN SR) 200 MG 12 hr tablet Take 1 tablet (200 mg total) by mouth daily. 30 tablet 0  . Cholecalciferol (VITAMIN D3) 50000 units TABS Take 1 tablet by mouth once a week. 12 tablet 0  . hydrochlorothiazide (MICROZIDE) 12.5 MG capsule TAKE 1 CAPSULE (12.5 MG TOTAL) BY MOUTH DAILY. USE AS NEEDED FOR SWELLING 90 capsule 2  . HYDROcodone-acetaminophen (NORCO/VICODIN) 5-325 MG tablet Take 1 tablet by mouth every 6 (six) hours as needed for moderate pain. 60 tablet 0  . ipratropium (ATROVENT) 0.03 % nasal spray Place 2 sprays into both nostrils 4 (four) times daily. 90 mL 3  . metFORMIN (GLUCOPHAGE) 500 MG tablet Take 1 tablet (500 mg total) by mouth daily with breakfast. 30 tablet 0  . pantoprazole (PROTONIX) 40 MG tablet Take twice daily for 3 months, then daily 60 tablet 11  . pramipexole (MIRAPEX) 1.5 MG tablet TAKE 2 TABLETS (3 MG TOTAL) BY MOUTH 2 (TWO) TIMES DAILY AS NEEDED (FOR RESTLESS LEG SYNDROME 344 tablet 1  . pyridostigmine (MESTINON) 60 MG tablet Take 60 mg by mouth. Take one - two tablets daily    .  tiZANidine (ZANAFLEX) 4 MG tablet Take 1 tablet (4 mg total) by mouth every 6 (six) hours as needed for muscle spasms (if unresponsive to cyclobenzaprine). (Patient taking differently: Take 4-6 mg by mouth every 6 (six) hours as needed for muscle spasms (if unresponsive to cyclobenzaprine). ) 30 tablet 0  . triamcinolone cream (KENALOG) 0.1 % Apply 1 application topically 2 (two) times daily. 45 g 0   No current facility-administered medications on file prior to visit.     PAST MEDICAL HISTORY: Past Medical History:  Diagnosis Date  .  Abnormal liver function   . Anemia   . Arthritis    back & knees   . Asthma    uses albuterol inhaler if outside, cold weather & exercising   . Depression   . Dyspnea   . Fatty liver   . GERD (gastroesophageal reflux disease)   . History of hiatal hernia   . History of kidney stones    passed spontaneously  . IBS (irritable bowel syndrome)   . Iron deficiency anemia   . Joint pain   . Lactose intolerance   . Leg edema   . Melanoma ([place])    malignant, left leg,  wide excision, sentinel node, one positive stage 3, as a result- lymphdema, will be following with ONCOLOGY, PET scan- last 2017  . Myasthenia gravis ([place])   . Myasthenia gravis ([place]) 2000  . Osteoarthritis   . Pneumonia 2015   hosp. [facility] , treated with IV antibiotics   . Sleep apnea    sleep irregular pattern- no CPAP order solidified yet.   Marland Kitchen Spinal stenosis   . Vitamin B deficiency   . Vitamin D deficiency     PAST SURGICAL HISTORY: Past Surgical History:  Procedure Laterality Date  . APPENDECTOMY    . BACK SURGERY    . BREAST SURGERY Bilateral 2004   reduction   . EYE SURGERY Bilateral 1999   corneal peel  . MELANOMA EXCISION Left    removed from left leg  . OOPHORECTOMY Left   . THYMECTOMY  2001  . TOTAL KNEE ARTHROPLASTY Left 01/2016  . WRIST SURGERY Left    plate & 6 screws     SOCIAL HISTORY: Social History   Tobacco Use  . Smoking status: Never Smoker  . Smokeless tobacco: Never Used  Substance Use Topics  . Alcohol use: Yes    Alcohol/week: 1.0 standard drinks    Types: 1 Standard drinks or equivalent per week    Comment: occassionally  . Drug use: No    FAMILY HISTORY: Family History  Problem Relation Age of Onset  . COPD Mother   . Lung cancer Father   . Diverticulitis Father   . Sleep apnea Father   . Colitis Neg Hx     ROS: Review of Systems  Constitutional: Negative for weight loss.  Gastrointestinal: Negative for nausea and vomiting.  Musculoskeletal:        Negative for muscle weakness.  Psychiatric/Behavioral: Positive for depression.    PHYSICAL EXAM: Blood pressure 135/78, pulse 81, temperature 97.7 F (36.5 C), temperature source Oral, height 5\' 7"  (1.702 m), weight 250 lb (113.4 kg), SpO2 98 %. Body mass index is 39.16 kg/m. Physical Exam  Constitutional: She is oriented to person, place, and time. She appears well-developed and well-nourished.  Cardiovascular: Normal rate.  Pulmonary/Chest: Effort normal.  Musculoskeletal: Normal range of motion.  Neurological: She is oriented to person, place, and time.  Skin: Skin is warm  and dry.  Psychiatric: She has a normal mood and affect. Her behavior is normal.  Vitals reviewed.   RECENT LABS AND TESTS: BMET    Component Value Date/Time   NA 136 12/21/2017 1126   NA 140 10/24/2017 0935   [name] 4.3 12/21/2017 1126   CL 107 12/21/2017 1126   CO2 30 12/21/2017 1126   GLUCOSE 100 12/21/2017 1126   BUN 18 12/21/2017 1126   BUN 17 10/24/2017 0935   CREATININE 1.00 12/21/2017 1126   CALCIUM 9.5 12/21/2017 1126   GFRNONAA 74 10/24/2017 0935   GFRNONAA >60 06/15/2017 1338   GFRAA 86 10/24/2017 0935   GFRAA >60 06/15/2017 1338   Lab Results  Component Value Date   HGBA1C 5.5 10/24/2017   HGBA1C 5.8 (H) 04/26/2017   Lab Results  Component Value Date   INSULIN 25.8 (H) 10/24/2017   CBC    Component Value Date/Time   WBC 4.6 12/21/2017 1126   WBC 8.0 10/09/2017 1604   RBC 4.17 12/21/2017 1126   HGB 12.1 12/21/2017 1126   HCT 37.9 12/21/2017 1126   PLT 292 12/21/2017 1126   MCV 90.9 12/21/2017 1126   MCH 29.0 12/21/2017 1126   MCHC 31.9 12/21/2017 1126   RDW 13.1 12/21/2017 1126   LYMPHSABS 1.6 12/21/2017 1126   MONOABS 0.3 12/21/2017 1126   EOSABS 0.3 12/21/2017 1126   BASOSABS 0.1 12/21/2017 1126   Iron/TIBC/Ferritin/ %Sat    Component Value Date/Time   IRON 69 12/21/2017 1126   TIBC 346 12/21/2017 1126   FERRITIN 41 12/21/2017 1126   IRONPCTSAT 20 (L) 12/21/2017  1126   Lipid Panel     Component Value Date/Time   CHOL 199 10/24/2017 0935   TRIG 129 10/24/2017 0935   HDL 52 10/24/2017 0935   CHOLHDL 3.9 04/26/2017 1029   VLDL 31 04/26/2017 1029   LDLCALC 121 (H) 10/24/2017 0935   Hepatic Function Panel     Component Value Date/Time   PROT 7.3 12/21/2017 1126   PROT 7.3 10/24/2017 0935   ALBUMIN 3.9 12/21/2017 1126   ALBUMIN 4.4 10/24/2017 0935   AST 21 12/21/2017 1126   ALT 18 12/21/2017 1126   ALKPHOS 110 (H) 12/21/2017 1126   BILITOT 0.6 12/21/2017 1126   BILIDIR <0.1 (L) 04/26/2017 1029   IBILI NOT CALCULATED 04/26/2017 1029      Component Value Date/Time   TSH 1.62 10/09/2017 1604   TSH 1.528 04/26/2017 1029   TSH 1.00 03/25/2008 1153   Results for [name], [name] (MRN [id number]) as of 01/10/2018 06:46  Ref. Range 10/24/2017 09:35  Vitamin D, 25-Hydroxy Latest Ref Range: 30.0 - 100.0 ng/mL 19.0 (L)   ASSESSMENT AND PLAN: Insulin resistance  Vitamin D deficiency  Other depression - with emotional eating - Plan: topiramate (TOPAMAX) 50 MG tablet  At risk for osteoporosis  Class 2 severe obesity with serious comorbidity and body mass index (BMI) of 39.0 to 39.9 in adult, unspecified obesity type ([place])  PLAN:  Insulin Resistance [name] will continue to work on weight loss, exercise, and decreasing simple carbohydrates in her diet to help decrease the risk of diabetes.  She was informed that eating too many simple carbohydrates or too many calories at one sitting increases the likelihood of GI side effects. [name] agreed to continue taking metformin and to decrease carbohydrates in her diet. [name] agreed to follow up with [name] as directed to monitor her progress in 2 weeks.  Vitamin D Deficiency [name] was informed that low vitamin  D levels contributes to fatigue and are associated with obesity, breast, and colon cancer. She agrees to continue to take prescription Vit D @50 ,000 IU every week and will follow up for  routine testing of vitamin D, at least 2-3 times per year. She was informed of the risk of over-replacement of vitamin D and agrees to not increase her dose unless she discusses this with [name] first. [name] agrees to follow up as directed.  At risk for osteopenia and osteoporosis [name] was given extended (15 minutes) osteoporosis prevention counseling today. [name] is at risk for osteopenia and osteoporosis due to her vitamin D deficiency. She was encouraged to take her vitamin D and follow her higher calcium diet and increase strengthening exercise to help strengthen her bones and decrease her risk of osteopenia and osteoporosis.  Depression with Emotional Eating Behaviors We discussed behavior modification techniques today to help [name] deal with her emotional eating and depression. She has agreed to increase Topamax to 50mg , 1 PO qd late in the afternoon around 5:00pm nightly #30 with no refills and she agreed to follow up as directed.  Obesity [name] is currently in the action stage of change. As such, her goal is to continue with weight loss efforts. She has agreed to keep a food journal with 450 to 500 calories and 30 grams of protein for each meal. We discussed cognitive behavioral therapy strategies to decrease stress. Kambryn has been instructed to work up to a goal of 150 minutes of combined cardio and strengthening exercise per week for weight loss and overall health benefits. We discussed the following Behavioral Modification Strategies today: increasing lean protein intake, decreasing simple carbohydrates, increasing vegetables, increase H2O intake, no skipping meals, and work on meal planning and easy cooking plans,  [name] has agreed to follow up with our clinic in 2 weeks. She was informed of the importance of frequent follow up visits to maximize her success with intensive lifestyle modifications for her multiple health conditions.   OBESITY BEHAVIORAL INTERVENTION  VISIT  Today's visit was # 6   Starting weight: 257 lbs Starting date: 10/24/17 Today's weight : Weight: 250 lb (113.4 kg)  Today's date: 01/08/2018 Total lbs lost to date: 7  ASK: We discussed the diagnosis of obesity with Reyes Ivan today and Satoya agreed to give [name] permission to discuss obesity behavioral modification therapy today.  ASSESS: Amani has the diagnosis of obesity and her BMI today is 39.15. Carolan is in the action stage of change.   ADVISE: Caroleen was educated on the multiple health risks of obesity as well as the benefit of weight loss to improve her health. She was advised of the need for long term treatment and the importance of lifestyle modifications to improve her current health and to decrease her risk of future health problems.  AGREE: Multiple dietary modification options and treatment options were discussed and Renalda agreed to follow the recommendations documented in the above note.  ARRANGE: Jenise was educated on the importance of frequent visits to treat obesity as outlined per CMS and USPSTF guidelines and agreed to schedule her next follow up appointment today.  I, Marcille Blanco, am acting as Location manager for [employer]. Owens Shark, DO  I have reviewed the above documentation for accuracy and completeness, and I agree with the above. -Jearld Lesch, DO

## 2018-01-22 ENCOUNTER — Emergency Department (HOSPITAL_COMMUNITY): Payer: BLUE CROSS/BLUE SHIELD

## 2018-01-22 ENCOUNTER — Emergency Department (HOSPITAL_COMMUNITY)
Admission: EM | Admit: 2018-01-22 | Discharge: 2018-01-22 | Disposition: A | Discharge status: Home / Self Care | Payer: BLUE CROSS/BLUE SHIELD | Attending: Emergency Medicine | Admitting: Emergency Medicine

## 2018-01-22 ENCOUNTER — Encounter (HOSPITAL_COMMUNITY): Payer: Self-pay

## 2018-01-22 ENCOUNTER — Encounter (INDEPENDENT_AMBULATORY_CARE_PROVIDER_SITE_OTHER): Payer: Self-pay

## 2018-01-22 ENCOUNTER — Ambulatory Visit (INDEPENDENT_AMBULATORY_CARE_PROVIDER_SITE_OTHER): Payer: BLUE CROSS/BLUE SHIELD | Admitting: Bariatrics

## 2018-01-22 ENCOUNTER — Ambulatory Visit: Payer: Self-pay | Admitting: *Deleted

## 2018-01-22 ENCOUNTER — Other Ambulatory Visit: Payer: Self-pay | Admitting: Family Medicine

## 2018-01-22 DIAGNOSIS — M545 Low back pain: Secondary | ICD-10-CM | POA: Insufficient documentation

## 2018-01-22 DIAGNOSIS — Z7984 Long term (current) use of oral hypoglycemic drugs: Secondary | ICD-10-CM | POA: Insufficient documentation

## 2018-01-22 DIAGNOSIS — J45909 Unspecified asthma, uncomplicated: Secondary | ICD-10-CM | POA: Insufficient documentation

## 2018-01-22 DIAGNOSIS — M25511 Pain in right shoulder: Secondary | ICD-10-CM | POA: Diagnosis present

## 2018-01-22 DIAGNOSIS — M25571 Pain in right ankle and joints of right foot: Secondary | ICD-10-CM | POA: Diagnosis not present

## 2018-01-22 DIAGNOSIS — R52 Pain, unspecified: Secondary | ICD-10-CM

## 2018-01-22 DIAGNOSIS — M25561 Pain in right knee: Secondary | ICD-10-CM | POA: Diagnosis not present

## 2018-01-22 DIAGNOSIS — Z79899 Other long term (current) drug therapy: Secondary | ICD-10-CM | POA: Insufficient documentation

## 2018-01-22 DIAGNOSIS — T07XXXA Unspecified multiple injuries, initial encounter: Secondary | ICD-10-CM

## 2018-01-22 MED ORDER — OXYCODONE-ACETAMINOPHEN 5-325 MG PO TABS: 2.0000 | ORAL_TABLET | ORAL | 0 refills | Status: DC | PRN

## 2018-01-22 MED ORDER — METHOCARBAMOL 500 MG PO TABS: 500.0000 mg | ORAL_TABLET | Freq: Two times a day (BID) | ORAL | 0 refills | Status: DC

## 2018-01-22 NOTE — ED Provider Notes (Signed)
[facility] DEPT Provider Note   CSN: [id number] Arrival date & time: 01/22/18  1636     History   Chief Complaint Chief Complaint  Patient presents with  . Fall  . Knee Pain    HPI Mercadez Heitman is a 57 y.o. female.  This is a 57 year old female here for mechanical fall just prior to arrival.  Patient fell onto her right side of her body and did not have any loss of consciousness.  Complains of sharp right shoulder pain worse with movement.  Denies any distal numbness or tingling.  Also complains of having right knee pain.  Complains of right foot and ankle discomfort.  Also notes lower back discomfort as well but denies any distal numbness or tingling.  No bowel or bladder dysfunction.  All of her pain is dull and worse with movement and better with remaining still.  No treatment used prior to arrival.     Past Medical History:  Diagnosis Date  . Abnormal liver function   . Anemia   . Arthritis    back & knees   . Asthma    uses albuterol inhaler if outside, cold weather & exercising   . Depression   . Dyspnea   . Fatty liver   . GERD (gastroesophageal reflux disease)   . History of hiatal hernia   . History of kidney stones    passed spontaneously  . IBS (irritable bowel syndrome)   . Iron deficiency anemia   . Joint pain   . Lactose intolerance   . Leg edema   . Melanoma ([place])    malignant, left leg,  wide excision, sentinel node, one positive stage 3, as a result- lymphdema, will be following with ONCOLOGY, PET scan- last 2017  . Myasthenia gravis ([place])   . Myasthenia gravis ([place]) 2000  . Osteoarthritis   . Pneumonia 2015   hosp. [facility] , treated with IV antibiotics   . Sleep apnea    sleep irregular pattern- no CPAP order solidified yet.   Marland Kitchen Spinal stenosis   . Vitamin B deficiency   . Vitamin D deficiency     Patient Active Problem List   Diagnosis Date Noted  . Class 3 severe obesity with serious comorbidity  and body mass index (BMI) of 40.0 to 44.9 in adult ([place]) 12/27/2017  . Status post total knee replacement, left 12/18/2017  . 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 Vitamin D deficiency 10/24/2017  . Hyperglycemia 10/24/2017  . Other fatigue 10/24/2017  . IDA (iron deficiency anemia) 06/21/2017  . Spondylolisthesis at L3-L4 level 03/01/2017  . Allergy to environmental factors 09/15/2016  . Chronic low back pain 09/15/2016  . Degenerative spondylolisthesis 09/15/2016  . Depression 09/15/2016  . Leg edema 09/15/2016  . Lumbosacral spondylosis without myelopathy 09/15/2016  . Osteoarthritis of knees, bilateral 09/15/2016  . Pernicious anemia 09/15/2016  . 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 CERVICAL RADICULOPATHY, LEFT 06/04/2008  . MEDIAL EPICONDYLITIS 06/04/2008  . ANEMIA-NOS 03/25/2008  . ALLERGIC RHINITIS 03/25/2008  . Asthma 03/25/2008  . FATIGUE 03/25/2008  . B12 DEFICIENCY 06/04/2007  . RESTLESS LEG SYNDROME 06/04/2007  . MYASTHENIA GRAVIS WITHOUT EXACERBATION 06/04/2007  . GERD 06/04/2007  . IRRITABLE BOWEL SYNDROME 06/04/2007    Past Surgical History:  Procedure Laterality Date  . APPENDECTOMY    . BACK SURGERY    . BREAST SURGERY Bilateral 2004   reduction   .  EYE SURGERY Bilateral 1999   corneal peel  . MELANOMA EXCISION Left    removed from left leg  . OOPHORECTOMY Left   . THYMECTOMY  2001  . TOTAL KNEE ARTHROPLASTY Left 01/2016  . WRIST SURGERY Left    plate & 6 screws      OB History    Gravida  3   Para      Term      Preterm      AB      Living        SAB      TAB      Ectopic      Multiple      Live Births               Home Medications    Prior to Admission medications   Medication Sig Start Date End Date Taking? Authorizing Provider  albuterol (PROVENTIL HFA;VENTOLIN HFA) 108 (90 Base) MCG/ACT inhaler Inhale 1 puff into the  lungs every 6 (six) hours as needed for wheezing or shortness of breath. 05/17/17   Elsie Stain, MD  buPROPion (WELLBUTRIN SR) 200 MG 12 hr tablet Take 1 tablet (200 mg total) by mouth daily. 12/07/17   Jearld Lesch A, DO  Cholecalciferol (VITAMIN D3) 50000 units TABS Take 1 tablet by mouth once a week. 11/13/17   Copland, Gay Filler, MD  hydrochlorothiazide (MICROZIDE) 12.5 MG capsule TAKE 1 CAPSULE (12.5 MG TOTAL) BY MOUTH DAILY. USE AS NEEDED FOR SWELLING 01/01/18   Copland, Gay Filler, MD  HYDROcodone-acetaminophen (NORCO/VICODIN) 5-325 MG tablet Take 1 tablet by mouth every 6 (six) hours as needed for moderate pain. 12/20/17 12/20/18  Copland, Gay Filler, MD  ipratropium (ATROVENT) 0.03 % nasal spray Place 2 sprays into both nostrils 4 (four) times daily. 10/09/17   Copland, Gay Filler, MD  metFORMIN (GLUCOPHAGE) 500 MG tablet Take 1 tablet (500 mg total) by mouth daily with breakfast. 11/23/17   Jearld Lesch A, DO  pantoprazole (PROTONIX) 40 MG tablet Take twice daily for 3 months, then daily 05/18/17   [place], Amy S, PA-C  pramipexole (MIRAPEX) 1.5 MG tablet TAKE 2 TABLETS (3 MG TOTAL) BY MOUTH 2 (TWO) TIMES DAILY AS NEEDED (FOR RESTLESS LEG SYNDROME 09/26/17   [place], Holli Humbles, NP  pyridostigmine (MESTINON) 60 MG tablet Take 60 mg by mouth. Take one - two tablets daily    [provider]  tiZANidine (ZANAFLEX) 4 MG tablet Take 1 tablet (4 mg total) by mouth every 6 (six) hours as needed for muscle spasms (if unresponsive to cyclobenzaprine). Patient taking differently: Take 4-6 mg by mouth every 6 (six) hours as needed for muscle spasms (if unresponsive to cyclobenzaprine).  03/02/17   Meyran, Ocie Cornfield, NP  topiramate (TOPAMAX) 50 MG tablet Take 1 tablet (50 mg total) by mouth daily. 01/08/18   Jearld Lesch A, DO  triamcinolone cream (KENALOG) 0.1 % Apply 1 application topically 2 (two) times daily. 08/23/17   Marletta Lor, MD    Family History Family History  Problem  Relation Age of Onset  . COPD Mother   . Lung cancer Father   . Diverticulitis Father   . Sleep apnea Father   . Colitis Neg Hx     Social History Social History   Tobacco Use  . Smoking status: Never Smoker  . Smokeless tobacco: Never Used  Substance Use Topics  . Alcohol use: Yes    Alcohol/week: 1.0 standard drinks    Types:  1 Standard drinks or equivalent per week    Comment: occassionally  . Drug use: No     Allergies   Other and Tape   Review of Systems Review of Systems  All other systems reviewed and are negative.    Physical Exam Updated Vital Signs BP (!) 151/100 (BP Location: Left Arm)   Pulse 97   Temp 98 F (36.7 C) (Oral)   Resp 17   SpO2 98%   Physical Exam  Constitutional: She is oriented to person, place, and time. She appears well-developed and well-nourished.  Non-toxic appearance. No distress.  HENT:  Head: Normocephalic and atraumatic.  Eyes: Pupils are equal, round, and reactive to light. Conjunctivae, EOM and lids are normal.  Neck: Normal range of motion. Neck supple. No tracheal deviation present. No thyroid mass present.  Cardiovascular: Normal rate, regular rhythm and normal heart sounds. Exam reveals no gallop.  No murmur heard. Pulmonary/Chest: Effort normal and breath sounds normal. No stridor. No respiratory distress. She has no decreased breath sounds. She has no wheezes. She has no rhonchi. She has no rales.  Abdominal: Soft. Normal appearance and bowel sounds are normal. She exhibits no distension. There is no tenderness. There is no rebound and no CVA tenderness.  Musculoskeletal: She exhibits no edema.       Right knee: She exhibits decreased range of motion. She exhibits no swelling. Tenderness found. MCL and LCL tenderness noted.       Back:       Legs:      Feet:  Neurological: She is alert and oriented to person, place, and time. She has normal strength. No cranial nerve deficit or sensory deficit. GCS eye subscore is  4. GCS verbal subscore is 5. GCS motor subscore is 6.  Skin: Skin is warm and dry. No abrasion and no rash noted.  Psychiatric: She has a normal mood and affect. Her speech is normal and behavior is normal.  Nursing note and vitals reviewed.    ED Treatments / Results  Labs (all labs ordered are listed, but only abnormal results are displayed) Labs Reviewed - No data to display  EKG None  Radiology Dg Shoulder Right  Result Date: 01/22/2018 CLINICAL DATA:  Fall onto right shoulder. Right shoulder pain. Initial encounter. EXAM: RIGHT SHOULDER - 2+ VIEW COMPARISON:  None. FINDINGS: There is no evidence of fracture or dislocation. Mild degenerative spurring is seen involving the inferior glenoid rim. A small lucent lesion with central pattern of chondrous calcification is seen in the right humeral head, consistent with low-grade chondrous bone lesion. IMPRESSION: No acute findings. Mild degenerative spurring of the inferior glenoid rim. Low-grade chondrous bone lesion in the humeral head. Electronically Signed   By: Earle Gell M.D.   On: 01/22/2018 19:47   Dg Knee Complete 4 Views Right  Result Date: 01/22/2018 CLINICAL DATA:  Fall onto right knee. Right knee pain. Initial encounter. EXAM: RIGHT KNEE - COMPLETE 4+ VIEW COMPARISON:  None. FINDINGS: No evidence of acute fracture, dislocation, or joint effusion. Two internal fixation screws are seen in the proximal tibia. Tricompartmental osteoarthritis is seen with most severe involvement of the patellofemoral and medial compartments. Mild tibia varus noted. IMPRESSION: No acute findings. Tricompartmental osteoarthritis, most severe in the patellofemoral medial compartments. Electronically Signed   By: Earle Gell M.D.   On: 01/22/2018 19:45    Procedures Procedures (including critical care time)  Medications Ordered in ED Medications - No data to display   Initial Impression /  Assessment and Plan / ED Course  I have reviewed the  triage vital signs and the nursing notes.  Pertinent labs & imaging results that were available during my care of the patient were reviewed by me and considered in my medical decision making (see chart for details).     X-rays of right shoulder, LS spine, right knee, right foot and ankle negative for acute fracture.  Patient placed in knee sleeve and will follow with her doctor as needed  Final Clinical Impressions(s) / ED Diagnoses   Final diagnoses:  None    ED Discharge Orders    None       Lacretia Leigh, MD 01/22/18 2229

## 2018-01-22 NOTE — Telephone Encounter (Signed)
Patient fell today- she was stepping over something this morning and got foot caught- she hurt knee, elbow, shoulder,and head. She is not better with rest and ice.  Reason for Disposition . [1] SEVERE pain AND [2] not improved 2 hours after pain medicine/ice packs    Patient has multiple injuries- knee, shoulder, elbow and head- she is going to ED for evaluation.  Answer Assessment - Initial Assessment Questions 1. MECHANISM: "How did the injury happen?" (e.g., twisting injury, direct blow)      fall 2. ONSET: "When did the injury happen?" (Minutes or hours ago)      This morning- 7:30-8 am 3. LOCATION: "Where is the injury located?"      R- knee 4. APPEARANCE of INJURY: "What does the injury look like?"      Swollen- patient has been icing and elevating 5. SEVERITY: "Can you put weight on that leg?" "Can you walk?"      Yes- not full weight, yes- but painful 6. SIZE: For cuts, bruises, or swelling, ask: "How large is it?" (e.g., inches or centimeters;  entire joint)      Yes- bruising, scrape, minimal swelling- inner knee joint 7. PAIN: "Is there pain?" If so, ask: "How bad is the pain?"    (e.g., Scale 1-10; or mild, moderate, severe)     Yes- 7 8. TETANUS: For any breaks in the skin, ask: "When was the last tetanus booster?"     unsure 9. OTHER SYMPTOMS: "Do you have any other symptoms?"  (e.g., "pop" when knee injured, swelling, locking, buckling)      Feels something is locking or fragmented 10. PREGNANCY: "Is there any chance you are pregnant?" "When was your last menstrual period?"       n/a  Answer Assessment - Initial Assessment Questions 1. MECHANISM: "How did the injury happen?"     fall 2. ONSET: "When did the injury happen?" (Minutes or hours ago)      7:30-8 am 3. APPEARANCE of INJURY: "What does the injury look like?"      Pain in joint- no swelling 4. SEVERITY: "Can you move the shoulder normally?"      No- can not lift arm 5. SIZE: For cuts, bruises, or swelling,  ask: "How large is it?" (e.g., inches or centimeters;  entire joint)      no 6. PAIN: "Is there pain?" If so, ask: "How bad is the pain?"   (e.g., Scale 1-10; or mild, moderate, severe)     Yes- 5-6 7. TETANUS: For any breaks in the skin, ask: "When was the last tetanus booster?"     unknown 8. OTHER SYMPTOMS: "Do you have any other symptoms?" (e.g., loss of sensation)     Earlier had trouble feeling fingers, can straighten elbow 9. PREGNANCY: "Is there any chance you are pregnant?" "When was your last menstrual period?"     n/a  Protocols used: KNEE INJURY-A-AH, SHOULDER INJURY-A-AH

## 2018-01-22 NOTE — ED Triage Notes (Signed)
Patient c/o of right knee pain. Patient tripped over a case and caught her foot. Patient fell on her right knee, right foot, right shoulder, and right side of head. Denies LOC.   7/10 throbbing pain.  A/oX4 Ambulatory in triage.

## 2018-01-22 NOTE — Telephone Encounter (Signed)
Copied from [place] [id number]. Topic: Quick Communication - Rx Refill/Question >> Jan 22, 2018  2:53 PM Selinda Flavin B, [place] wrote: Medication: HYDROcodone-acetaminophen (NORCO/VICODIN) 5-325 MG tablet  Has the patient contacted their pharmacy? Yes.  To contact office (Agent: If no, request that the patient contact the pharmacy for the refill.) (Agent: If yes, when and what did the pharmacy advise?)  Preferred Pharmacy (with phone number or street name): [id number] - [place], [place]: Please be advised that RX refills may take up to 3 business days. We ask that you follow-up with your pharmacy.

## 2018-01-23 MED ORDER — HYDROCODONE-ACETAMINOPHEN 5-325 MG PO TABS: 1.0000 | ORAL_TABLET | Freq: Four times a day (QID) | ORAL | 0 refills | Status: DC | PRN

## 2018-01-23 NOTE — Telephone Encounter (Signed)
Last visit in September  [facility]:  12/20/2017  4   12/20/2017  Hydrocodone-Acetamin 5-325 Mg  60.00 15 Je Cop  [id number]  [place] ([id number])  0/0 20.00 MME Comm Ins  [place]  11/07/2017  4   11/07/2017  Hydrocodone-Acetamin 5-325 Mg  60.00 15 Je Cop  [id number]  [place] ([id number])  0/0 20.00 MME Comm Ins  [place]  09/27/2017  4   09/27/2017  Hydrocodone-Acetamin 5-325 Mg  60.00 15 Je Cop  [id number]  [place] ([id number])  0/0 20.00 MME Comm Ins  [place]  08/20/2017  4   08/18/2017  Hydrocodone-Acetamin 5-325 Mg  60.00 15 Je Cop  [id number]  [place] ([id number])  0/0 20.00 MME Comm Ins  [place]  07/12/2017  4   07/12/2017  Hydrocodone-Acetamin 5-325 Mg  60.00 15 Je Cop  [id number]  [place] ([id number])  0/0 20.00 MME Comm Ins  [place]  05/22/2017  4   05/22/2017  Hydrocodone-Chlorphen Er Susp  50.00 5 [place] Citizen  [id number]  [place] ([id number])  0/0 20.00 MME Comm Ins  [place]  05/17/2017  4   05/17/2017  Hydrocodone-Acetamin 5-325 Mg  30.00 5 Pa Wri  [id number]  [place] ([id number])  0/0 30.00 MME Comm Ins  [place]  04/28/2017  4   04/28/2017  Hydrocodone-Acetamin 5-325 Mg  30.00 7 Ki Mey  [id number]  [place] ([id number])       Ok to refill

## 2018-01-24 ENCOUNTER — Encounter: Payer: Self-pay | Admitting: Allergy

## 2018-01-24 ENCOUNTER — Ambulatory Visit (INDEPENDENT_AMBULATORY_CARE_PROVIDER_SITE_OTHER): Payer: BLUE CROSS/BLUE SHIELD | Admitting: Allergy

## 2018-01-24 VITALS — BP 118/68 | HR 78 | Resp 18 | Ht 65.5 in | Wt 253.6 lb

## 2018-01-24 DIAGNOSIS — J4520 Mild intermittent asthma, uncomplicated: Secondary | ICD-10-CM | POA: Diagnosis not present

## 2018-01-24 DIAGNOSIS — J3089 Other allergic rhinitis: Secondary | ICD-10-CM | POA: Diagnosis not present

## 2018-01-24 MED ORDER — FLUTICASONE PROPIONATE 93 MCG/ACT NA EXHU: 2.0000 | INHALANT_SUSPENSION | Freq: Two times a day (BID) | NASAL | 3 refills | Status: DC

## 2018-01-24 MED ORDER — MONTELUKAST SODIUM 10 MG PO TABS: 10.0000 mg | ORAL_TABLET | Freq: Every day | ORAL | 5 refills | Status: DC

## 2018-01-24 NOTE — Patient Instructions (Addendum)
Other allergic rhinitis Perennial rhinoconjunctivitis symptoms for the past 50+ years.  She used to be on allergy injection as a child with good benefit.  Tried Claritin, Singulair, Atrovent, Flonase, Nasonex with minimal benefit.  Patient was evaluated by ENT as well.  Today's skin testing showed: positive to grass, dust mites, cat and mold.  Discussed environmental control measures.  Start Singulair 10 mg daily.  Start Xhance 2 sprays twice a day.  Demonstrated proper use.  Sample given.  May use over the counter antihistamines such as Zyrtec (cetirizine), Claritin (loratadine), Allegra (fexofenadine), or Xyzal (levocetirizine) daily as needed.  Start allergy immunotherapy. Consent was signed.  Asthma Diagnosed with asthma as a child.  Triggers include allergies, weather changes, exercise and pet exposure.  Symptoms occurring less than once a week.  Uses albuterol and nebulizer as needed with good benefit.  Today's spirometry was normal.  May use albuterol rescue inhaler 2 puffs or nebulizer every 4 to 6 hours as needed for shortness of breath, chest tightness, coughing, and wheezing. May use albuterol rescue inhaler 2 puffs 5 to 15 minutes prior to strenuous physical activities.  Monitor symptoms.  Return in about 2 months (around 03/26/2018).  Control of House Dust Mite Allergen . Dust mite allergens are a common trigger of allergy and asthma symptoms. While they can be found throughout the house, these microscopic creatures thrive in warm, humid environments such as bedding, upholstered furniture and carpeting. . Because so much time is spent in the bedroom, it is essential to reduce mite levels there.  . Encase pillows, mattresses, and box springs in special allergen-proof fabric covers or airtight, zippered plastic covers.  . Bedding should be washed weekly in hot water (130 F) and dried in a hot dryer. Allergen-proof covers are available for comforters and pillows that can't be  regularly washed.  Wendee Copp the allergy-proof covers every few months. Minimize clutter in the bedroom. Keep pets out of the bedroom.  Marland Kitchen Keep humidity less than 50% by using a dehumidifier or air conditioning. You can buy a humidity measuring device called a hygrometer to monitor this.  . If possible, replace carpets with hardwood, linoleum, or washable area rugs. If that's not possible, vacuum frequently with a vacuum that has a HEPA filter. . Remove all upholstered furniture and non-washable window drapes from the bedroom. . Remove all non-washable stuffed toys from the bedroom.  Wash stuffed toys weekly. Reducing Pollen Exposure . Pollen seasons: trees (spring), grass (summer) and ragweed/weeds (fall). Marland Kitchen Keep windows closed in your home and car to lower pollen exposure.  Susa Simmonds air conditioning in the bedroom and throughout the house if possible.  . Avoid going out in dry windy days - especially early morning. . Pollen counts are highest between 5 - 10 AM and on dry, hot and windy days.  . Save outside activities for late afternoon or after a heavy rain, when pollen levels are lower.  . Avoid mowing of grass if you have grass pollen allergy. Marland Kitchen Be aware that pollen can also be transported indoors on people and pets.  . Dry your clothes in an automatic dryer rather than hanging them outside where they might collect pollen.  . Rinse hair and eyes before bedtime.  Pet Allergen Avoidance: . Contrary to popular opinion, there are no "hypoallergenic" breeds of dogs or cats. That is because people are not allergic to an animal's hair, but to an allergen found in the animal's saliva, dander (dead skin flakes) or urine.  Pet allergy symptoms typically occur within minutes. For some people, symptoms can build up and become most severe 8 to 12 hours after contact with the animal. People with severe allergies can experience reactions in public places if dander has been transported on the pet owners'  clothing. Marland Kitchen Keeping an animal outdoors is only a partial solution, since homes with pets in the yard still have higher concentrations of animal allergens. . Before getting a pet, ask your allergist to determine if you are allergic to animals. If your pet is already considered part of your family, try to minimize contact and keep the pet out of the bedroom and other rooms where you spend a great deal of time. . As with dust mites, vacuum carpets often or replace carpet with a hardwood floor, tile or linoleum. . High-efficiency particulate air (HEPA) cleaners can reduce allergen levels over time. . While dander and saliva are the source of cat and dog allergens, urine is the source of allergens from rabbits, hamsters, mice and [place] pigs; so ask a non-allergic family member to clean the animal's cage. . If you have a pet allergy, talk to your allergist about the potential for allergy immunotherapy (allergy shots). This strategy can often provide long-term relief.  Mold Control . Mold and fungi can grow on a variety of surfaces provided certain temperature and moisture conditions exist.  . Outdoor molds grow on plants, decaying vegetation and soil. The major outdoor mold, Alternaria and Cladosporium, are found in very high numbers during hot and dry conditions. Generally, a late summer - fall peak is seen for common outdoor fungal spores. Rain will temporarily lower outdoor mold spore count, but counts rise rapidly when the rainy period ends. . The most important indoor molds are Aspergillus and Penicillium. Dark, humid and poorly ventilated basements are ideal sites for mold growth. The next most common sites of mold growth are the bathroom and the kitchen. Outdoor (Seasonal) Mold Control . Use air conditioning and keep windows closed. . Avoid exposure to decaying vegetation. Marland Kitchen Avoid leaf raking. . Avoid grain handling. . Consider wearing a face mask if working in moldy areas.  Indoor (Perennial)  Mold Control  . Maintain humidity below 50%. . Get rid of mold growth on hard surfaces with water, detergent and, if necessary, 5% bleach (do not mix with other cleaners). Then dry the area completely. If mold covers an area more than 10 square feet, consider hiring an indoor environmental professional. . For clothing, washing with soap and water is best. If moldy items cannot be cleaned and dried, throw them away. . Remove sources e.g. contaminated carpets. . Repair and seal leaking roofs or pipes. Using dehumidifiers in damp basements may be helpful, but empty the water and clean units regularly to prevent mildew from forming. All rooms, especially basements, bathrooms and kitchens, require ventilation and cleaning to deter mold and mildew growth. Avoid carpeting on concrete or damp floors, and storing items in damp areas.

## 2018-01-24 NOTE — Assessment & Plan Note (Addendum)
Perennial rhinoconjunctivitis symptoms for the past 50+ years.  She used to be on allergy injection as a child with good benefit.  Tried Claritin, Singulair, Atrovent, Flonase, Nasonex with minimal benefit.  Patient was evaluated by ENT as well.  Today's skin testing showed: positive to grass, dust mites, cat and mold.  Discussed environmental control measures.  Start Singulair 10 mg daily.  Start Xhance 2 sprays twice a day.  Demonstrated proper use.  Sample given.  May use over the counter antihistamines such as Zyrtec (cetirizine), Claritin (loratadine), Allegra (fexofenadine), or Xyzal (levocetirizine) daily as needed.  Start allergy immunotherapy. Consent was signed.

## 2018-01-24 NOTE — Progress Notes (Signed)
New Patient Note  RE: [name] MRN: [id number] DOB: Mar 24, 1960 Date of Office Visit: 01/24/2018  Referring provider: Thornell Sartorius, MD Primary care provider: Darreld Mclean, MD  Chief Complaint: Nasal Congestion and Wheezing  History of Present Illness: I had the pleasure of seeing [name] for initial evaluation at the [facility] on 01/24/2018. She is a 57 y.o. female, who is referred here by Copland, Gay Filler, MD for the evaluation of nasal congestion and wheezing.  Rhinitis: She reports symptoms of nasal congestion, itchy eyes, rhinorrhea, PND, coughing, itchy ears, sneezing. Symptoms have been going on for 50+ years. The symptoms are present all year around. Other triggers include exposure to pet dander, feathers. Anosmia: no. Headache: yes. She has used Claritin, Singulair, Atrovent, Flonase, nasonex, with minimal improvement in symptoms. Sinus infections: none. Previous work up includes: more than 5 years ago which showed multiple positives per patient report. Patient was on allergy injections as a child for a few years which helped.  Previous ENT evaluation: yes. Previous sinus imaging: none.  Asthma: She reports symptoms of chest tightness, shortness of breath, coughing, wheezing for 50+ years. Current medications include albuterol prn and albuterol +budesonide nebulizer which help. She reports not using aerochamber with asthma inhalers. She tried the following inhalers: Advair, Flovent, Symbicort. Main asthma triggers are allergies, weather changes, exercise, pet exposure. In the last month, frequency of asthma symptoms: <1x/week. Frequency of nocturnal symptoms: <1x/month. Frequency of SABA use: <1x/week. Interference with physical activity: sometimes. Sleep is undisturbed. In the last 12 months, emergency room visits/urgent care visits/doctor office visits or hospitalizations due to asthma: one time. In the last 12 months, oral  steroids courses: one. Lifetime history of hospitalization for asthma: yes 6 times. Prior intubations: no. Asthma was diagnosed as a child. History of pneumonia: yes. She was evaluated by allergist in the past. Smoking exposure: no. Up to date with flu vaccine: no. Up to date with pneumonia vaccine: no. Patient has myasthenia gravis.    Assessment and Plan: [name] is a 57 y.o. female with: Other allergic rhinitis Perennial rhinoconjunctivitis symptoms for the past 50+ years.  She used to be on allergy injection as a child with good benefit.  Tried Claritin, Singulair, Atrovent, Flonase, Nasonex with minimal benefit.  Patient was evaluated by ENT as well.  Today's skin testing showed: positive to grass, dust mites, cat and mold.  Discussed environmental control measures.  Start Singulair 10 mg daily.  Start Xhance 2 sprays twice a day.  Demonstrated proper use.  Sample given.  May use over the counter antihistamines such as Zyrtec (cetirizine), Claritin (loratadine), Allegra (fexofenadine), or Xyzal (levocetirizine) daily as needed.  Start allergy immunotherapy. Consent was signed.  Asthma Diagnosed with asthma as a child.  Triggers include allergies, weather changes, exercise and pet exposure.  Symptoms occurring less than once a week.  Uses albuterol and nebulizer as needed with good benefit.  Today's spirometry was normal.  May use albuterol rescue inhaler 2 puffs or nebulizer every 4 to 6 hours as needed for shortness of breath, chest tightness, coughing, and wheezing. May use albuterol rescue inhaler 2 puffs 5 to 15 minutes prior to strenuous physical activities.  Monitor symptoms.  Return in about 2 months (around 03/26/2018).  Meds ordered this encounter  Medications  . montelukast (SINGULAIR) 10 MG tablet    Sig: Take 1 tablet (10 mg total) by mouth at bedtime.    Dispense:  30 tablet  Refill:  5  . DISCONTD: Fluticasone Propionate (XHANCE) 93 MCG/ACT EXHU    Sig:  Place 2 puffs into the nose 2 (two) times daily.    Dispense:  16 mL    Refill:  3  . Fluticasone Propionate (XHANCE) 93 MCG/ACT EXHU    Sig: Place 2 puffs into the nose 2 (two) times daily.    Dispense:  16 mL    Refill:  3   Other allergy screening: Asthma: yes Rhino conjunctivitis: yes Food allergy: no Medication allergy: no Hymenoptera allergy: no Urticaria: no Eczema:no History of recurrent infections suggestive of immunodeficency: no  Diagnostics: Spirometry:  Tracings reviewed. Her effort: Good reproducible efforts. FVC: 3.40L FEV1: 2.76L, 101% predicted FEV1/FVC ratio: 81% Interpretation: Spirometry consistent with normal pattern.  Please see scanned spirometry results for details.  Skin Testing: Environmental allergy panel. Positive test to: grass, dust mites, cat, and mold. Results discussed with patient/family. Airborne Adult Perc - 01/24/18 1002    Time Antigen Placed  0945    Allergen Manufacturer  Lavella Hammock    Location  Back    Number of Test  59    1. Control-Buffer 50% Glycerol  Negative    2. Control-Histamine 1 mg/ml  3+    3. Albumin saline  Negative    4. [place]  Negative    5. [place]  Negative    6. Johnson  Negative    7. [place] Blue  2+    8. Meadow Fescue  2+    9. Perennial Rye  Negative    10. Sweet Vernal  Negative    11. Timothy  4+    12. Cocklebur  Negative    13. Burweed Marshelder  Negative    14. Ragweed, short  Negative    15. Ragweed, Giant  Negative    16. Plantain,  English  Negative    17. Lamb's Quarters  Negative    18. Sheep Sorrell  Negative    19. Rough Pigweed  Negative    20. Marsh Elder, Rough  Negative    21. Mugwort, Common  Negative    22. Ash mix  Negative    23. Birch mix  Negative    24. Beech American  Negative    25. Box, Elder  Negative    26. Cedar, red  Negative    27. Cottonwood, [place]  Negative    28. Elm mix  Negative    29. Hickory mix  Negative    30. Maple mix  Negative    31. Oak, [place]  mix  Negative    32. Pecan Pollen  Negative    33. Pine mix  Negative    34. Sycamore Eastern  Negative    35. Green Bluff, Black Pollen  Negative    36. Alternaria alternata  Negative    37. Cladosporium Herbarum  Negative    38. Aspergillus mix  Negative    39. Penicillium mix  Negative    40. Bipolaris sorokiniana (Helminthosporium)  Negative    41. Drechslera spicifera (Curvularia)  Negative    42. Mucor plumbeus  Negative    43. Fusarium moniliforme  Negative    44. Aureobasidium pullulans (pullulara)  Negative    45. Rhizopus oryzae  Negative    46. Botrytis cinera  Negative    47. Epicoccum nigrum  Negative    48. Phoma betae  Negative    49. Candida Albicans  Negative    50. Trichophyton mentagrophytes  Negative  51. Mite, D Farinae  5,000 AU/ml  3+    52. Mite, D Pteronyssinus  5,000 AU/ml  4+    53. Cat Hair 10,000 BAU/ml  Negative    54.  Dog Epithelia  Negative    55. Mixed Feathers  Negative    56. Horse Epithelia  Negative    57. Cockroach, German  Negative    58. Mouse  Negative    59. Tobacco Leaf  Negative     Intradermal - 01/24/18 1005    Time Antigen Placed  1005    Allergen Manufacturer  Lavella Hammock    Location  Arm    Number of Test  13    Control  Negative    [place]  Negative    Johnson  2+    Ragweed mix  Negative    Weed mix  Negative    Tree mix  Negative    Mold 1  Negative    Mold 2  Negative    Mold 3  Negative    Mold 4  2+    Cat  4+    Dog  Negative    Cockroach  Negative       Past Medical History: Patient Active Problem List   Diagnosis Date Noted  . Mild intermittent asthma without complication 09/32/6712  . Class 3 severe obesity with serious comorbidity and body mass index (BMI) of 40.0 to 44.9 in adult ([place]) 12/27/2017  . Status post total knee replacement, left 12/18/2017  . 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 Vitamin D deficiency 10/24/2017  . Hyperglycemia 10/24/2017  . Other fatigue 10/24/2017  . IDA (iron deficiency anemia)  06/21/2017  . Spondylolisthesis at L3-L4 level 03/01/2017  . Allergy to environmental factors 09/15/2016  . Chronic low back pain 09/15/2016  . Degenerative spondylolisthesis 09/15/2016  . Depression 09/15/2016  . Leg edema 09/15/2016  . Lumbosacral spondylosis without myelopathy 09/15/2016  . Osteoarthritis of knees, bilateral 09/15/2016  . Pernicious anemia 09/15/2016  . 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 Malignant melanoma of thigh, left ([place]) Stage III 11/05/2013  . CERVICAL RADICULOPATHY, LEFT 06/04/2008  . MEDIAL EPICONDYLITIS 06/04/2008  . ANEMIA-NOS 03/25/2008  . Other allergic rhinitis 03/25/2008  . Asthma 03/25/2008  . FATIGUE 03/25/2008  . B12 DEFICIENCY 06/04/2007  . RESTLESS LEG SYNDROME 06/04/2007  . MYASTHENIA GRAVIS WITHOUT EXACERBATION 06/04/2007  . GERD 06/04/2007  . IRRITABLE BOWEL SYNDROME 06/04/2007   Past Medical History:  Diagnosis Date  . Abnormal liver function   . Anemia   . Arthritis    back & knees   . Asthma    uses albuterol inhaler if outside, cold weather & exercising   . Depression   . Dyspnea   . Fatty liver   . GERD (gastroesophageal reflux disease)   . History of hiatal hernia   . History of kidney stones    passed spontaneously  . IBS (irritable bowel syndrome)   . Iron deficiency anemia   . Joint pain   . Lactose intolerance   . Leg edema   . Melanoma ([place])    malignant, left leg,  wide excision, sentinel node, one positive stage 3, as a result- lymphdema, will be following with ONCOLOGY, PET scan- last 2017  . Myasthenia gravis ([place])   . Myasthenia gravis ([place]) 2000  . Osteoarthritis   . Pneumonia 2015   [facility]osp. [facility] , treated with IV antibiotics   . Recurrent upper respiratory infection (URI)   .  Sleep apnea    sleep irregular pattern- no CPAP order solidified yet.   Marland Kitchen Spinal stenosis   . Vitamin B deficiency   . Vitamin D deficiency    Past Surgical History: Past Surgical  History:  Procedure Laterality Date  . APPENDECTOMY    . BACK SURGERY    . BREAST SURGERY Bilateral 2004   reduction   . EYE SURGERY Bilateral 1999   corneal peel  . MELANOMA EXCISION Left    removed from left leg  . OOPHORECTOMY Left   . THYMECTOMY  2001  . TOTAL KNEE ARTHROPLASTY Left 01/2016  . WRIST SURGERY Left    plate & 6 screws    Medication List:  Current Outpatient Medications  Medication Sig Dispense Refill  . albuterol (PROVENTIL HFA;VENTOLIN HFA) 108 (90 Base) MCG/ACT inhaler Inhale 1 puff into the lungs every 6 (six) hours as needed for wheezing or shortness of breath. 1 Inhaler 6  . buPROPion (WELLBUTRIN SR) 200 MG 12 hr tablet Take 1 tablet (200 mg total) by mouth daily. 30 tablet 0  . Cholecalciferol (VITAMIN D3) 50000 units TABS Take 1 tablet by mouth once a week. 12 tablet 0  . HYDROcodone-acetaminophen (NORCO/VICODIN) 5-325 MG tablet Take 1 tablet by mouth every 6 (six) hours as needed for moderate pain. 60 tablet 0  . ipratropium (ATROVENT) 0.03 % nasal spray Place 2 sprays into both nostrils 4 (four) times daily. 90 mL 3  . methocarbamol (ROBAXIN) 500 MG tablet Take 1 tablet (500 mg total) by mouth 2 (two) times daily. 20 tablet 0  . oxyCODONE-acetaminophen (PERCOCET/ROXICET) 5-325 MG tablet Take 2 tablets by mouth every 4 (four) hours as needed for severe pain. 10 tablet 0  . pantoprazole (PROTONIX) 40 MG tablet Take twice daily for 3 months, then daily 60 tablet 11  . pramipexole (MIRAPEX) 1.5 MG tablet TAKE 2 TABLETS (3 MG TOTAL) BY MOUTH 2 (TWO) TIMES DAILY AS NEEDED (FOR RESTLESS LEG SYNDROME 344 tablet 1  . topiramate (TOPAMAX) 50 MG tablet Take 1 tablet (50 mg total) by mouth daily. 30 tablet 0  . triamcinolone cream (KENALOG) 0.1 % Apply 1 application topically 2 (two) times daily. 45 g 0  . Fluticasone Propionate (XHANCE) 93 MCG/ACT EXHU Place 2 puffs into the nose 2 (two) times daily. 16 mL 3  . montelukast (SINGULAIR) 10 MG tablet Take 1 tablet (10 mg  total) by mouth at bedtime. 30 tablet 5   No current facility-administered medications for this visit.    Allergies: Allergies  Allergen Reactions  . Other Other (See Comments)    Cant take some "mycin" drugs   . Tape Other (See Comments)    Blister, irritates skin   Social History: Social History   Socioeconomic History  . Marital status: Married    Spouse name: Junie Bame  . Number of children: 2  . Years of education: Not on file  . Highest education level: Not on file  Occupational History  . Occupation: clinical research  Social Needs  . Financial resource strain: Not on file  . Food insecurity:    Worry: Not on file    Inability: Not on file  . Transportation needs:    Medical: Not on file    Non-medical: Not on file  Tobacco Use  . Smoking status: Never Smoker  . Smokeless tobacco: Never Used  Substance and Sexual Activity  . Alcohol use: Yes    Alcohol/week: 1.0 standard drinks    Types: 1 Standard  drinks or equivalent per week    Comment: occassionally  . Drug use: No  . Sexual activity: Yes    Birth control/protection: None  Lifestyle  . Physical activity:    Days per week: Not on file    Minutes per session: Not on file  . Stress: Not on file  Relationships  . Social connections:    Talks on phone: Not on file    Gets together: Not on file    Attends religious service: Not on file    Active member of club or organization: Not on file    Attends meetings of clubs or organizations: Not on file    Relationship status: Not on file  Other Topics Concern  . Not on file  Social History Narrative   ** Merged History Encounter **       Lives in an apartment. Smoking: denies Occupation: Clinical biochemist HistoryFreight forwarder in the house: no Carpet in the family room: yes Carpet in the bedroom: yes Heating: electric Cooling: central Pet: yes 1 dog part time when daughter comes home.   Family History: Family  History  Problem Relation Age of Onset  . COPD Mother   . Asthma Mother   . Allergic rhinitis Mother   . Lung cancer Father   . Diverticulitis Father   . Sleep apnea Father   . Asthma Father   . Allergic rhinitis Father   . Colitis Neg Hx   . Urticaria Neg Hx   . Eczema Neg Hx    Problem                               Relation Asthma                                   Father, mother, daughter  Eczema                                No  Food allergy                          No  Allergic rhino conjunctivitis     Father, mother, daughter, son   Review of Systems  Constitutional: Negative for appetite change, chills, fever and unexpected weight change.  HENT: Positive for congestion, postnasal drip, rhinorrhea, sinus pressure, sneezing and sore throat.   Eyes: Positive for itching.  Respiratory: Positive for cough. Negative for chest tightness, shortness of breath and wheezing.   Cardiovascular: Negative for chest pain.  Gastrointestinal: Negative for abdominal pain.  Genitourinary: Negative for difficulty urinating.  Skin: Negative for rash.  Allergic/Immunologic: Positive for environmental allergies. Negative for food allergies.  Neurological: Positive for headaches.   Objective: BP 118/68 (BP Location: Right Arm, Patient Position: Sitting, Cuff Size: Large)   Pulse 78   Resp 18   Ht 5' 5.5" (1.664 m)   Wt 253 lb 9.6 oz (115 kg)   SpO2 97%   BMI 41.56 kg/m  Body mass index is 41.56 kg/m. Physical Exam  Constitutional: She is oriented to person, place, and time. She appears well-developed and well-nourished.  HENT:  Head: Normocephalic and atraumatic.  Right Ear: External ear normal.  Left Ear: External ear normal.  Nose: Mucosal edema present.  Mouth/Throat: Oropharynx is clear and moist.  Eyes: Conjunctivae and EOM are normal.  Neck: Neck supple.  Left sided swelling  Cardiovascular: Normal rate, regular rhythm and normal heart sounds. Exam reveals no gallop and no  friction rub.  No murmur heard. Pulmonary/Chest: Effort normal and breath sounds normal. She has no wheezes. She has no rales.  Abdominal: Soft. Bowel sounds are normal. There is no tenderness.  Neurological: She is alert and oriented to person, place, and time.  Skin: Skin is warm. No rash noted.  Psychiatric: She has a normal mood and affect. Her behavior is normal.  Nursing note and vitals reviewed.  The plan was reviewed with the patient/family, and all questions/concerned were addressed.  It was my pleasure to see [name] today and participate in her care. Please feel free to contact me with any questions or concerns.  Sincerely,  Rexene Alberts, DO Allergy & Immunology  [facility] office: [phone] [place]

## 2018-01-24 NOTE — Addendum Note (Signed)
Addended by: Valere Dross on: 01/24/2018 03:36 PM   Modules accepted: Orders

## 2018-01-24 NOTE — Assessment & Plan Note (Signed)
Diagnosed with asthma as a child.  Triggers include allergies, weather changes, exercise and pet exposure.  Symptoms occurring less than once a week.  Uses albuterol and nebulizer as needed with good benefit.  Today's spirometry was normal.  May use albuterol rescue inhaler 2 puffs or nebulizer every 4 to 6 hours as needed for shortness of breath, chest tightness, coughing, and wheezing. May use albuterol rescue inhaler 2 puffs 5 to 15 minutes prior to strenuous physical activities.  Monitor symptoms.

## 2018-01-29 NOTE — Progress Notes (Signed)
VIALS EXP 01-30-19

## 2018-01-30 DIAGNOSIS — J301 Allergic rhinitis due to pollen: Secondary | ICD-10-CM | POA: Diagnosis not present

## 2018-01-31 DIAGNOSIS — J3089 Other allergic rhinitis: Secondary | ICD-10-CM | POA: Diagnosis not present

## 2018-02-07 ENCOUNTER — Ambulatory Visit: Payer: BLUE CROSS/BLUE SHIELD

## 2018-02-12 ENCOUNTER — Other Ambulatory Visit: Payer: Self-pay | Admitting: Family Medicine

## 2018-02-12 DIAGNOSIS — E559 Vitamin D deficiency, unspecified: Secondary | ICD-10-CM

## 2018-02-13 ENCOUNTER — Ambulatory Visit (INDEPENDENT_AMBULATORY_CARE_PROVIDER_SITE_OTHER): Payer: BLUE CROSS/BLUE SHIELD | Admitting: *Deleted

## 2018-02-13 DIAGNOSIS — J309 Allergic rhinitis, unspecified: Secondary | ICD-10-CM | POA: Diagnosis not present

## 2018-02-13 MED ORDER — EPINEPHRINE 0.3 MG/0.3ML IJ SOAJ: 0.3000 mg | Freq: Once | INTRAMUSCULAR | 1 refills | Status: AC

## 2018-02-13 NOTE — Progress Notes (Signed)
Immunotherapy   Patient Details  Name: Deysy Schabel MRN: [id number] Date of Birth: 1960/04/10  02/13/2018  Reyes Ivan started injections for  [facility] & Grass-Cat Following schedule: B  Frequency:2 times per week Epi-Pen:Epi-Pen Available  Consent signed and patient instructions given. No problem after 30 minutes in the office.   Horris Latino 02/13/2018, 10:47 AM

## 2018-02-15 ENCOUNTER — Ambulatory Visit (INDEPENDENT_AMBULATORY_CARE_PROVIDER_SITE_OTHER): Payer: BLUE CROSS/BLUE SHIELD | Admitting: Bariatrics

## 2018-02-15 ENCOUNTER — Encounter (INDEPENDENT_AMBULATORY_CARE_PROVIDER_SITE_OTHER): Payer: Self-pay | Admitting: Bariatrics

## 2018-02-15 VITALS — BP 129/78 | HR 100 | Temp 97.9°F | Ht 67.0 in | Wt 245.0 lb

## 2018-02-15 DIAGNOSIS — Z9189 Other specified personal risk factors, not elsewhere classified: Secondary | ICD-10-CM | POA: Diagnosis not present

## 2018-02-15 DIAGNOSIS — E8881 Metabolic syndrome: Secondary | ICD-10-CM | POA: Diagnosis not present

## 2018-02-15 DIAGNOSIS — Z6838 Body mass index (BMI) 38.0-38.9, adult: Secondary | ICD-10-CM

## 2018-02-15 DIAGNOSIS — E559 Vitamin D deficiency, unspecified: Secondary | ICD-10-CM

## 2018-02-15 DIAGNOSIS — F3289 Other specified depressive episodes: Secondary | ICD-10-CM

## 2018-02-15 MED ORDER — BUPROPION HCL ER (SR) 200 MG PO TB12: 200.0000 mg | ORAL_TABLET | Freq: Every day | ORAL | 0 refills | Status: DC

## 2018-02-15 MED ORDER — TOPIRAMATE 50 MG PO TABS: 50.0000 mg | ORAL_TABLET | Freq: Every day | ORAL | 0 refills | Status: DC

## 2018-02-15 MED ORDER — METFORMIN HCL 500 MG PO TABS: 500.0000 mg | ORAL_TABLET | Freq: Every day | ORAL | 0 refills | Status: DC

## 2018-02-15 MED ORDER — VITAMIN D3 1.25 MG (50000 UT) PO CAPS: ORAL_CAPSULE | ORAL | 0 refills | Status: DC

## 2018-02-15 NOTE — Progress Notes (Signed)
Office: [phone]  /  Fax: [phone]   HPI:   Chief Complaint: OBESITY [name] is here to discuss her progress with her obesity treatment plan. She is on the Category 2 plan and is following her eating plan approximately 40 % of the time. She states she is exercising 0 minutes 0 times per week. [name] is doing well. She has been under more stress, but her hunger has been minimized. She is taking a new job in [place], [place]. Her weight is 245 lb (111.1 kg) today and has had a weight loss of 5 pounds over a period of 5 weeks since her last visit. She has lost 12 lbs since starting treatment with [name].  Vitamin D deficiency [name] has a diagnosis of vitamin D deficiency. She is currently taking vit D and denies nausea, vomiting or muscle weakness.  At risk for osteopenia and osteoporosis [name] is at higher risk of osteopenia and osteoporosis due to vitamin D deficiency.   Insulin Resistance [name] has a diagnosis of insulin resistance based on her elevated fasting insulin level >5. Although [name]'s blood glucose readings are still under good control, insulin resistance puts her at greater risk of metabolic syndrome and diabetes. Her last A1c was at 5.5 and insulin level was at 25.8  She is taking metformin currently and continues to work on diet and exercise to decrease risk of diabetes.  Depression with emotional eating behaviors [name] is struggling with emotional eating and using food for comfort to the extent that it is negatively impacting her health. She often snacks when she is not hungry. [name] sometimes feels she is out of control and then feels guilty that she made poor food choices. She has been working on behavior modification techniques to help reduce her emotional eating and has been somewhat successful. She shows no sign of suicidal or homicidal ideations.  Depression screen [facility] 2/9 10/24/2017 05/17/2017  Decreased Interest 3 0  Down, Depressed,  Hopeless 2 1  PHQ - 2 Score 5 1  Altered sleeping 3 2  Tired, decreased energy 3 3  Change in appetite 1 1  Feeling bad or failure about yourself  1 1  Trouble concentrating 1 1  Moving slowly or fidgety/restless 0 1  Suicidal thoughts 0 1  PHQ-9 Score 14 11  Difficult doing work/chores Somewhat difficult -     ASSESSMENT AND PLAN:  Vitamin D deficiency - Plan: Cholecalciferol (VITAMIN D3) 1.25 MG (50000 UT) CAPS  Insulin resistance - Plan: metFORMIN (GLUCOPHAGE) 500 MG tablet  Other depression - with emotional eating - Plan: buPROPion (WELLBUTRIN SR) 200 MG 12 hr tablet, topiramate (TOPAMAX) 50 MG tablet  At risk for osteoporosis  Class 2 severe obesity with serious comorbidity and body mass index (BMI) of 38.0 to 38.9 in adult, unspecified obesity type (HCC)  PLAN:  Vitamin D Deficiency [name] was informed that low vitamin D levels contributes to fatigue and are associated with obesity, breast, and colon cancer. She agrees to continue to take prescription Vit D @50 ,000 IU every week #4 with no refills and will follow up for routine testing of vitamin D, at least 2-3 times per year. She was informed of the risk of over-replacement of vitamin D and agrees to not increase her dose unless she discusses this with [name] first. [name] agrees to follow up as directed.  At risk for osteopenia and osteoporosis [name] was given extended  (15 minutes) osteoporosis prevention counseling today. [name] is at risk for osteopenia and osteoporosis  due to her vitamin D deficiency. She was encouraged to take her vitamin D and follow her higher calcium diet and increase strengthening exercise to help strengthen her bones and decrease her risk of osteopenia and osteoporosis.  Insulin Resistance [name] will continue to work on weight loss, exercise, and decreasing simple carbohydrates in her diet to help decrease the risk of diabetes. We dicussed metformin including benefits and risks. She was  informed that eating too many simple carbohydrates or too many calories at one sitting increases the likelihood of GI side effects. [name] agreed to continue metformin 500 mg daily #30 with no refills and follow up with [name] as directed to monitor her progress.  Depression with Emotional Eating Behaviors We discussed behavior modification techniques today to help [name] deal with her emotional eating and depression. She has agreed to continue Bupropion (Wellbutrin SR) 200 mg qd #30 with no refills and Topamax 50 mg qd #30 with no refills and follow up as directed.  Obesity [name] is currently in the action stage of change. As such, her goal is to continue with weight loss efforts She has agreed to follow the Category 2 plan [name] has been instructed to work up to a goal of 150 minutes of combined cardio and strengthening exercise per week for weight loss and overall health benefits. We discussed the following Behavioral Modification Strategies today: increase H2O intake, no skipping meals, better snacking choices, increasing lean protein intake, decreasing simple carbohydrates, increasing vegetables, work on more meal planning and easy cooking plans and avoiding temptations [name] will bring her lunch (eggs, meat, cheese, etc.). "100 calorie snack ideas" was provided to patient today.  [name] has agreed to follow up with our clinic in 4 weeks. She was informed of the importance of frequent follow up visits to maximize her success with intensive lifestyle modifications for her multiple health conditions.  ALLERGIES: Allergies  Allergen Reactions  . Other Other (See Comments)    Cant take some "mycin" drugs   . Tape Other (See Comments)    Blister, irritates skin    MEDICATIONS: Current Outpatient Medications on File Prior to Visit  Medication Sig Dispense Refill  . albuterol (PROVENTIL HFA;VENTOLIN HFA) 108 (90 Base) MCG/ACT inhaler Inhale 1 puff into the lungs every 6 (six)  hours as needed for wheezing or shortness of breath. 1 Inhaler 6  . Fluticasone Propionate (XHANCE) 93 MCG/ACT EXHU Place 2 puffs into the nose 2 (two) times daily. 16 mL 3  . HYDROcodone-acetaminophen (NORCO/VICODIN) 5-325 MG tablet Take 1 tablet by mouth every 6 (six) hours as needed for moderate pain. 60 tablet 0  . ipratropium (ATROVENT) 0.03 % nasal spray Place 2 sprays into both nostrils 4 (four) times daily. 90 mL 3  . methocarbamol (ROBAXIN) 500 MG tablet Take 1 tablet (500 mg total) by mouth 2 (two) times daily. 20 tablet 0  . montelukast (SINGULAIR) 10 MG tablet Take 1 tablet (10 mg total) by mouth at bedtime. 30 tablet 5  . oxyCODONE-acetaminophen (PERCOCET/ROXICET) 5-325 MG tablet Take 2 tablets by mouth every 4 (four) hours as needed for severe pain. 10 tablet 0  . pantoprazole (PROTONIX) 40 MG tablet Take twice daily for 3 months, then daily 60 tablet 11  . pramipexole (MIRAPEX) 1.5 MG tablet TAKE 2 TABLETS (3 MG TOTAL) BY MOUTH 2 (TWO) TIMES DAILY AS NEEDED (FOR RESTLESS LEG SYNDROME 344 tablet 1  . triamcinolone cream (KENALOG) 0.1 % Apply 1 application topically 2 (two) times daily. 45 g 0  No current facility-administered medications on file prior to visit.     PAST MEDICAL HISTORY: Past Medical History:  Diagnosis Date  . Abnormal liver function   . Anemia   . Arthritis    back & knees   . Asthma    uses albuterol inhaler if outside, cold weather & exercising   . Depression   . Dyspnea   . Fatty liver   . GERD (gastroesophageal reflux disease)   . History of hiatal hernia   . History of kidney stones    passed spontaneously  . IBS (irritable bowel syndrome)   . Iron deficiency anemia   . Joint pain   . Lactose intolerance   . Leg edema   . Melanoma ([place])    malignant, left leg,  wide excision, sentinel node, one positive stage 3, as a result- lymphdema, will be following with ONCOLOGY, PET scan- last 2017  . Myasthenia gravis ([place])   . Myasthenia gravis ([place])  2000  . Osteoarthritis   . Pneumonia 2015   hosp. [facility] , treated with IV antibiotics   . Recurrent upper respiratory infection (URI)   . Sleep apnea    sleep irregular pattern- no CPAP order solidified yet.   Marland Kitchen Spinal stenosis   . Vitamin B deficiency   . Vitamin D deficiency     PAST SURGICAL HISTORY: Past Surgical History:  Procedure Laterality Date  . APPENDECTOMY    . BACK SURGERY    . BREAST SURGERY Bilateral 2004   reduction   . EYE SURGERY Bilateral 1999   corneal peel  . MELANOMA EXCISION Left    removed from left leg  . OOPHORECTOMY Left   . THYMECTOMY  2001  . TOTAL KNEE ARTHROPLASTY Left 01/2016  . WRIST SURGERY Left    plate & 6 screws     SOCIAL HISTORY: Social History   Tobacco Use  . Smoking status: Never Smoker  . Smokeless tobacco: Never Used  Substance Use Topics  . Alcohol use: Yes    Alcohol/week: 1.0 standard drinks    Types: 1 Standard drinks or equivalent per week    Comment: occassionally  . Drug use: No    FAMILY HISTORY: Family History  Problem Relation Age of Onset  . COPD Mother   . Asthma Mother   . Allergic rhinitis Mother   . Lung cancer Father   . Diverticulitis Father   . Sleep apnea Father   . Asthma Father   . Allergic rhinitis Father   . Colitis Neg Hx   . Urticaria Neg Hx   . Eczema Neg Hx     ROS: Review of Systems  Constitutional: Positive for weight loss.  Gastrointestinal: Negative for nausea and vomiting.  Musculoskeletal:       Negative for muscle weakness    PHYSICAL EXAM: Blood pressure 129/78, pulse 100, temperature 97.9 F (36.6 C), temperature source Oral, height 5\' 7"  (1.702 m), weight 245 lb (111.1 kg), SpO2 95 %. Body mass index is 38.37 kg/m. Physical Exam Vitals signs reviewed.  Constitutional:      Appearance: Normal appearance. She is well-developed. She is obese.  Cardiovascular:     Rate and Rhythm: Normal rate.  Pulmonary:     Effort: Pulmonary effort is normal.    Musculoskeletal: Normal range of motion.  Skin:    General: Skin is warm and dry.  Neurological:     Mental Status: She is alert and oriented to person, place, and time.  Psychiatric:        Mood and Affect: Mood normal.        Behavior: Behavior normal.     RECENT LABS AND TESTS: BMET    Component Value Date/Time   NA 136 12/21/2017 1126   NA 140 10/24/2017 0935   K 4.3 12/21/2017 1126   CL 107 12/21/2017 1126   CO2 30 12/21/2017 1126   GLUCOSE 100 12/21/2017 1126   BUN 18 12/21/2017 1126   BUN 17 10/24/2017 0935   CREATININE 1.00 12/21/2017 1126   CALCIUM 9.5 12/21/2017 1126   GFRNONAA 74 10/24/2017 0935   GFRNONAA >60 06/15/2017 1338   GFRAA 86 10/24/2017 0935   GFRAA >60 06/15/2017 1338   Lab Results  Component Value Date   HGBA1C 5.5 10/24/2017   HGBA1C 5.8 (H) 04/26/2017   Lab Results  Component Value Date   INSULIN 25.8 (H) 10/24/2017   CBC    Component Value Date/Time   WBC 4.6 12/21/2017 1126   WBC 8.0 10/09/2017 1604   RBC 4.17 12/21/2017 1126   HGB 12.1 12/21/2017 1126   HCT 37.9 12/21/2017 1126   PLT 292 12/21/2017 1126   MCV 90.9 12/21/2017 1126   MCH 29.0 12/21/2017 1126   MCHC 31.9 12/21/2017 1126   RDW 13.1 12/21/2017 1126   LYMPHSABS 1.6 12/21/2017 1126   MONOABS 0.3 12/21/2017 1126   EOSABS 0.3 12/21/2017 1126   BASOSABS 0.1 12/21/2017 1126   Iron/TIBC/Ferritin/ %Sat    Component Value Date/Time   IRON 69 12/21/2017 1126   TIBC 346 12/21/2017 1126   FERRITIN 41 12/21/2017 1126   IRONPCTSAT 20 (L) 12/21/2017 1126   Lipid Panel     Component Value Date/Time   CHOL 199 10/24/2017 0935   TRIG 129 10/24/2017 0935   HDL 52 10/24/2017 0935   CHOLHDL 3.9 04/26/2017 1029   VLDL 31 04/26/2017 1029   LDLCALC 121 (H) 10/24/2017 0935   Hepatic Function Panel     Component Value Date/Time   PROT 7.3 12/21/2017 1126   PROT 7.3 10/24/2017 0935   ALBUMIN 3.9 12/21/2017 1126   ALBUMIN 4.4 10/24/2017 0935   AST 21 12/21/2017 1126    ALT 18 12/21/2017 1126   ALKPHOS 110 (H) 12/21/2017 1126   BILITOT 0.6 12/21/2017 1126   BILIDIR <0.1 (L) 04/26/2017 1029   IBILI NOT CALCULATED 04/26/2017 1029      Component Value Date/Time   TSH 1.62 10/09/2017 1604   TSH 1.528 04/26/2017 1029   TSH 1.00 03/25/2008 1153    Ref. Range 10/24/2017 09:35  Vitamin D, 25-Hydroxy Latest Ref Range: 30.0 - 100.0 ng/mL 19.0 (L)     OBESITY BEHAVIORAL INTERVENTION VISIT  Today's visit was # 7   Starting weight: 257 lbs Starting date: 10/24/2017 Today's weight : 245 lbs Today's date: 02/15/2018 Total lbs lost to date: 12   ASK: We discussed the diagnosis of obesity with Reyes Ivan today and Karyna agreed to give [name] permission to discuss obesity behavioral modification therapy today.  ASSESS: Shaneil has the diagnosis of obesity and her BMI today is 38.36 Doralene is in the action stage of change   ADVISE: Majesti was educated on the multiple health risks of obesity as well as the benefit of weight loss to improve her health. She was advised of the need for long term treatment and the importance of lifestyle modifications to improve her current health and to decrease her risk of future health problems.  AGREE: Multiple dietary modification options  and treatment options were discussed and  Karma agreed to follow the recommendations documented in the above note.  ARRANGE: Jeslie was educated on the importance of frequent visits to treat obesity as outlined per CMS and USPSTF guidelines and agreed to schedule her next follow up appointment today.  Corey Skains, am acting as Location manager for [employer]. Owens Shark, DO  I have reviewed the above documentation for accuracy and completeness, and I agree with the above. -Jearld Lesch, DO

## 2018-02-22 ENCOUNTER — Other Ambulatory Visit: Payer: Self-pay | Admitting: Family Medicine

## 2018-02-22 DIAGNOSIS — R52 Pain, unspecified: Secondary | ICD-10-CM

## 2018-02-22 NOTE — Telephone Encounter (Signed)
Copied from [place] [phone]. Topic: Quick Communication - Rx Refill/Question >> Feb 22, 2018  3:43 PM Ahmed Prima L wrote: Medication: HYDROcodone-acetaminophen (NORCO/VICODIN) 5-325 MG tablet  Has the patient contacted their pharmacy? Yes told her to call office  (Agent: If no, request that the patient contact the pharmacy for the refill.) (Agent: If yes, when and what did the pharmacy advise?)  Preferred Pharmacy (with phone number or street name): [id number] - [place], [place] [address]    Agent: Please be advised that RX refills may take up to 3 business days. We ask that you follow-up with your pharmacy.

## 2018-02-22 NOTE — Telephone Encounter (Signed)
Requested medication (s) are due for refill today: yes  Requested medication (s) are on the active medication list: yes    Last refill: 01/23/18  #60  0 refills  Future visit scheduled no  Notes to clinic:not delegated  Requested Prescriptions  Pending Prescriptions Disp Refills   HYDROcodone-acetaminophen (NORCO/VICODIN) 5-325 MG tablet 60 tablet 0    Sig: Take 1 tablet by mouth every 6 (six) hours as needed for moderate pain.     Not Delegated - Analgesics:  Opioid Agonist Combinations Failed - 02/22/2018  3:48 PM      Failed - This refill cannot be delegated      Failed - Urine Drug Screen completed in last 360 days.      Passed - Valid encounter within last 6 months    Recent Outpatient Visits          3 months ago Vitamin D deficiency   Archivist at [place], [place]   4 months ago [facility] [facility] [place], [place]   6 months ago Leg edema   Therapist, music at [place], Doretha Sou, MD   7 months ago Leg edema   Archivist at [place], Gay Filler, MD   5 years ago Muscle contraction headache   [facility], [place]

## 2018-02-22 NOTE — Telephone Encounter (Addendum)
Refill Request: Norco/Vicodin  Last RX:01/23/18 Last OV:02/15/18 Next XW:NPIO Scheduled  UDS:07/12/17 CSC:07/12/17 CSR:

## 2018-02-23 MED ORDER — HYDROCODONE-ACETAMINOPHEN 5-325 MG PO TABS: 1.0000 | ORAL_TABLET | Freq: Four times a day (QID) | ORAL | 0 refills | Status: DC | PRN

## 2018-02-23 NOTE — Telephone Encounter (Signed)
Seen in September  NCCSR: 02/06/2018  1   01/09/2018  Lorazepam 2 Mg Tablet  30.00 30 Je Cop  [id number]  [place] ([id number])  1/4 2.00 LME Comm Ins  [place]  01/23/2018  1   01/23/2018  Hydrocodone-Acetamin 5-325 Mg  90.00 30 Je Cop  [id number]  [place] ([id number])  0/0 15.00 MME Comm Ins  [place]  01/09/2018  1   01/09/2018  Lorazepam 2 Mg Tablet  30.00 30 Je Cop  [id number]  [place] ([id number])  0/4 2.00 LME Comm Ins  [place]  12/11/2017  1   07/06/2017  Lorazepam 2 Mg Tablet  30.00 30 Je Cop  [id number]  [place] ([id number])  5/5 2.00 LME Private Pay  [place]  12/06/2017  1   12/06/2017  Hydrocodone-Acetamin 5-325 Mg  90.00 30 Je Cop  [id number]  [place] ([id number])  0/0 15.00 MME Comm Ins    11/11/2017  1   07/06/2017  Lorazepam 2 Mg Tablet  30.00 30 Je Cop  [id number]  [place] ([id number])  4/5      UDS is UTD, ok to refill

## 2018-03-02 ENCOUNTER — Ambulatory Visit (INDEPENDENT_AMBULATORY_CARE_PROVIDER_SITE_OTHER): Payer: BLUE CROSS/BLUE SHIELD | Admitting: Family Medicine

## 2018-03-02 ENCOUNTER — Encounter: Payer: Self-pay | Admitting: Family Medicine

## 2018-03-02 DIAGNOSIS — J0100 Acute maxillary sinusitis, unspecified: Secondary | ICD-10-CM

## 2018-03-02 MED ORDER — AZITHROMYCIN 250 MG PO TABS: ORAL_TABLET | ORAL | 0 refills | Status: DC

## 2018-03-02 NOTE — Assessment & Plan Note (Signed)
-  Will cover with antibiotics given history of thymectomy and potential immune dysfunction -Azithromycin has worked better for her in the past, rx for azithromycin x5 days.  -Increase fluid intake -Recommend humidifier/vaporizer -May add mucinex -Call or f/u if not improving.

## 2018-03-02 NOTE — Progress Notes (Signed)
[name] - 58 y.o. female MRN [id number]  Date of birth: 05/03/1960  Subjective Chief Complaint  Patient presents with  . Cough    Productive-started two days ago. Denies fevers or body aches. Mucus green in color.    HPI  [name] is a 58 y.o. female who complains of congestion, sore throat, nasal blockage, productive cough, headache and sinus pain for 2-3 days.  She has a history of myasthenia gravis s/p thymectomy and reports that illnesses often advance rapidly due to her immune dysfunction.   She denies a history of chest pain, fevers, myalgias and shortness of breath and does not a history of asthma. Patient does not smoke cigarettes.  z-pack has worked best for her during these episodes in the past.   ROS:  A comprehensive ROS was completed and negative except as noted per HPI    Allergies  Allergen Reactions  . Other Other (See Comments)    Cant take some "mycin" drugs   . Tape Other (See Comments)    Blister, irritates skin    Past Medical History:  Diagnosis Date  . Abnormal liver function   . Anemia   . Arthritis    back & knees   . Asthma    uses albuterol inhaler if outside, cold weather & exercising   . Depression   . Dyspnea   . Fatty liver   . GERD (gastroesophageal reflux disease)   . History of hiatal hernia   . History of kidney stones    passed spontaneously  . IBS (irritable bowel syndrome)   . Iron deficiency anemia   . Joint pain   . Lactose intolerance   . Leg edema   . Melanoma ([place])    malignant, left leg,  wide excision, sentinel node, one positive stage 3, as a result- lymphdema, will be following with ONCOLOGY, PET scan- last 2017  . Myasthenia gravis ([place])   . Myasthenia gravis ([place]) 2000  . Osteoarthritis   . Pneumonia 2015   hosp. [facility] , treated with IV antibiotics   . Recurrent upper respiratory infection (URI)   . Sleep apnea    sleep irregular pattern- no CPAP order solidified yet.   Marland Kitchen Spinal stenosis     . Vitamin B deficiency   . Vitamin D deficiency     Past Surgical History:  Procedure Laterality Date  . APPENDECTOMY    . BACK SURGERY    . BREAST SURGERY Bilateral 2004   reduction   . EYE SURGERY Bilateral 1999   corneal peel  . MELANOMA EXCISION Left    removed from left leg  . OOPHORECTOMY Left   . THYMECTOMY  2001  . TOTAL KNEE ARTHROPLASTY Left 01/2016  . WRIST SURGERY Left    plate & 6 screws     Social History   Socioeconomic History  . Marital status: Married    Spouse name: Junie Bame  . Number of children: 2  . Years of education: Not on file  . Highest education level: Not on file  Occupational History  . Occupation: clinical research  Social Needs  . Financial resource strain: Not on file  . Food insecurity:    Worry: Not on file    Inability: Not on file  . Transportation needs:    Medical: Not on file    Non-medical: Not on file  Tobacco Use  . Smoking status: Never Smoker  . Smokeless tobacco: Never Used  Substance and Sexual Activity  .  Alcohol use: Yes    Alcohol/week: 1.0 standard drinks    Types: 1 Standard drinks or equivalent per week    Comment: occassionally  . Drug use: No  . Sexual activity: Yes    Birth control/protection: None  Lifestyle  . Physical activity:    Days per week: Not on file    Minutes per session: Not on file  . Stress: Not on file  Relationships  . Social connections:    Talks on phone: Not on file    Gets together: Not on file    Attends religious service: Not on file    Active member of club or organization: Not on file    Attends meetings of clubs or organizations: Not on file    Relationship status: Not on file  Other Topics Concern  . Not on file  Social History Narrative   ** Merged History Encounter **        Family History  Problem Relation Age of Onset  . COPD Mother   . Asthma Mother   . Allergic rhinitis Mother   . Lung cancer Father   . Diverticulitis Father   . Sleep apnea Father    . Asthma Father   . Allergic rhinitis Father   . Colitis Neg Hx   . Urticaria Neg Hx   . Eczema Neg Hx     Health Maintenance  Topic Date Due  . TETANUS/TDAP  09/14/1979  . INFLUENZA VACCINE  02/15/2021 (Originally 09/28/2017)  . MAMMOGRAM  01/26/2019  . PAP SMEAR-Modifier  12/30/2019  . COLONOSCOPY  01/28/2027  . Hepatitis C Screening  Completed  . HIV Screening  Completed    ----------------------------------------------------------------------------------------------------------------------------------------------------------------------------------------------------------------- Physical Exam BP 128/74   Pulse 84   Temp 98.2 F (36.8 C) (Oral)   Ht 5\' 7"  (1.702 m)   Wt 251 lb (113.9 kg)   SpO2 97%   BMI 39.31 kg/m   Physical Exam Constitutional:      Appearance: She is ill-appearing. She is not toxic-appearing.  HENT:     Head: Normocephalic and atraumatic.     Right Ear: Tympanic membrane normal.     Left Ear: Tympanic membrane normal.     Nose: Nose normal.     Comments: Maxillary sinus tenderness- b/l     Mouth/Throat:     Mouth: Mucous membranes are moist.  Eyes:     General: No scleral icterus. Cardiovascular:     Rate and Rhythm: Normal rate and regular rhythm.  Pulmonary:     Effort: Pulmonary effort is normal.     Breath sounds: Normal breath sounds.  Lymphadenopathy:     Cervical: No cervical adenopathy.  Skin:    General: Skin is warm and dry.     Findings: No rash.  Neurological:     General: No focal deficit present.     Mental Status: She is alert.  Psychiatric:        Mood and Affect: Mood normal.        Behavior: Behavior normal.     ------------------------------------------------------------------------------------------------------------------------------------------------------------------------------------------------------------------- Assessment and Plan  Acute maxillary sinusitis -Will cover with antibiotics given history  of thymectomy and potential immune dysfunction -Azithromycin has worked better for her in the past, rx for azithromycin x5 days.  -Increase fluid intake -Recommend humidifier/vaporizer -May add mucinex -Call or f/u if not improving.

## 2018-03-02 NOTE — Patient Instructions (Signed)

## 2018-03-13 ENCOUNTER — Other Ambulatory Visit: Payer: Self-pay | Admitting: Family

## 2018-03-13 DIAGNOSIS — G2581 Restless legs syndrome: Secondary | ICD-10-CM

## 2018-03-14 ENCOUNTER — Other Ambulatory Visit (INDEPENDENT_AMBULATORY_CARE_PROVIDER_SITE_OTHER): Payer: Self-pay | Admitting: Bariatrics

## 2018-03-14 DIAGNOSIS — F3289 Other specified depressive episodes: Secondary | ICD-10-CM

## 2018-03-15 ENCOUNTER — Ambulatory Visit (INDEPENDENT_AMBULATORY_CARE_PROVIDER_SITE_OTHER): Payer: BLUE CROSS/BLUE SHIELD | Admitting: Bariatrics

## 2018-03-15 ENCOUNTER — Encounter (INDEPENDENT_AMBULATORY_CARE_PROVIDER_SITE_OTHER): Payer: Self-pay | Admitting: Bariatrics

## 2018-03-15 ENCOUNTER — Other Ambulatory Visit (INDEPENDENT_AMBULATORY_CARE_PROVIDER_SITE_OTHER): Payer: Self-pay | Admitting: Bariatrics

## 2018-03-15 VITALS — BP 122/82 | HR 64 | Temp 97.9°F | Ht 67.0 in | Wt 240.0 lb

## 2018-03-15 DIAGNOSIS — E8881 Metabolic syndrome: Secondary | ICD-10-CM

## 2018-03-15 DIAGNOSIS — F3289 Other specified depressive episodes: Secondary | ICD-10-CM

## 2018-03-15 DIAGNOSIS — E559 Vitamin D deficiency, unspecified: Secondary | ICD-10-CM

## 2018-03-15 DIAGNOSIS — Z9189 Other specified personal risk factors, not elsewhere classified: Secondary | ICD-10-CM

## 2018-03-15 DIAGNOSIS — Z6837 Body mass index (BMI) 37.0-37.9, adult: Secondary | ICD-10-CM

## 2018-03-15 MED ORDER — METFORMIN HCL 500 MG PO TABS: 500.0000 mg | ORAL_TABLET | Freq: Every day | ORAL | 0 refills | Status: DC

## 2018-03-15 MED ORDER — VITAMIN D (ERGOCALCIFEROL) 1.25 MG (50000 UNIT) PO CAPS: 50000.0000 [IU] | ORAL_CAPSULE | ORAL | 0 refills | Status: DC

## 2018-03-15 MED ORDER — TOPIRAMATE 50 MG PO TABS: 50.0000 mg | ORAL_TABLET | Freq: Every day | ORAL | 0 refills | Status: DC

## 2018-03-15 MED ORDER — BUPROPION HCL ER (SR) 200 MG PO TB12: 200.0000 mg | ORAL_TABLET | Freq: Every day | ORAL | 0 refills | Status: DC

## 2018-03-19 NOTE — Progress Notes (Signed)
Office: [phone]  /  Fax: [phone]   HPI:   Chief Complaint: OBESITY [name] is here to discuss her progress with her obesity treatment plan. She is on the Category 2 plan and is following her eating plan approximately 60 % of the time. She states she is exercising 0 minutes 0 times per week. [name] is doing well with her plan. She attributes her weight loss to increased stress. Her weight is 250 lb (113.4 kg) today and has had a weight loss of 5 pounds over a period of 4 weeks since her last visit. She has lost 17 lbs since starting treatment with [name].  Vitamin D deficiency [name] has a diagnosis of vitamin D deficiency. She is currently taking vit D and denies nausea, vomiting or muscle weakness.  Insulin Resistance [name] has a diagnosis of insulin resistance based on her elevated fasting insulin level >5. Although [name]'s blood glucose readings are still under good control, insulin resistance puts her at greater risk of metabolic syndrome and diabetes. She is taking metformin currently and continues to work on diet and exercise to decrease risk of diabetes. Prescious denies polyphagia.  At risk for diabetes [name] is at higher than average risk for developing diabetes due to her obesity and insulin resistance. She currently denies polyuria or polydipsia.  Depression with emotional eating behaviors [name] is struggling with emotional eating and using food for comfort to the extent that it is negatively impacting her health. She often snacks when she is not hungry. [name] sometimes feels she is out of control and then feels guilty that she made poor food choices. She has been working on behavior modification techniques to help reduce her emotional eating and has been somewhat successful. She shows no sign of suicidal or homicidal ideations.  Depression screen [facility] 2/9 10/24/2017 05/17/2017  Decreased Interest 3 0  Down, Depressed, Hopeless 2 1  PHQ - 2 Score 5 1    Altered sleeping 3 2  Tired, decreased energy 3 3  Change in appetite 1 1  Feeling bad or failure about yourself  1 1  Trouble concentrating 1 1  Moving slowly or fidgety/restless 0 1  Suicidal thoughts 0 1  PHQ-9 Score 14 11  Difficult doing work/chores Somewhat difficult -      ASSESSMENT AND PLAN:  Insulin resistance - Plan: metFORMIN (GLUCOPHAGE) 500 MG tablet  Vitamin D deficiency - Plan: Vitamin D, Ergocalciferol, (DRISDOL) 1.25 MG (50000 UT) CAPS capsule  Other depression - with emotional eating - Plan: buPROPion (WELLBUTRIN SR) 200 MG 12 hr tablet, topiramate (TOPAMAX) 50 MG tablet  At risk for diabetes mellitus  Class 2 severe obesity with serious comorbidity and body mass index (BMI) of 37.0 to 37.9 in adult, unspecified obesity type (HCC)  PLAN:  Vitamin D Deficiency [name] was informed that low vitamin D levels contributes to fatigue and are associated with obesity, breast, and colon cancer. She agrees to continue to take prescription Vit D @50 ,000 IU every week #4 with no refills and will follow up for routine testing of vitamin D, at least 2-3 times per year. She was informed of the risk of over-replacement of vitamin D and agrees to not increase her dose unless she discusses this with [name] first. Hollyanne agrees to follow up as directed.  Insulin Resistance [name] will continue to work on weight loss, exercise, and decreasing simple carbohydrates in her diet to help decrease the risk of diabetes. We dicussed metformin including benefits and risks. She was informed  that eating too many simple carbohydrates or too many calories at one sitting increases the likelihood of GI side effects. [name] agreed to continue  metformin 500 mg once daily #30 with no refills and follow up with [name] as directed to monitor her progress.  Diabetes risk counseling [name] was given extended (15 minutes) diabetes prevention counseling today. She is 58 y.o. female and has risk factors  for diabetes including obesity and insulin resistance. We discussed intensive lifestyle modifications today with an emphasis on weight loss as well as increasing exercise and decreasing simple carbohydrates in her diet.  Depression with Emotional Eating Behaviors We discussed behavior modification techniques today to help [name] deal with her emotional eating and depression. She has agreed to take Bupropion (Wellbutrin SR) 200 mg qd #30 with no refills and Topamax 50 mg once daily #30 with no refills and follow up as directed.  Obesity [name] is currently in the action stage of change. As such, her goal is to continue with weight loss efforts She has agreed to follow the Category 2 plan [name] has been instructed to work up to a goal of 150 minutes of combined cardio and strengthening exercise per week for weight loss and overall health benefits. We discussed the following Behavioral Modification Strategies today: increase H2O intake, keeping healthy foods in the home, better snacking choices, increasing lean protein intake, decreasing simple carbohydrates, increasing vegetables, decrease eating out and work on meal planning and easy cooking plans [name] will take water and protein sources with her in her car and she will increase protein sources overall.  [name] has agreed to follow up with our clinic in 4 weeks. She was informed of the importance of frequent follow up visits to maximize her success with intensive lifestyle modifications for her multiple health conditions.  ALLERGIES: Allergies  Allergen Reactions  . Other Other (See Comments)    Cant take some "mycin" drugs   . Tape Other (See Comments)    Blister, irritates skin    MEDICATIONS: Current Outpatient Medications on File Prior to Visit  Medication Sig Dispense Refill  . albuterol (PROVENTIL HFA;VENTOLIN HFA) 108 (90 Base) MCG/ACT inhaler Inhale 1 puff into the lungs every 6 (six) hours as needed for wheezing or  shortness of breath. 1 Inhaler 6  . azithromycin (ZITHROMAX) 250 MG tablet Take 2 tabs po on day 1, followed by 1 tab on days 2-5. 6 tablet 0  . Cholecalciferol (VITAMIN D3) 1.25 MG (50000 UT) CAPS TAKE 1 CAPSULE BY MOUTH ONE TIME PER WEEK 4 capsule 0  . Fluticasone Propionate (XHANCE) 93 MCG/ACT EXHU Place 2 puffs into the nose 2 (two) times daily. 16 mL 3  . HYDROcodone-acetaminophen (NORCO/VICODIN) 5-325 MG tablet Take 1 tablet by mouth every 6 (six) hours as needed for moderate pain. 90 tablet 0  . ipratropium (ATROVENT) 0.03 % nasal spray Place 2 sprays into both nostrils 4 (four) times daily. 90 mL 3  . montelukast (SINGULAIR) 10 MG tablet Take 1 tablet (10 mg total) by mouth at bedtime. 30 tablet 5  . pantoprazole (PROTONIX) 40 MG tablet Take twice daily for 3 months, then daily 60 tablet 11  . pramipexole (MIRAPEX) 1.5 MG tablet TAKE 2 TABLETS (3 MG TOTAL) BY MOUTH 2 (TWO) TIMES DAILY AS NEEDED (FOR RESTLESS LEG SYNDROME 344 tablet 1  . triamcinolone cream (KENALOG) 0.1 % Apply 1 application topically 2 (two) times daily. 45 g 0   No current facility-administered medications on file prior to visit.  PAST MEDICAL HISTORY: Past Medical History:  Diagnosis Date  . Abnormal liver function   . Anemia   . Arthritis    back & knees   . Asthma    uses albuterol inhaler if outside, cold weather & exercising   . Depression   . Dyspnea   . Fatty liver   . GERD (gastroesophageal reflux disease)   . History of hiatal hernia   . History of kidney stones    passed spontaneously  . IBS (irritable bowel syndrome)   . Iron deficiency anemia   . Joint pain   . Lactose intolerance   . Leg edema   . Melanoma ([place])    malignant, left leg,  wide excision, sentinel node, one positive stage 3, as a result- lymphdema, will be following with ONCOLOGY, PET scan- last 2017  . Myasthenia gravis ([place])   . Myasthenia gravis ([place]) 2000  . Osteoarthritis   . Pneumonia 2015   [facility]osp. [facility] ,  treated with IV antibiotics   . Recurrent upper respiratory infection (URI)   . Sleep apnea    sleep irregular pattern- no CPAP order solidified yet.   Marland Kitchen Spinal stenosis   . Vitamin B deficiency   . Vitamin D deficiency     PAST SURGICAL HISTORY: Past Surgical History:  Procedure Laterality Date  . APPENDECTOMY    . BACK SURGERY    . BREAST SURGERY Bilateral 2004   reduction   . EYE SURGERY Bilateral 1999   corneal peel  . MELANOMA EXCISION Left    removed from left leg  . OOPHORECTOMY Left   . THYMECTOMY  2001  . TOTAL KNEE ARTHROPLASTY Left 01/2016  . WRIST SURGERY Left    plate & 6 screws     SOCIAL HISTORY: Social History   Tobacco Use  . Smoking status: Never Smoker  . Smokeless tobacco: Never Used  Substance Use Topics  . Alcohol use: Yes    Alcohol/week: 1.0 standard drinks    Types: 1 Standard drinks or equivalent per week    Comment: occassionally  . Drug use: No    FAMILY HISTORY: Family History  Problem Relation Age of Onset  . COPD Mother   . Asthma Mother   . Allergic rhinitis Mother   . Lung cancer Father   . Diverticulitis Father   . Sleep apnea Father   . Asthma Father   . Allergic rhinitis Father   . Colitis Neg Hx   . Urticaria Neg Hx   . Eczema Neg Hx     ROS: Review of Systems  Constitutional: Positive for weight loss.  Gastrointestinal: Negative for nausea and vomiting.  Genitourinary: Negative for frequency.  Musculoskeletal:       Negative for muscle weakness  Endo/Heme/Allergies: Negative for polydipsia.       Negative for polyphagia  Psychiatric/Behavioral: Positive for depression. Negative for suicidal ideas.    PHYSICAL EXAM: Blood pressure 122/82, pulse 64, temperature 97.9 F (36.6 C), temperature source Oral, height 5\' 7"  (1.702 m), weight 250 lb (113.4 kg), SpO2 97 %. Body mass index is 39.16 kg/m. Physical Exam Vitals signs reviewed.  Constitutional:      Appearance: Normal appearance. She is well-developed.  She is obese.  Cardiovascular:     Rate and Rhythm: Normal rate.  Pulmonary:     Effort: Pulmonary effort is normal.  Musculoskeletal: Normal range of motion.  Skin:    General: Skin is warm and dry.  Neurological:  Mental Status: She is alert and oriented to person, place, and time.  Psychiatric:        Mood and Affect: Mood normal.        Behavior: Behavior normal.        Thought Content: Thought content does not include homicidal or suicidal ideation.     RECENT LABS AND TESTS: BMET    Component Value Date/Time   NA 136 12/21/2017 1126   NA 140 10/24/2017 0935   K 4.3 12/21/2017 1126   CL 107 12/21/2017 1126   CO2 30 12/21/2017 1126   GLUCOSE 100 12/21/2017 1126   BUN 18 12/21/2017 1126   BUN 17 10/24/2017 0935   CREATININE 1.00 12/21/2017 1126   CALCIUM 9.5 12/21/2017 1126   GFRNONAA 74 10/24/2017 0935   GFRNONAA >60 06/15/2017 1338   GFRAA 86 10/24/2017 0935   GFRAA >60 06/15/2017 1338   Lab Results  Component Value Date   HGBA1C 5.5 10/24/2017   HGBA1C 5.8 (H) 04/26/2017   Lab Results  Component Value Date   INSULIN 25.8 (H) 10/24/2017   CBC    Component Value Date/Time   WBC 4.6 12/21/2017 1126   WBC 8.0 10/09/2017 1604   RBC 4.17 12/21/2017 1126   HGB 12.1 12/21/2017 1126   HCT 37.9 12/21/2017 1126   PLT 292 12/21/2017 1126   MCV 90.9 12/21/2017 1126   MCH 29.0 12/21/2017 1126   MCHC 31.9 12/21/2017 1126   RDW 13.1 12/21/2017 1126   LYMPHSABS 1.6 12/21/2017 1126   MONOABS 0.3 12/21/2017 1126   EOSABS 0.3 12/21/2017 1126   BASOSABS 0.1 12/21/2017 1126   Iron/TIBC/Ferritin/ %Sat    Component Value Date/Time   IRON 69 12/21/2017 1126   TIBC 346 12/21/2017 1126   FERRITIN 41 12/21/2017 1126   IRONPCTSAT 20 (L) 12/21/2017 1126   Lipid Panel     Component Value Date/Time   CHOL 199 10/24/2017 0935   TRIG 129 10/24/2017 0935   HDL 52 10/24/2017 0935   CHOLHDL 3.9 04/26/2017 1029   VLDL 31 04/26/2017 1029   LDLCALC 121 (H) 10/24/2017  0935   Hepatic Function Panel     Component Value Date/Time   PROT 7.3 12/21/2017 1126   PROT 7.3 10/24/2017 0935   ALBUMIN 3.9 12/21/2017 1126   ALBUMIN 4.4 10/24/2017 0935   AST 21 12/21/2017 1126   ALT 18 12/21/2017 1126   ALKPHOS 110 (H) 12/21/2017 1126   BILITOT 0.6 12/21/2017 1126   BILIDIR <0.1 (L) 04/26/2017 1029   IBILI NOT CALCULATED 04/26/2017 1029      Component Value Date/Time   TSH 1.62 10/09/2017 1604   TSH 1.528 04/26/2017 1029   TSH 1.00 03/25/2008 1153     Ref. Range 10/24/2017 09:35  Vitamin D, 25-Hydroxy Latest Ref Range: 30.0 - 100.0 ng/mL 19.0 (L)     OBESITY BEHAVIORAL INTERVENTION VISIT  Today's visit was # 8   Starting weight: 257 lbs Starting date: 10/24/2017 Today's weight : 240 lbs  Today's date: 03/15/2018 Total lbs lost to date: 17   ASK: We discussed the diagnosis of obesity with [name] today and [name] agreed to give [name] permission to discuss obesity behavioral modification therapy today.  ASSESS: [name] has the diagnosis of obesity and her BMI today is 37.58 [name] is in the action stage of change   ADVISE: [name] was educated on the multiple health risks of obesity as well as the benefit of weight loss to improve her health. She was advised  of the need for long term treatment and the importance of lifestyle modifications to improve her current health and to decrease her risk of future health problems.  AGREE: Multiple dietary modification options and treatment options were discussed and  [name] agreed to follow the recommendations documented in the above note.  ARRANGE: [name] was educated on the importance of frequent visits to treat obesity as outlined per CMS and USPSTF guidelines and agreed to schedule her next follow up appointment today.  Corey Skains, am acting as Location manager for [employer]. Owens Shark, DO  I have reviewed the above documentation for accuracy and completeness, and I agree with the  above. -Jearld Lesch, DO

## 2018-03-24 ENCOUNTER — Other Ambulatory Visit: Payer: Self-pay | Admitting: Physician Assistant

## 2018-04-11 ENCOUNTER — Telehealth: Payer: Self-pay

## 2018-04-11 ENCOUNTER — Other Ambulatory Visit: Payer: Self-pay | Admitting: Physician Assistant

## 2018-04-11 NOTE — Telephone Encounter (Signed)
Called her back- did not reach [facility] Does she have any of her usual hydrocodone on hand?  This can also be used to help with her cough, as hydrocodone is what is in most rx cough syrups  Let me know if anything further needed

## 2018-04-11 NOTE — Telephone Encounter (Signed)
Copied from [place] [phone]. Topic: General - Other >> Apr 11, 2018  4:09 PM Windy Kalata wrote: Reason for CRM: Patient was diagnosed yesterday at work with Type A Influenza by the nurse, she is asking if she can get a cough syrup, she already has Prednisone from previous illness and the nurse stated that she is past being able to take Tamiflu. She also has a fever.  Best call back is [phone]

## 2018-04-12 ENCOUNTER — Ambulatory Visit (INDEPENDENT_AMBULATORY_CARE_PROVIDER_SITE_OTHER): Payer: BLUE CROSS/BLUE SHIELD | Admitting: Bariatrics

## 2018-04-15 ENCOUNTER — Other Ambulatory Visit: Payer: Self-pay

## 2018-04-15 ENCOUNTER — Emergency Department (HOSPITAL_BASED_OUTPATIENT_CLINIC_OR_DEPARTMENT_OTHER): Payer: BLUE CROSS/BLUE SHIELD

## 2018-04-15 ENCOUNTER — Encounter (HOSPITAL_BASED_OUTPATIENT_CLINIC_OR_DEPARTMENT_OTHER): Payer: Self-pay | Admitting: *Deleted

## 2018-04-15 ENCOUNTER — Emergency Department (HOSPITAL_BASED_OUTPATIENT_CLINIC_OR_DEPARTMENT_OTHER)
Admission: EM | Admit: 2018-04-15 | Discharge: 2018-04-15 | Disposition: A | Discharge status: Home / Self Care | Payer: BLUE CROSS/BLUE SHIELD | Attending: Emergency Medicine | Admitting: Emergency Medicine

## 2018-04-15 DIAGNOSIS — Z96652 Presence of left artificial knee joint: Secondary | ICD-10-CM | POA: Diagnosis not present

## 2018-04-15 DIAGNOSIS — Z79899 Other long term (current) drug therapy: Secondary | ICD-10-CM | POA: Insufficient documentation

## 2018-04-15 DIAGNOSIS — R05 Cough: Secondary | ICD-10-CM | POA: Insufficient documentation

## 2018-04-15 DIAGNOSIS — J4520 Mild intermittent asthma, uncomplicated: Secondary | ICD-10-CM | POA: Insufficient documentation

## 2018-04-15 DIAGNOSIS — R059 Cough, unspecified: Secondary | ICD-10-CM

## 2018-04-15 DIAGNOSIS — R0602 Shortness of breath: Secondary | ICD-10-CM | POA: Diagnosis present

## 2018-04-15 LAB — CBC WITH DIFFERENTIAL/PLATELET
Abs Immature Granulocytes: 0.01 10*3/uL (ref 0.00–0.07)
Basophils Absolute: 0 10*3/uL (ref 0.0–0.1)
Basophils Relative: 1 %
EOS PCT: 1 %
Eosinophils Absolute: 0 10*3/uL (ref 0.0–0.5)
HCT: 39.9 % (ref 36.0–46.0)
Hemoglobin: 12.7 g/dL (ref 12.0–15.0)
Immature Granulocytes: 0 %
Lymphocytes Relative: 35 %
Lymphs Abs: 1.5 10*3/uL (ref 0.7–4.0)
MCH: 29.5 pg (ref 26.0–34.0)
MCHC: 31.8 g/dL (ref 30.0–36.0)
MCV: 92.8 fL (ref 80.0–100.0)
MONO ABS: 0.4 10*3/uL (ref 0.1–1.0)
Monocytes Relative: 10 %
Neutro Abs: 2.2 10*3/uL (ref 1.7–7.7)
Neutrophils Relative %: 53 %
Platelets: 218 10*3/uL (ref 150–400)
RBC: 4.3 MIL/uL (ref 3.87–5.11)
RDW: 13.4 % (ref 11.5–15.5)
WBC: 4.1 10*3/uL (ref 4.0–10.5)
nRBC: 0 % (ref 0.0–0.2)

## 2018-04-15 LAB — COMPREHENSIVE METABOLIC PANEL
ALT: 21 U/L (ref 0–44)
AST: 23 U/L (ref 15–41)
Albumin: 3.8 g/dL (ref 3.5–5.0)
Alkaline Phosphatase: 102 U/L (ref 38–126)
Anion gap: 10 (ref 5–15)
BUN: 18 mg/dL (ref 6–20)
CO2: 28 mmol/L (ref 22–32)
Calcium: 8.4 mg/dL — ABNORMAL LOW (ref 8.9–10.3)
Chloride: 98 mmol/L (ref 98–111)
Creatinine, Ser: 0.72 mg/dL (ref 0.44–1.00)
GFR calc Af Amer: >60 mL/min (ref >60)
GFR calc non Af Amer: >60 mL/min (ref >60)
Glucose, Bld: 92 mg/dL (ref 70–99)
Potassium: 3.6 mmol/L (ref 3.5–5.1)
Sodium: 136 mmol/L (ref 135–145)
Total Bilirubin: 0.5 mg/dL (ref 0.3–1.2)
Total Protein: 7.2 g/dL (ref 6.5–8.1)

## 2018-04-15 MED ORDER — AMOXICILLIN 500 MG PO CAPS: 1000.0000 mg | ORAL_CAPSULE | Freq: Two times a day (BID) | ORAL | 0 refills | Status: DC

## 2018-04-15 MED ORDER — AZITHROMYCIN 250 MG PO TABS
500.0000 mg | ORAL_TABLET | Freq: Once | ORAL | Status: AC
  Administered 2018-04-15: 500 mg via ORAL
  Filled 2018-04-15: qty 2

## 2018-04-15 MED ORDER — IPRATROPIUM-ALBUTEROL 0.5-2.5 (3) MG/3ML IN SOLN
3.0000 mL | Freq: Once | RESPIRATORY_TRACT | Status: AC
  Administered 2018-04-15: 3 mL via RESPIRATORY_TRACT
  Filled 2018-04-15: qty 3

## 2018-04-15 MED ORDER — SODIUM CHLORIDE 0.9 % IV SOLN
1.0000 g | Freq: Once | INTRAVENOUS | Status: AC
  Administered 2018-04-15: 1 g via INTRAVENOUS
  Filled 2018-04-15: qty 10

## 2018-04-15 MED ORDER — AZITHROMYCIN 250 MG PO TABS: 250.0000 mg | ORAL_TABLET | Freq: Every day | ORAL | 0 refills | Status: DC

## 2018-04-15 MED ORDER — MAGNESIUM SULFATE 2 GM/50ML IV SOLN
2.0000 g | Freq: Once | INTRAVENOUS | Status: AC
  Administered 2018-04-15: 2 g via INTRAVENOUS
  Filled 2018-04-15: qty 50

## 2018-04-15 NOTE — ED Triage Notes (Signed)
Congested cough 3-4 days. Dx with flu last Tuesday. Has been using breathing tx without relief

## 2018-04-15 NOTE — Discharge Instructions (Addendum)
It was my pleasure taking care of you today!   Please call your primary care doctor tomorrow to schedule a follow up appointment.   Take your antibiotics as directed until completed.   Return to ER for new or worsening symptoms, any additional concerns.

## 2018-04-15 NOTE — ED Notes (Signed)
[facility] wants to increase mag dose to complete now.

## 2018-04-15 NOTE — ED Provider Notes (Signed)
[facility] DEPART[facility] Provider Note   CSN: [id number] Arrival date & time: 04/15/18  1506     History   Chief Complaint Chief Complaint  Patient presents with  . Shortness of Breath    HPI [name] is a 58 y.o. female.  The history is provided by the patient and medical records. No language interpreter was used.  Shortness of Breath  Associated symptoms: cough, fever (Resolved) and wheezing     [name] is a 58 y.o. female  with a PMH as listed below including asthma and myasthenia gravis who presents to the Emergency Department complaining of cough and shortness of breath.  Patient states that she started having fever, generalized body aches, cough and shortness of breath on Tuesday.  She saw her primary care doctor and was started on Tamiflu.  She developed a fever the next today.  Since Friday, she has been afebrile.  Body aches resolved.  Unfortunately, she woke up yesterday with a worsening cough and more difficulty breathing.  Even talking, she is getting short of breath.  She has been using her inhaler and albuterol nebulizer treatment with little improvement.  She feels as if this helped initially, but is no longer helping.  She has already completed a prednisone taper and did not feel like it provided much relief.  She does not feel like steroids in the past of helped her much.  Denies any calf or lower extremity pain.  She has history of lymphedema, but has not appreciated any new leg swelling.  Denies any chest pain.    Past Medical History:  Diagnosis Date  . Abnormal liver function   . Anemia   . Arthritis    back & knees   . Asthma    uses albuterol inhaler if outside, cold weather & exercising   . Depression   . Dyspnea   . Fatty liver   . GERD (gastroesophageal reflux disease)   . History of hiatal hernia   . History of kidney stones    passed spontaneously  . IBS (irritable bowel syndrome)   . Iron deficiency anemia     . Joint pain   . Lactose intolerance   . Leg edema   . Melanoma ([place])    malignant, left leg,  wide excision, sentinel node, one positive stage 3, as a result- lymphdema, will be following with ONCOLOGY, PET scan- last 2017  . Myasthenia gravis ([place])   . Myasthenia gravis ([place]) 2000  . Osteoarthritis   . Pneumonia 2015   hosp. [facility] , treated with IV antibiotics   . Recurrent upper respiratory infection (URI)   . Sleep apnea    sleep irregular pattern- no CPAP order solidified yet.   Marland Kitchen Spinal stenosis   . Vitamin B deficiency   . Vitamin D deficiency     Patient Active Problem List   Diagnosis Date Noted  . Acute maxillary sinusitis 03/02/2018  . Mild intermittent asthma without complication 42/70/6237  . Class 3 severe obesity with serious comorbidity and body mass index (BMI) of 40.0 to 44.9 in adult ([place]) 12/27/2017  . Status post total knee replacement, left 12/18/2017  . 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 Vitamin D deficiency 10/24/2017  . Hyperglycemia 10/24/2017  . Other fatigue 10/24/2017  . IDA (iron deficiency anemia) 06/21/2017  . Spondylolisthesis at L3-L4 level 03/01/2017  . Allergy to environmental factors 09/15/2016  . Chronic low back pain 09/15/2016  . Degenerative spondylolisthesis 09/15/2016  . Depression  09/15/2016  . Leg edema 09/15/2016  . Lumbosacral spondylosis without myelopathy 09/15/2016  . Osteoarthritis of knees, bilateral 09/15/2016  . Pernicious anemia 09/15/2016  . 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 Malignant melanoma of thigh, left ([place]) Stage III 11/05/2013  . CERVICAL RADICULOPATHY, LEFT 06/04/2008  . MEDIAL EPICONDYLITIS 06/04/2008  . ANEMIA-NOS 03/25/2008  . Other allergic rhinitis 03/25/2008  . Asthma 03/25/2008  . FATIGUE 03/25/2008  . B12 DEFICIENCY 06/04/2007  . RESTLESS LEG SYNDROME 06/04/2007  . MYASTHENIA GRAVIS WITHOUT EXACERBATION 06/04/2007  . GERD 06/04/2007  . IRRITABLE BOWEL  SYNDROME 06/04/2007    Past Surgical History:  Procedure Laterality Date  . APPENDECTOMY    . BACK SURGERY    . BREAST SURGERY Bilateral 2004   reduction   . EYE SURGERY Bilateral 1999   corneal peel  . MELANOMA EXCISION Left    removed from left leg  . OOPHORECTOMY Left   . THYMECTOMY  2001  . TOTAL KNEE ARTHROPLASTY Left 01/2016  . WRIST SURGERY Left    plate & 6 screws      OB History    Gravida  3   Para      Term      Preterm      AB      Living        SAB      TAB      Ectopic      Multiple      Live Births               Home Medications    Prior to Admission medications   Medication Sig Start Date End Date Taking? Authorizing Provider  albuterol (PROVENTIL HFA;VENTOLIN HFA) 108 (90 Base) MCG/ACT inhaler Inhale 1 puff into the lungs every 6 (six) hours as needed for wheezing or shortness of breath. 05/17/17   Elsie Stain, MD  amoxicillin (AMOXIL) 500 MG capsule Take 2 capsules (1,000 mg total) by mouth 2 (two) times daily. 04/15/18   Ward, Ozella Almond, PA-C  azithromycin (ZITHROMAX) 250 MG tablet Take 1 tablet (250 mg total) by mouth daily. Starting 2/17. You received your first dose in the ER tonight. 04/15/18   Ward, Ozella Almond, PA-C  buPROPion (WELLBUTRIN SR) 200 MG 12 hr tablet Take 1 tablet (200 mg total) by mouth daily. 03/15/18   Jearld Lesch A, DO  Cholecalciferol (VITAMIN D3) 1.25 MG (50000 UT) CAPS TAKE 1 CAPSULE BY MOUTH ONE TIME PER WEEK 02/15/18   Jearld Lesch A, DO  Fluticasone Propionate (XHANCE) 93 MCG/ACT EXHU Place 2 puffs into the nose 2 (two) times daily. 01/24/18   Garnet Sierras, DO  HYDROcodone-acetaminophen (NORCO/VICODIN) 5-325 MG tablet Take 1 tablet by mouth every 6 (six) hours as needed for moderate pain. 02/23/18 02/23/19  Copland, Gay Filler, MD  ipratropium (ATROVENT) 0.03 % nasal spray Place 2 sprays into both nostrils 4 (four) times daily. 10/09/17   Copland, Gay Filler, MD  metFORMIN (GLUCOPHAGE) 500 MG tablet Take  1 tablet (500 mg total) by mouth daily with breakfast. 03/15/18   Jearld Lesch A, DO  montelukast (SINGULAIR) 10 MG tablet Take 1 tablet (10 mg total) by mouth at bedtime. 01/24/18   Garnet Sierras, DO  pantoprazole (PROTONIX) 40 MG tablet Take 1 tablet by mouth daily. 03/26/18   Ester[place], Amy S, PA-C  pramipexole (MIRAPEX) 1.5 MG tablet TAKE 2 TABLETS (3 MG TOTAL) BY MOUTH 2 (TWO) TIMES DAILY AS NEEDED (FOR RESTLESS  LEG SYNDROME 03/13/18   [place], Holli Humbles, NP  topiramate (TOPAMAX) 50 MG tablet Take 1 tablet (50 mg total) by mouth daily. 03/15/18   Jearld Lesch A, DO  triamcinolone cream (KENALOG) 0.1 % Apply 1 application topically 2 (two) times daily. 08/23/17   Marletta Lor, MD  Vitamin D, Ergocalciferol, (DRISDOL) 1.25 MG (50000 UT) CAPS capsule Take 1 capsule (50,000 Units total) by mouth every 7 (seven) days. 03/15/18   Georgia Lopes, DO    Family History Family History  Problem Relation Age of Onset  . COPD Mother   . Asthma Mother   . Allergic rhinitis Mother   . Lung cancer Father   . Diverticulitis Father   . Sleep apnea Father   . Asthma Father   . Allergic rhinitis Father   . Colitis Neg Hx   . Urticaria Neg Hx   . Eczema Neg Hx     Social History Social History   Tobacco Use  . Smoking status: Never Smoker  . Smokeless tobacco: Never Used  Substance Use Topics  . Alcohol use: Yes    Alcohol/week: 1.0 standard drinks    Types: 1 Standard drinks or equivalent per week    Comment: occassionally  . Drug use: No     Allergies   Other and Tape   Review of Systems Review of Systems  Constitutional: Positive for fever (Resolved).  Respiratory: Positive for cough, shortness of breath and wheezing.   Musculoskeletal: Positive for myalgias (Resolved).  All other systems reviewed and are negative.    Physical Exam Updated Vital Signs BP 120/62 (BP Location: Left Arm)   Pulse 78   Temp 98.2 F (36.8 C) (Oral)   Resp 18   Ht 5\' 7"  (1.702 m)   Wt 110.2  kg   SpO2 99%   BMI 38.06 kg/m   Physical Exam Vitals signs and nursing note reviewed.  Constitutional:      General: She is not in acute distress.    Appearance: She is well-developed.  HENT:     Head: Normocephalic and atraumatic.  Neck:     Musculoskeletal: Neck supple.  Cardiovascular:     Rate and Rhythm: Normal rate and regular rhythm.     Heart sounds: Normal heart sounds. No murmur.  Pulmonary:     Effort: Pulmonary effort is normal. No respiratory distress.     Comments: Harsh expiratory wheezing bilaterally. Abdominal:     General: There is no distension.     Palpations: Abdomen is soft.     Tenderness: There is no abdominal tenderness.  Skin:    General: Skin is warm and dry.  Neurological:     Mental Status: She is alert and oriented to person, place, and time.      ED Treatments / Results  Labs (all labs ordered are listed, but only abnormal results are displayed) Labs Reviewed  COMPREHENSIVE METABOLIC PANEL - Abnormal; Notable for the following components:      Result Value   Calcium 8.4 (*)    All other components within normal limits  CBC WITH DIFFERENTIAL/PLATELET    EKG None  Radiology Dg Chest 2 View  Result Date: 04/15/2018 CLINICAL DATA:  Cough. EXAM: CHEST - 2 VIEW COMPARISON:  August 11, 2017 FINDINGS: The heart size and mediastinal contours are within normal limits. Both lungs are clear. The visualized skeletal structures are unremarkable. IMPRESSION: No active cardiopulmonary disease. Electronically Signed   By: Dorise Bullion III M.D  On: 04/15/2018 16:10    Procedures Procedures (including critical care time)  Medications Ordered in ED Medications  ipratropium-albuterol (DUONEB) 0.5-2.5 (3) MG/3ML nebulizer solution 3 mL (3 mLs Nebulization Given 04/15/18 1558)  magnesium sulfate IVPB 2 g 50 mL ( Intravenous Stopped 04/15/18 1750)  cefTRIAXone (ROCEPHIN) 1 g in sodium chloride 0.9 % 100 mL IVPB ( Intravenous Stopped 04/15/18 1821)    azithromycin (ZITHROMAX) tablet 500 mg (500 mg Oral Given 04/15/18 1726)     Initial Impression / Assessment and Plan / ED Course  I have reviewed the triage vital signs and the nursing notes.  Pertinent labs & imaging results that were available during my care of the patient were reviewed by me and considered in my medical decision making (see chart for details).    Aashna Matson is a 58 y.o. female who presents to ED for worsening cough and difficulty breathing.  She was diagnosed with the flu on Tuesday.  She felt as if her symptoms were improved on Friday.  She did not have any fever or body aches Friday.  She developed worsening cough and trouble breathing yesterday.  Labs reviewed and reassuring.  Has harsh expiratory wheezing on exam.  She has already had steroid taper and does not want any other steroid treatment.  Given neb treatment and magnesium.  Chest x-ray does not show any acute disease or pneumonia, however given her history of improvement with acute worsening as well as her lung sounds and overall clinical picture, concern for post influenza pneumonia.  We will start her on antibiotics and have her follow-up with her primary care doctor for reevaluation.  She was able to ambulate in the emergency department without becoming hypoxic.  She tolerated this quite well.  Reasons to return to the emergency department were discussed and all questions answered.  Patient discussed with Dr. Venora Maples who agrees with treatment plan.    Final Clinical Impressions(s) / ED Diagnoses   Final diagnoses:  Cough    ED Discharge Orders         Ordered    azithromycin (ZITHROMAX) 250 MG tablet  Daily     04/15/18 1834    amoxicillin (AMOXIL) 500 MG capsule  2 times daily     04/15/18 1834           Ward, Ozella Almond, PA-C 04/15/18 1903    Jola Schmidt, MD 04/16/18 [phone]

## 2018-04-15 NOTE — Progress Notes (Signed)
RT note:RT walked patient around Ed on room air. Patients sats stayed between 94-96% on room air.HR 101-110.

## 2018-04-15 NOTE — ED Notes (Signed)
ED Provider at bedside. 

## 2018-04-17 ENCOUNTER — Ambulatory Visit: Payer: Self-pay

## 2018-04-17 ENCOUNTER — Inpatient Hospital Stay (HOSPITAL_BASED_OUTPATIENT_CLINIC_OR_DEPARTMENT_OTHER)
Admission: EM | Admit: 2018-04-17 | Discharge: 2018-04-19 | DRG: 202 | Disposition: A | Discharge status: Home / Self Care | Payer: BLUE CROSS/BLUE SHIELD | Attending: Internal Medicine | Admitting: Internal Medicine

## 2018-04-17 ENCOUNTER — Other Ambulatory Visit: Payer: Self-pay

## 2018-04-17 ENCOUNTER — Emergency Department (HOSPITAL_BASED_OUTPATIENT_CLINIC_OR_DEPARTMENT_OTHER): Payer: BLUE CROSS/BLUE SHIELD

## 2018-04-17 ENCOUNTER — Encounter (HOSPITAL_BASED_OUTPATIENT_CLINIC_OR_DEPARTMENT_OTHER): Payer: Self-pay | Admitting: *Deleted

## 2018-04-17 DIAGNOSIS — J4521 Mild intermittent asthma with (acute) exacerbation: Secondary | ICD-10-CM

## 2018-04-17 DIAGNOSIS — J9601 Acute respiratory failure with hypoxia: Secondary | ICD-10-CM | POA: Diagnosis present

## 2018-04-17 DIAGNOSIS — K589 Irritable bowel syndrome without diarrhea: Secondary | ICD-10-CM | POA: Diagnosis present

## 2018-04-17 DIAGNOSIS — E119 Type 2 diabetes mellitus without complications: Secondary | ICD-10-CM | POA: Diagnosis present

## 2018-04-17 DIAGNOSIS — Z6837 Body mass index (BMI) 37.0-37.9, adult: Secondary | ICD-10-CM

## 2018-04-17 DIAGNOSIS — Z91048 Other nonmedicinal substance allergy status: Secondary | ICD-10-CM | POA: Diagnosis not present

## 2018-04-17 DIAGNOSIS — Z801 Family history of malignant neoplasm of trachea, bronchus and lung: Secondary | ICD-10-CM | POA: Diagnosis not present

## 2018-04-17 DIAGNOSIS — Z85820 Personal history of malignant melanoma of skin: Secondary | ICD-10-CM | POA: Diagnosis not present

## 2018-04-17 DIAGNOSIS — M1711 Unilateral primary osteoarthritis, right knee: Secondary | ICD-10-CM | POA: Diagnosis present

## 2018-04-17 DIAGNOSIS — Z7984 Long term (current) use of oral hypoglycemic drugs: Secondary | ICD-10-CM

## 2018-04-17 DIAGNOSIS — G2581 Restless legs syndrome: Secondary | ICD-10-CM | POA: Diagnosis present

## 2018-04-17 DIAGNOSIS — R9431 Abnormal electrocardiogram [ECG] [EKG]: Secondary | ICD-10-CM | POA: Diagnosis not present

## 2018-04-17 DIAGNOSIS — G4730 Sleep apnea, unspecified: Secondary | ICD-10-CM | POA: Diagnosis present

## 2018-04-17 DIAGNOSIS — M479 Spondylosis, unspecified: Secondary | ICD-10-CM | POA: Diagnosis present

## 2018-04-17 DIAGNOSIS — Z7951 Long term (current) use of inhaled steroids: Secondary | ICD-10-CM | POA: Diagnosis not present

## 2018-04-17 DIAGNOSIS — K449 Diaphragmatic hernia without obstruction or gangrene: Secondary | ICD-10-CM | POA: Diagnosis present

## 2018-04-17 DIAGNOSIS — K219 Gastro-esophageal reflux disease without esophagitis: Secondary | ICD-10-CM | POA: Diagnosis present

## 2018-04-17 DIAGNOSIS — E669 Obesity, unspecified: Secondary | ICD-10-CM | POA: Diagnosis present

## 2018-04-17 DIAGNOSIS — G7000 Myasthenia gravis without (acute) exacerbation: Secondary | ICD-10-CM | POA: Diagnosis present

## 2018-04-17 DIAGNOSIS — J45901 Unspecified asthma with (acute) exacerbation: Secondary | ICD-10-CM | POA: Diagnosis not present

## 2018-04-17 DIAGNOSIS — E559 Vitamin D deficiency, unspecified: Secondary | ICD-10-CM | POA: Diagnosis present

## 2018-04-17 DIAGNOSIS — R0781 Pleurodynia: Secondary | ICD-10-CM | POA: Diagnosis present

## 2018-04-17 DIAGNOSIS — J9621 Acute and chronic respiratory failure with hypoxia: Secondary | ICD-10-CM | POA: Diagnosis not present

## 2018-04-17 DIAGNOSIS — Z96652 Presence of left artificial knee joint: Secondary | ICD-10-CM | POA: Diagnosis present

## 2018-04-17 DIAGNOSIS — Z825 Family history of asthma and other chronic lower respiratory diseases: Secondary | ICD-10-CM

## 2018-04-17 LAB — BASIC METABOLIC PANEL
Anion gap: 10 (ref 5–15)
BUN: 14 mg/dL (ref 6–20)
CO2: 26 mmol/L (ref 22–32)
Calcium: 8.6 mg/dL — ABNORMAL LOW (ref 8.9–10.3)
Chloride: 104 mmol/L (ref 98–111)
Creatinine, Ser: 0.65 mg/dL (ref 0.44–1.00)
GFR calc Af Amer: >60 mL/min (ref >60)
GFR calc non Af Amer: >60 mL/min (ref >60)
GLUCOSE: 110 mg/dL — AB (ref 70–99)
Potassium: 3.9 mmol/L (ref 3.5–5.1)
Sodium: 140 mmol/L (ref 135–145)

## 2018-04-17 LAB — MRSA PCR SCREENING: MRSA by PCR: NEGATIVE

## 2018-04-17 LAB — RESPIRATORY PANEL BY PCR
Adenovirus: NOT DETECTED
Bordetella pertussis: NOT DETECTED
CORONAVIRUS 229E-RVPPCR: NOT DETECTED
CORONAVIRUS HKU1-RVPPCR: NOT DETECTED
Chlamydophila pneumoniae: NOT DETECTED
Coronavirus NL63: NOT DETECTED
Coronavirus OC43: NOT DETECTED
Influenza A: NOT DETECTED
Influenza B: NOT DETECTED
Metapneumovirus: NOT DETECTED
Mycoplasma pneumoniae: NOT DETECTED
PARAINFLUENZA VIRUS 1-RVPPCR: NOT DETECTED
Parainfluenza Virus 2: NOT DETECTED
Parainfluenza Virus 3: NOT DETECTED
Parainfluenza Virus 4: NOT DETECTED
Respiratory Syncytial Virus: NOT DETECTED
Rhinovirus / Enterovirus: NOT DETECTED

## 2018-04-17 LAB — CBC WITH DIFFERENTIAL/PLATELET
ABS IMMATURE GRANULOCYTES: 0.02 10*3/uL (ref 0.00–0.07)
Basophils Absolute: 0 10*3/uL (ref 0.0–0.1)
Basophils Relative: 0 %
Eosinophils Absolute: 0.1 10*3/uL (ref 0.0–0.5)
Eosinophils Relative: 1 %
HCT: 40.4 % (ref 36.0–46.0)
Hemoglobin: 13 g/dL (ref 12.0–15.0)
Immature Granulocytes: 0 %
Lymphocytes Relative: 31 %
Lymphs Abs: 2.4 10*3/uL (ref 0.7–4.0)
MCH: 29.7 pg (ref 26.0–34.0)
MCHC: 32.2 g/dL (ref 30.0–36.0)
MCV: 92.4 fL (ref 80.0–100.0)
Monocytes Absolute: 0.6 10*3/uL (ref 0.1–1.0)
Monocytes Relative: 7 %
Neutro Abs: 4.5 10*3/uL (ref 1.7–7.7)
Neutrophils Relative %: 61 %
Platelets: 228 10*3/uL (ref 150–400)
RBC: 4.37 MIL/uL (ref 3.87–5.11)
RDW: 13.3 % (ref 11.5–15.5)
Smear Review: NORMAL
WBC: 7.5 10*3/uL (ref 4.0–10.5)
nRBC: 0 % (ref 0.0–0.2)

## 2018-04-17 LAB — GLUCOSE, CAPILLARY: Glucose-Capillary: 308 mg/dL — ABNORMAL HIGH (ref 70–99)

## 2018-04-17 MED ORDER — ALBUTEROL (5 MG/ML) CONTINUOUS INHALATION SOLN
15.0000 mg/h | INHALATION_SOLUTION | Freq: Once | RESPIRATORY_TRACT | Status: AC
  Administered 2018-04-17: 15 mg/h via RESPIRATORY_TRACT
  Filled 2018-04-17: qty 20

## 2018-04-17 MED ORDER — HYDROCODONE-ACETAMINOPHEN 5-325 MG PO TABS
1.0000 | ORAL_TABLET | Freq: Four times a day (QID) | ORAL | Status: DC | PRN
  Administered 2018-04-17 – 2018-04-19 (×5): 1 via ORAL
  Filled 2018-04-17 (×5): qty 1

## 2018-04-17 MED ORDER — ALBUTEROL SULFATE (2.5 MG/3ML) 0.083% IN NEBU
2.5000 mg | INHALATION_SOLUTION | Freq: Once | RESPIRATORY_TRACT | Status: AC
  Administered 2018-04-17: 2.5 mg via RESPIRATORY_TRACT
  Filled 2018-04-17: qty 3

## 2018-04-17 MED ORDER — PANTOPRAZOLE SODIUM 40 MG PO TBEC
40.0000 mg | DELAYED_RELEASE_TABLET | Freq: Every day | ORAL | Status: DC
  Administered 2018-04-17 – 2018-04-19 (×3): 40 mg via ORAL
  Filled 2018-04-17 (×3): qty 1

## 2018-04-17 MED ORDER — IPRATROPIUM-ALBUTEROL 0.5-2.5 (3) MG/3ML IN SOLN
3.0000 mL | Freq: Four times a day (QID) | RESPIRATORY_TRACT | Status: DC
  Administered 2018-04-17 – 2018-04-18 (×6): 3 mL via RESPIRATORY_TRACT
  Filled 2018-04-17 (×5): qty 3

## 2018-04-17 MED ORDER — HYDROCODONE-HOMATROPINE 5-1.5 MG/5ML PO SYRP
5.0000 mL | ORAL_SOLUTION | Freq: Four times a day (QID) | ORAL | Status: DC | PRN
  Administered 2018-04-17: 5 mL via ORAL
  Filled 2018-04-17: qty 5

## 2018-04-17 MED ORDER — ENOXAPARIN SODIUM 40 MG/0.4ML ~~LOC~~ SOLN
40.0000 mg | SUBCUTANEOUS | Status: DC
  Administered 2018-04-17 – 2018-04-18 (×2): 40 mg via SUBCUTANEOUS
  Filled 2018-04-17 (×2): qty 0.4

## 2018-04-17 MED ORDER — IPRATROPIUM-ALBUTEROL 0.5-2.5 (3) MG/3ML IN SOLN
3.0000 mL | Freq: Once | RESPIRATORY_TRACT | Status: AC
  Administered 2018-04-17: 3 mL via RESPIRATORY_TRACT
  Filled 2018-04-17: qty 3

## 2018-04-17 MED ORDER — METHYLPREDNISOLONE SODIUM SUCC 125 MG IJ SOLR
60.0000 mg | Freq: Four times a day (QID) | INTRAMUSCULAR | Status: DC
  Administered 2018-04-17 – 2018-04-18 (×3): 60 mg via INTRAVENOUS
  Filled 2018-04-17 (×3): qty 2

## 2018-04-17 MED ORDER — SODIUM CHLORIDE 0.9 % IV SOLN
INTRAVENOUS | Status: DC | PRN
  Administered 2018-04-17: 12:00:00 via INTRAVENOUS

## 2018-04-17 MED ORDER — INSULIN ASPART 100 UNIT/ML ~~LOC~~ SOLN
0.0000 [IU] | Freq: Three times a day (TID) | SUBCUTANEOUS | Status: DC
  Administered 2018-04-18: 3 [IU] via SUBCUTANEOUS
  Administered 2018-04-18: 2 [IU] via SUBCUTANEOUS
  Administered 2018-04-18: 5 [IU] via SUBCUTANEOUS
  Administered 2018-04-19: 2 [IU] via SUBCUTANEOUS

## 2018-04-17 MED ORDER — MONTELUKAST SODIUM 10 MG PO TABS
10.0000 mg | ORAL_TABLET | Freq: Every day | ORAL | Status: DC
  Administered 2018-04-17 – 2018-04-18 (×2): 10 mg via ORAL
  Filled 2018-04-17 (×2): qty 1

## 2018-04-17 MED ORDER — MAGNESIUM SULFATE 2 GM/50ML IV SOLN
2.0000 g | Freq: Once | INTRAVENOUS | Status: AC
  Administered 2018-04-17: 2 g via INTRAVENOUS
  Filled 2018-04-17: qty 50

## 2018-04-17 MED ORDER — METHYLPREDNISOLONE SODIUM SUCC 125 MG IJ SOLR
125.0000 mg | Freq: Once | INTRAMUSCULAR | Status: AC
  Administered 2018-04-17: 125 mg via INTRAVENOUS
  Filled 2018-04-17: qty 2

## 2018-04-17 MED ORDER — TIZANIDINE HCL 4 MG PO TABS
4.0000 mg | ORAL_TABLET | Freq: Every day | ORAL | Status: DC
  Administered 2018-04-17 – 2018-04-18 (×2): 4 mg via ORAL
  Filled 2018-04-17 (×2): qty 1

## 2018-04-17 MED ORDER — LEVALBUTEROL HCL 1.25 MG/0.5ML IN NEBU: 1.2500 mg | INHALATION_SOLUTION | Freq: Four times a day (QID) | RESPIRATORY_TRACT | Status: DC | PRN

## 2018-04-17 MED ORDER — TOPIRAMATE 25 MG PO TABS
50.0000 mg | ORAL_TABLET | Freq: Every day | ORAL | Status: DC
  Administered 2018-04-17 – 2018-04-19 (×3): 50 mg via ORAL
  Filled 2018-04-17 (×3): qty 2

## 2018-04-17 MED ORDER — BUPROPION HCL ER (SR) 100 MG PO TB12
200.0000 mg | ORAL_TABLET | Freq: Every day | ORAL | Status: DC
  Administered 2018-04-17 – 2018-04-19 (×3): 200 mg via ORAL
  Filled 2018-04-17 (×3): qty 2

## 2018-04-17 MED ORDER — PRAMIPEXOLE DIHYDROCHLORIDE 1 MG PO TABS
3.0000 mg | ORAL_TABLET | Freq: Two times a day (BID) | ORAL | Status: DC | PRN
  Administered 2018-04-18: 3 mg via ORAL
  Filled 2018-04-17 (×3): qty 3

## 2018-04-17 NOTE — ED Provider Notes (Signed)
[facility] EMERGENCY DEPARTMENT Provider Note   CSN: [id number] Arrival date & time: 04/17/18  1000    History   Chief Complaint Chief Complaint  Patient presents with  . Shortness of Breath    HPI [name] is a 58 y.o. female with a hx of myasthenia gravis, asthma, anemia, sleep apnea, and melanoma s/p resection in remission who returns to the ER for worsening cough, dyspnea, & wheezing. Patient seen by PCP 1 week prior, diagnosed w/ influenza, and placed on tamiflu & steroid taper. States she completed these medicines w/ improvement of some of her sxs, but that cough/dyspnea/wheezing worsened, she did not feel the steroids she had helped. She was seen in the ED for sxs 04/15/18 & did not want any additional steroids, ultimately was discharged home with Azithromycin/Amoxicillin for tx of post-influenza pneumonia w/ instructions to continue utilizing albuterol at home. Patient states she is taking medications as prescribed & using her neb tx without relief of her sxs. She feels her wheezing/dyspnea/cough have worsened. Her chest is sore from coughing so much. Denies fever, chills, congestion, sore throat, vomiting, new leg pain/swelling, hemoptysis, recent surgery/trauma, recent long travel, hormone use, personal hx of cancer, or hx of DVT/PE.   She does note that sxs feel similar to prior respiratory infections which have lead to complications with her myasthenia gravis requiring hospitalization. Most recently 5 years prior. She has not required intubation in the past. She does not take medications for myasthenia.     HPI  Past Medical History:  Diagnosis Date  . Abnormal liver function   . Anemia   . Arthritis    back & knees   . Asthma    uses albuterol inhaler if outside, cold weather & exercising   . Depression   . Dyspnea   . Fatty liver   . GERD (gastroesophageal reflux disease)   . History of hiatal hernia   . History of kidney stones    passed  spontaneously  . IBS (irritable bowel syndrome)   . Iron deficiency anemia   . Joint pain   . Lactose intolerance   . Leg edema   . Melanoma ([place])    malignant, left leg,  wide excision, sentinel node, one positive stage 3, as a result- lymphdema, will be following with ONCOLOGY, PET scan- last 2017  . Myasthenia gravis ([place])   . Myasthenia gravis ([place]) 2000  . Osteoarthritis   . Pneumonia 2015   hosp. [facility] , treated with IV antibiotics   . Recurrent upper respiratory infection (URI)   . Sleep apnea    sleep irregular pattern- no CPAP order solidified yet.   Marland Kitchen Spinal stenosis   . Vitamin B deficiency   . Vitamin D deficiency     Patient Active Problem List   Diagnosis Date Noted  . Acute maxillary sinusitis 03/02/2018  . Mild intermittent asthma without complication 56/38/7564  . Class 3 severe obesity with serious comorbidity and body mass index (BMI) of 40.0 to 44.9 in adult ([place]) 12/27/2017  . Status post total knee replacement, left 12/18/2017  . 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 Vitamin D deficiency 10/24/2017  . Hyperglycemia 10/24/2017  . Other fatigue 10/24/2017  . IDA (iron deficiency anemia) 06/21/2017  . Spondylolisthesis at L3-L4 level 03/01/2017  . Allergy to environmental factors 09/15/2016  . Chronic low back pain 09/15/2016  . Degenerative spondylolisthesis 09/15/2016  . Depression 09/15/2016  . Leg edema 09/15/2016  . Lumbosacral spondylosis without myelopathy 09/15/2016  .  Osteoarthritis of knees, bilateral 09/15/2016  . Pernicious anemia 09/15/2016  . 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 Malignant melanoma of thigh, left ([place]) Stage III 11/05/2013  . CERVICAL RADICULOPATHY, LEFT 06/04/2008  . MEDIAL EPICONDYLITIS 06/04/2008  . ANEMIA-NOS 03/25/2008  . Other allergic rhinitis 03/25/2008  . Asthma 03/25/2008  . FATIGUE 03/25/2008  . B12 DEFICIENCY 06/04/2007  . RESTLESS LEG SYNDROME 06/04/2007  . MYASTHENIA  GRAVIS WITHOUT EXACERBATION 06/04/2007  . GERD 06/04/2007  . IRRITABLE BOWEL SYNDROME 06/04/2007    Past Surgical History:  Procedure Laterality Date  . APPENDECTOMY    . BACK SURGERY    . BREAST SURGERY Bilateral 2004   reduction   . EYE SURGERY Bilateral 1999   corneal peel  . MELANOMA EXCISION Left    removed from left leg  . OOPHORECTOMY Left   . THYMECTOMY  2001  . TOTAL KNEE ARTHROPLASTY Left 01/2016  . WRIST SURGERY Left    plate & 6 screws      OB History    Gravida  3   Para      Term      Preterm      AB      Living        SAB      TAB      Ectopic      Multiple      Live Births               Home Medications    Prior to Admission medications   Medication Sig Start Date End Date Taking? Authorizing Provider  albuterol (PROVENTIL HFA;VENTOLIN HFA) 108 (90 Base) MCG/ACT inhaler Inhale 1 puff into the lungs every 6 (six) hours as needed for wheezing or shortness of breath. 05/17/17   Elsie Stain, MD  amoxicillin (AMOXIL) 500 MG capsule Take 2 capsules (1,000 mg total) by mouth 2 (two) times daily. 04/15/18   Ward, Ozella Almond, PA-C  ATROVENT HFA 17 MCG/ACT inhaler  04/10/18   [provider]  azithromycin (ZITHROMAX) 250 MG tablet Take 1 tablet (250 mg total) by mouth daily. Starting 2/17. You received your first dose in the ER tonight. 04/15/18   Ward, Ozella Almond, PA-C  buPROPion (WELLBUTRIN SR) 200 MG 12 hr tablet Take 1 tablet (200 mg total) by mouth daily. 03/15/18   Jearld Lesch A, DO  Cholecalciferol (VITAMIN D3) 1.25 MG (50000 UT) CAPS TAKE 1 CAPSULE BY MOUTH ONE TIME PER WEEK 02/15/18   Jearld Lesch A, DO  EPINEPHrine 0.3 mg/0.3 mL IJ SOAJ injection  02/22/18   [provider]  Fluticasone Propionate (XHANCE) 93 MCG/ACT EXHU Place 2 puffs into the nose 2 (two) times daily. 01/24/18   Garnet Sierras, DO  HYDROcodone-acetaminophen (NORCO/VICODIN) 5-325 MG tablet Take 1 tablet by mouth every 6 (six) hours as needed for  moderate pain. 02/23/18 02/23/19  Copland, Gay Filler, MD  ipratropium (ATROVENT) 0.03 % nasal spray Place 2 sprays into both nostrils 4 (four) times daily. 10/09/17   Copland, Gay Filler, MD  metFORMIN (GLUCOPHAGE) 500 MG tablet Take 1 tablet (500 mg total) by mouth daily with breakfast. 03/15/18   Jearld Lesch A, DO  montelukast (SINGULAIR) 10 MG tablet Take 1 tablet (10 mg total) by mouth at bedtime. 01/24/18   Garnet Sierras, DO  pantoprazole (PROTONIX) 40 MG tablet Take 1 tablet by mouth daily. 03/26/18   Esterwood, Amy S, PA-C  pramipexole (MIRAPEX) 1.5 MG tablet TAKE 2 TABLETS (  3 MG TOTAL) BY MOUTH 2 (TWO) TIMES DAILY AS NEEDED (FOR RESTLESS LEG SYNDROME 03/13/18   [place], Holli Humbles, NP  topiramate (TOPAMAX) 50 MG tablet Take 1 tablet (50 mg total) by mouth daily. 03/15/18   Jearld Lesch A, DO  triamcinolone cream (KENALOG) 0.1 % Apply 1 application topically 2 (two) times daily. 08/23/17   Marletta Lor, MD  Vitamin D, Ergocalciferol, (DRISDOL) 1.25 MG (50000 UT) CAPS capsule Take 1 capsule (50,000 Units total) by mouth every 7 (seven) days. 03/15/18   Georgia Lopes, DO    Family History Family History  Problem Relation Age of Onset  . COPD Mother   . Asthma Mother   . Allergic rhinitis Mother   . Lung cancer Father   . Diverticulitis Father   . Sleep apnea Father   . Asthma Father   . Allergic rhinitis Father   . Colitis Neg Hx   . Urticaria Neg Hx   . Eczema Neg Hx     Social History Social History   Tobacco Use  . Smoking status: Never Smoker  . Smokeless tobacco: Never Used  Substance Use Topics  . Alcohol use: Yes    Alcohol/week: 1.0 standard drinks    Types: 1 Standard drinks or equivalent per week    Comment: occassionally  . Drug use: No     Allergies   Other and Tape   Review of Systems Review of Systems  Constitutional: Negative for chills and fever.  Respiratory: Positive for cough, shortness of breath and wheezing.   Cardiovascular: Positive for  chest pain. Negative for leg swelling.  Gastrointestinal: Negative for abdominal pain and vomiting.  Genitourinary: Negative for dysuria.  Neurological: Negative for syncope.  All other systems reviewed and are negative.    Physical Exam Updated Vital Signs BP (!) 152/93 (BP Location: Left Arm)   Pulse (!) 104   Temp (!) 97.4 F (36.3 C) (Oral)   Resp 18   Ht 5\' 7"  (1.702 m)   Wt 110.2 kg   SpO2 98%   BMI 38.06 kg/m   Physical Exam Vitals signs and nursing note reviewed.  Constitutional:      General: She is not in acute distress.    Appearance: She is well-developed.  HENT:     Head: Normocephalic and atraumatic.     Right Ear: Tympanic membrane is not perforated, erythematous, retracted or bulging.     Left Ear: Tympanic membrane is not perforated, erythematous, retracted or bulging.     Mouth/Throat:     Pharynx: Uvula midline. No oropharyngeal exudate or posterior oropharyngeal erythema.  Eyes:     General:        Right eye: No discharge.        Left eye: No discharge.     Conjunctiva/sclera: Conjunctivae normal.     Pupils: Pupils are equal, round, and reactive to light.  Neck:     Musculoskeletal: Normal range of motion and neck supple.  Cardiovascular:     Rate and Rhythm: Regular rhythm. Tachycardia present.     Heart sounds: No murmur.  Pulmonary:     Effort: Tachypnea present. No accessory muscle usage.     Breath sounds: Wheezing (mild inspiratory, harsh expiratory wheeze throughout) present.  Chest:     Chest wall: Tenderness (anterior chest wall without overlying skin changes or palpable crepitus/deformity) present.  Abdominal:     General: There is no distension.     Palpations: Abdomen is soft.  Tenderness: There is no abdominal tenderness.  Lymphadenopathy:     Cervical: No cervical adenopathy.  Skin:    General: Skin is warm and dry.     Findings: No rash.  Neurological:     Mental Status: She is alert.  Psychiatric:        Behavior:  Behavior normal.    ED Treatments / Results  Labs (all labs ordered are listed, but only abnormal results are displayed) Labs Reviewed  BASIC METABOLIC PANEL - Abnormal; Notable for the following components:      Result Value   Glucose, Bld 110 (*)    Calcium 8.6 (*)    All other components within normal limits  RESPIRATORY PANEL BY PCR  CBC WITH DIFFERENTIAL/PLATELET    EKG None       Radiology Dg Chest 2 View  Result Date: 04/17/2018 CLINICAL DATA:  Shortness of breath since last Thursday, received a breathing treatment in the emergency department earlier today, history myasthenia gravis, GERD, irritable bowel syndrome, melanoma, sleep apnea EXAM: CHEST - 2 VIEW COMPARISON:  04/15/2018 FINDINGS: Normal heart size post median sternotomy. Mediastinal contours and pulmonary vascularity normal. Subsegmental atelectasis LEFT base. Lungs otherwise clear. No infiltrate, pleural effusion or pneumothorax. Bones unremarkable. IMPRESSION: Subsegmental atelectasis at lingula. Electronically Signed   By: Lavonia Dana M.D.   On: 04/17/2018 11:07   Dg Chest 2 View  Result Date: 04/15/2018 CLINICAL DATA:  Cough. EXAM: CHEST - 2 VIEW COMPARISON:  August 11, 2017 FINDINGS: The heart size and mediastinal contours are within normal limits. Both lungs are clear. The visualized skeletal structures are unremarkable. IMPRESSION: No active cardiopulmonary disease. Electronically Signed   By: Dorise Bullion III M.D   On: 04/15/2018 16:10    Procedures Procedures (including critical care time)  CRITICAL CARE Performed by: Kennith Maes   Total critical care time: 30 minutes  Critical care time was exclusive of separately billable procedures and treating other patients.  Critical care was necessary to treat or prevent imminent or life-threatening deterioration.  Critical care was time spent personally by me on the following activities: development of treatment plan with patient and/or  surrogate as well as nursing, discussions with consultants, evaluation of patient's response to treatment, examination of patient, obtaining history from patient or surrogate, ordering and performing treatments and interventions, ordering and review of laboratory studies, ordering and review of radiographic studies, pulse oximetry and re-evaluation of patient's condition.   Medications Ordered in ED Medications  magnesium sulfate IVPB 2 g 50 mL (2 g Intravenous New Bag/Given 04/17/18 1149)  0.9 %  sodium chloride infusion ( Intravenous New Bag/Given 04/17/18 1147)  ipratropium-albuterol (DUONEB) 0.5-2.5 (3) MG/3ML nebulizer solution 3 mL (3 mLs Nebulization Given 04/17/18 1017)  albuterol (PROVENTIL) (2.5 MG/3ML) 0.083% nebulizer solution 2.5 mg (2.5 mg Nebulization Given 04/17/18 1017)  methylPREDNISolone sodium succinate (SOLU-MEDROL) 125 mg/2 mL injection 125 mg (125 mg Intravenous Given 04/17/18 1048)  albuterol (PROVENTIL) (2.5 MG/3ML) 0.083% nebulizer solution 2.5 mg (2.5 mg Nebulization Given 04/17/18 1109)  ipratropium-albuterol (DUONEB) 0.5-2.5 (3) MG/3ML nebulizer solution 3 mL (3 mLs Nebulization Given 04/17/18 1109)  albuterol (PROVENTIL,VENTOLIN) solution continuous neb (15 mg/hr Nebulization Given 04/17/18 1155)     Initial Impression / Assessment and Plan / ED Course  I have reviewed the triage vital signs and the nursing notes.  Pertinent labs & imaging results that were available during my care of the patient were reviewed by me and considered in my medical decision making (see chart for details).  Patient is a 58 yo female w/ hx of myasthenia gravis & asthma who returns to the ER for worsened dyspnea/wheezing/cough. She is nontoxic appearing, noted to be tachypnic & tachycardic with diffuse wheezing most prominent in expiratory phase. Maintain SpO2 > 95% on RA. Will evaluate w/ basic labs, CXR, & EKG. RT at bedside administering neb tx, solumedrol ordered. Once patient's RR decreases  some will obtain NIF given hx of myasthenia gravis.   Work-up Reviewed:  CBC: No anemia or leukocytosis  BMP: Mild hypocalcemia @ 8.6.  EKG: Sinus rhythm, no STEMI CXR: Subsegmental atelectasis at lingula. No infiltrate, pleural effusion, or pneumothorax.  NIF: -45 cmH2O FVC: 3.35L   Patient w/ persistent diffuse wheezing. Will give mag & place on continuous neb tx. Suspect respiratory complications post influenza, feel underlying PE is less likely w/ her wheezing, EKG without STEMI. Feel patient warrants admission for further tx/monitoring based on presentation & risk w/ her myasthenia gravis.   12:15: CONSULT: Discussed case with hospitalist Dr. Florene Glen- we discussed option of abx administration, given patient has diffuse wheezing, she is afebrile, and there is no leukocytosis will plan to hold off for now which Dr. Florene Glen is in agreement. RVP ordered. Accepts admission.  Findings and plan of care discussed with supervising physician Dr. Rex Kras who personally evaluated and examined this patient & provided guidance in management & plan for admission.    Final Clinical Impressions(s) / ED Diagnoses   Final diagnoses:  Exacerbation of asthma, unspecified asthma severity, unspecified whether persistent    ED Discharge Orders    None       Amaryllis Dyke, PA-C 04/17/18 [place], Wenda Overland, MD 04/20/18 [phone]

## 2018-04-17 NOTE — ED Notes (Signed)
Resp Mech. NIF -45 cmH2O,  FVC 3.35L

## 2018-04-17 NOTE — ED Notes (Signed)
Patient transported to X-ray 

## 2018-04-17 NOTE — ED Notes (Signed)
ED Provider at bedside. 

## 2018-04-17 NOTE — Progress Notes (Signed)
Pt arrived to unit alert and oriented. Pt able to ambulate to bed, dyspnea on exertion. Vitals stable. MD paged to make aware pt arrived from [facility].  Will continue to monitor.

## 2018-04-17 NOTE — ED Triage Notes (Signed)
Pt was diagnosed with the Flu last Tuesday. She is still having SOB, PCP told her to come her to be evaluated.

## 2018-04-17 NOTE — Telephone Encounter (Addendum)
Pt h/o flu last Tuesday. Pt was seen in the ED 04/15/18 and was given IV Abx, Magnesium Sulfate IV, Duoneb, IV Rocephin and Zithromax. CXR was negative for pneumonia.  Pt finished a steroid taper and has been using her nebulizer and inhaler. Pt c/o SOB at rest and worse after coughing episodes. Pt has a harsh, deep cough and can hear chest congestion over the phone. Pt was dc 'd from the ED with a prescription for azithromycin and amoxicillin. Pt stated that her chest is sore from coughing and her cough is nonproductive. Pt advised to go back to the ED for reevaluation. Pt verbalized understanding. No international travel.  Reason for Disposition . Difficulty breathing  Answer Assessment - Initial Assessment Questions 1. ONSET: "When did the cough begin?"      Last Tuesday 2. SEVERITY: "How bad is the cough today?"      Very congested deep, crackles, coughing episodes that cause pt to get out of breath 3. RESPIRATORY DISTRESS: "Describe your breathing."      Crackles, wheezing despite inhalers and nebulizers 4. FEVER: "Do you have a fever?" If so, ask: "What is your temperature, how was it measured, and when did it start?"     no 5. HEMOPTYSIS: "Are you coughing up any blood?" If so ask: "How much?" (flecks, streaks, tablespoons, etc.)     no 6. TREATMENT: "What have you done so far to treat the cough?" (e.g., meds, fluids, humidifier)     Antibiotics since last week, Albuterol nebulizer's (3-4 times per day) and inhalers (4 times per day) 7. CARDIAC HISTORY: "Do you have any history of heart disease?" (e.g., heart attack, congestive heart failure)      no 8. LUNG HISTORY: "Do you have any history of lung disease?"  (e.g., pulmonary embolus, asthma, emphysema)     Asthma pneumonia twice 9. PE RISK FACTORS: "Do you have a history of blood clots?" (or: recent major surgery, recent prolonged travel, bedridden)     no 10. OTHER SYMPTOMS: "Do you have any other symptoms? (e.g., runny nose,  wheezing, chest pain)       Chest wall pain, gets SOB after coughing episodes, wheezing and crackles  11. PREGNANCY: "Is there any chance you are pregnant?" "When was your last menstrual period?"       n/a 12. TRAVEL: "Have you traveled out of the country in the last month?" (e.g., travel history, exposures)      no  Protocols used: COUGH - ACUTE NON-PRODUCTIVE-A-AH

## 2018-04-17 NOTE — Treatment Plan (Signed)
58 yo F with hx myasthenia, asthma, anemia, OSA, melanoma presenting with asthma exacerbation. Received steroids, mag, nebs in ED, but persistent wheezing.  Requesting admission for persistent SOB.  Admit to stepdown at [facility].  NIF -45 cmH20.

## 2018-04-17 NOTE — H&P (Signed)
History and Physical    [name] LNL:[id number] DOB: [birth year] DOA: 04/17/2018  PCP: Darreld Mclean, MD Patient coming from: Home  Chief Complaint: Cough shortness of breath for the last 1 week  HPI: [name] is a 58 y.o. female with medical history significant of myasthenia gravis and asthma was diagnosed with flu influenza A last Tuesday a week ago today treated for it she took Tamiflu for 7 days.  She continued to have symptoms of cough shortness of breath and wheezing she went back to the emergency room on the 16th she was prescribed amoxicillin still did not get better so she came to the ER again today.  Chest x-ray showed no evidence of pneumonia at that time.  She took her flu shot this year.  She reports her asthma exacerbation very few times a year.  She is not a smoker she does not have pets or travel history or sick contacts at home.  She recently changed her job and has been going through a lot of stress for the last 6 months.  She denies any nausea vomiting.  She had some diarrhea associated with antibiotics which has been resolved.  No urinary complaints.  She describes pleuritic chest pain and back pain increasing with cough.  After she got flu her husband was diagnosed with influenza A he is also treated.  She has a history of resting tachycardia for a long time.  She has no symptoms associated with it.  She has no cardiac history that she knows of.  No thyroid history that she knows of.  However she has history of myasthenia gravis.   ED Course: Patient received IV Solu-Medrol and nebulizers and was transferred to [facility] long.  Review of Systems: As per HPI otherwise all other systems reviewed and are negative  Ambulatory Status: She is ambulatory at baseline she works as a Research officer, political party of the nephrology group.  She lives with her husband at home.    Past Medical History:  Diagnosis Date  . Abnormal liver function   . Anemia   . Arthritis    back & knees   . Asthma    uses albuterol inhaler if outside, cold weather & exercising   . Depression   . Dyspnea   . Fatty liver   . GERD (gastroesophageal reflux disease)   . History of hiatal hernia   . History of kidney stones    passed spontaneously  . IBS (irritable bowel syndrome)   . Iron deficiency anemia   . Joint pain   . Lactose intolerance   . Leg edema   . Melanoma ([place])    malignant, left leg,  wide excision, sentinel node, one positive stage 3, as a result- lymphdema, will be following with ONCOLOGY, PET scan- last 2017  . Myasthenia gravis ([place])   . Myasthenia gravis ([place]) 2000  . Osteoarthritis   . Pneumonia 2015   hosp. [facility] , treated with IV antibiotics   . Recurrent upper respiratory infection (URI)   . Sleep apnea    sleep irregular pattern- no CPAP order solidified yet.   Marland Kitchen Spinal stenosis   . Vitamin B deficiency   . Vitamin D deficiency     Past Surgical History:  Procedure Laterality Date  . APPENDECTOMY    . BACK SURGERY    . BREAST SURGERY Bilateral 2004   reduction   . EYE SURGERY Bilateral 1999   corneal peel  . MELANOMA EXCISION Left  removed from left leg  . OOPHORECTOMY Left   . THYMECTOMY  2001  . TOTAL KNEE ARTHROPLASTY Left 01/2016  . WRIST SURGERY Left    plate & 6 screws     Social History   Socioeconomic History  . Marital status: Married    Spouse name: Junie Bame  . Number of children: 2  . Years of education: Not on file  . Highest education level: Not on file  Occupational History  . Occupation: clinical research  Social Needs  . Financial resource strain: Not on file  . Food insecurity:    Worry: Not on file    Inability: Not on file  . Transportation needs:    Medical: Not on file    Non-medical: Not on file  Tobacco Use  . Smoking status: Never Smoker  . Smokeless tobacco: Never Used  Substance and Sexual Activity  . Alcohol use: Yes    Alcohol/week: 1.0 standard drinks    Types: 1 Standard  drinks or equivalent per week    Comment: occassionally  . Drug use: No  . Sexual activity: Yes    Birth control/protection: None  Lifestyle  . Physical activity:    Days per week: Not on file    Minutes per session: Not on file  . Stress: Not on file  Relationships  . Social connections:    Talks on phone: Not on file    Gets together: Not on file    Attends religious service: Not on file    Active member of club or organization: Not on file    Attends meetings of clubs or organizations: Not on file    Relationship status: Not on file  . Intimate partner violence:    Fear of current or ex partner: Not on file    Emotionally abused: Not on file    Physically abused: Not on file    Forced sexual activity: Not on file  Other Topics Concern  . Not on file  Social History Narrative   ** Merged History Encounter **        Allergies  Allergen Reactions  . Other Other (See Comments)    Cant take some "mycin" drugs   . Tape Other (See Comments)    Blister, irritates skin    Family History  Problem Relation Age of Onset  . COPD Mother   . Asthma Mother   . Allergic rhinitis Mother   . Lung cancer Father   . Diverticulitis Father   . Sleep apnea Father   . Asthma Father   . Allergic rhinitis Father   . Colitis Neg Hx   . Urticaria Neg Hx   . Eczema Neg Hx     Prior to Admission medications   Medication Sig Start Date End Date Taking? Authorizing Provider  albuterol (PROVENTIL HFA;VENTOLIN HFA) 108 (90 Base) MCG/ACT inhaler Inhale 1 puff into the lungs every 6 (six) hours as needed for wheezing or shortness of breath. 05/17/17  Yes Elsie Stain, MD  amoxicillin (AMOXIL) 500 MG capsule Take 2 capsules (1,000 mg total) by mouth 2 (two) times daily. 04/15/18  Yes Ward, Ozella Almond, PA-C  ATROVENT HFA 17 MCG/ACT inhaler  04/10/18  Yes [provider]  azithromycin (ZITHROMAX) 250 MG tablet Take 1 tablet (250 mg total) by mouth daily. Starting 2/17. You received  your first dose in the ER tonight. 04/15/18  Yes Ward, Ozella Almond, PA-C  buPROPion (WELLBUTRIN SR) 200 MG 12 hr tablet Take 1  tablet (200 mg total) by mouth daily. 03/15/18  Yes Jearld Lesch A, DO  Carboxymethylcellulose Sodium (EYE DROPS OP) Apply 1 drop to eye daily as needed (dry eyes).   Yes [provider]  Cholecalciferol (VITAMIN D3) 1.25 MG (50000 UT) CAPS TAKE 1 CAPSULE BY MOUTH ONE TIME PER WEEK 02/15/18  Yes Jearld Lesch A, DO  Fluticasone Propionate (XHANCE) 93 MCG/ACT EXHU Place 2 puffs into the nose 2 (two) times daily. 01/24/18  Yes Garnet Sierras, DO  HYDROcodone-acetaminophen (NORCO/VICODIN) 5-325 MG tablet Take 1 tablet by mouth every 6 (six) hours as needed for moderate pain. 02/23/18 02/23/19 Yes Copland, Gay Filler, MD  ipratropium (ATROVENT) 0.03 % nasal spray Place 2 sprays into both nostrils 4 (four) times daily. 10/09/17  Yes Copland, Gay Filler, MD  ipratropium-albuterol (DUONEB) 0.5-2.5 (3) MG/3ML SOLN Take 3 mLs by nebulization every 4 (four) hours as needed (wheezing or shortness of breath).  04/10/18  Yes [provider]  metFORMIN (GLUCOPHAGE) 500 MG tablet Take 1 tablet (500 mg total) by mouth daily with breakfast. 03/15/18  Yes Jearld Lesch A, DO  montelukast (SINGULAIR) 10 MG tablet Take 1 tablet (10 mg total) by mouth at bedtime. 01/24/18  Yes Garnet Sierras, DO  pantoprazole (PROTONIX) 40 MG tablet Take 1 tablet by mouth daily. 03/26/18  Yes Esterwood, Amy S, PA-C  pramipexole (MIRAPEX) 1.5 MG tablet TAKE 2 TABLETS (3 MG TOTAL) BY MOUTH 2 (TWO) TIMES DAILY AS NEEDED (FOR RESTLESS LEG SYNDROME Patient taking differently: Take 3 mg by mouth 2 (two) times daily as needed (RLS).  03/13/18  Yes [place], Holli Humbles, NP  tiZANidine (ZANAFLEX) 4 MG capsule Take 4 mg by mouth at bedtime. 02/16/18  Yes [provider]  topiramate (TOPAMAX) 50 MG tablet Take 1 tablet (50 mg total) by mouth daily. 03/15/18  Yes Jearld Lesch A, DO  EPINEPHrine 0.3 mg/0.3 mL IJ SOAJ  injection  02/22/18   [provider]  triamcinolone cream (KENALOG) 0.1 % Apply 1 application topically 2 (two) times daily. Patient not taking: Reported on 04/17/2018 08/23/17   Marletta Lor, MD  Vitamin D, Ergocalciferol, (DRISDOL) 1.25 MG (50000 UT) CAPS capsule Take 1 capsule (50,000 Units total) by mouth every 7 (seven) days. Patient not taking: Reported on 04/17/2018 03/15/18   Georgia Lopes, DO    Physical Exam: Vitals:   04/17/18 1457 04/17/18 1500 04/17/18 1600 04/17/18 1647  BP:  (!) 110/55 124/64 137/71  Pulse:  (!) 121 (!) 120 (!) 123  Resp:  20 15 (!) 24  Temp:      TempSrc:      SpO2: 97% 97% 94% 97%  Weight:      Height:         . General:  Appears calm and comfortable . Eyes:  PERRL, EOMI, normal lids, iris . ENT:  grossly normal hearing, lips & tongue, mmm . Neck:  no LAD, masses or thyromegaly . Cardiovascular: RRR, no m/r/g. No LE edema.  Marland Kitchen Respiratory: Diffuse wheezing bilaterally, no w/r/r. Normal respiratory effort. . Abdomen: soft, ntnd, NABS . Skin:  no rash or induration seen on limited exam . Musculoskeletal:  grossly normal tone BUE/BLE, good ROM, no bony abnormality . Psychiatric:  grossly normal mood and affect, speech fluent and appropriate, AOx3 . Neurologic:  CN 2-12 grossly intact, moves all extremities in coordinated fashion, sensation intact  Labs on Admission: I have personally reviewed following labs and imaging studies  CBC: Recent Labs  Lab 04/15/18  1706 04/17/18 1047  WBC 4.1 7.5  NEUTROABS 2.2 4.5  HGB 12.7 13.0  HCT 39.9 40.4  MCV 92.8 92.4  PLT 218 220   Basic Metabolic Panel: Recent Labs  Lab 04/15/18 1706 04/17/18 1047  NA 136 140  K 3.6 3.9  CL 98 104  CO2 28 26  GLUCOSE 92 110*  BUN 18 14  CREATININE 0.72 0.65  CALCIUM 8.4* 8.6*   GFR: Estimated Creatinine Clearance: 99.2 mL/min (by C-G formula based on SCr of 0.65 mg/dL). Liver Function Tests: Recent Labs  Lab 04/15/18 1706  AST 23  ALT  21  ALKPHOS 102  BILITOT 0.5  PROT 7.2  ALBUMIN 3.8   No results for input(s): LIPASE, AMYLASE in the last 168 hours. No results for input(s): AMMONIA in the last 168 hours. Coagulation Profile: No results for input(s): INR, PROTIME in the last 168 hours. Cardiac Enzymes: No results for input(s): CKTOTAL, CKMB, CKMBINDEX, TROPONINI in the last 168 hours. BNP (last 3 results) Recent Labs    10/09/17 1604  PROBNP 23.0   HbA1C: No results for input(s): HGBA1C in the last 72 hours. CBG: No results for input(s): GLUCAP in the last 168 hours. Lipid Profile: No results for input(s): CHOL, HDL, LDLCALC, TRIG, CHOLHDL, LDLDIRECT in the last 72 hours. Thyroid Function Tests: No results for input(s): TSH, T4TOTAL, FREET4, T3FREE, THYROIDAB in the last 72 hours. Anemia Panel: No results for input(s): VITAMINB12, FOLATE, FERRITIN, TIBC, IRON, RETICCTPCT in the last 72 hours. Urine analysis:    Component Value Date/Time   COLORURINE YELLOW 02/22/2013 1418   APPEARANCEUR CLOUDY (A) 02/22/2013 1418   LABSPEC 1.013 02/22/2013 1418   PHURINE 5.5 02/22/2013 1418   GLUCOSEU NEGATIVE 02/22/2013 1418   HGBUR NEGATIVE 02/22/2013 1418   BILIRUBINUR NEGATIVE 02/22/2013 1418   KETONESUR NEGATIVE 02/22/2013 1418   PROTEINUR NEGATIVE 02/22/2013 1418   UROBILINOGEN 0.2 02/22/2013 1418   NITRITE NEGATIVE 02/22/2013 1418   LEUKOCYTESUR NEGATIVE 02/22/2013 1418    Creatinine Clearance: Estimated Creatinine Clearance: 99.2 mL/min (by C-G formula based on SCr of 0.65 mg/dL).  Sepsis Labs: @LABRCNTIP (procalcitonin:4,lacticidven:4) )No results found for this or any previous visit (from the past 240 hour(s)).   Radiological Exams on Admission: Dg Chest 2 View  Result Date: 04/17/2018 CLINICAL DATA:  Shortness of breath since last Thursday, received a breathing treatment in the emergency department earlier today, history myasthenia gravis, GERD, irritable bowel syndrome, melanoma, sleep apnea EXAM:  CHEST - 2 VIEW COMPARISON:  04/15/2018 FINDINGS: Normal heart size post median sternotomy. Mediastinal contours and pulmonary vascularity normal. Subsegmental atelectasis LEFT base. Lungs otherwise clear. No infiltrate, pleural effusion or pneumothorax. Bones unremarkable. IMPRESSION: Subsegmental atelectasis at lingula. Electronically Signed   By: Lavonia Dana M.D.   On: 04/17/2018 11:07    EKG: Independently reviewed.   Assessment/Plan Active Problems:   Asthma exacerbation   #1 asthma exacerbation-post influenza.  Patient was treated with Tamiflu for 7 days.  I will treat her with Solu-Medrol Xopenex continue Protonix as she was taking at home.  Chest x-ray shows no evidence of infiltrates.  I will hold off on starting her on any antibiotics at this time.  However if her clinical condition does not improve consider starting antibiotics.  Patient denied any fever at home her main complaint was cough shortness of breath.  Continue Singulair.  Continue Norco for pleuritic chest pain.  #2 myasthenia gravis continue home medications including topiramate and Zanaflex.  #3 history of resting tachycardia will obtain TSH and  echo.  Patient has history of chronic resting tachycardia.  No known coronary artery disease that she knows of.  EKG shows low voltage with sinus tach.  #4 vitamin D deficiency continue vitamin D3  #5 type 2 diabetes will treat her with SSI during hospital stay and hold metformin.    Estimated body mass index is 38.06 kg/m as calculated from the following:   Height as of this encounter: 5\' 7"  (1.702 m).   Weight as of this encounter: 110.2 kg.   DVT prophylaxis: Lovenox Code Status: Full code Family Communication: Discussed with husband Disposition Plan: Pending clinical improvement Consults called: None Admission status: Inpatient   Georgette Shell MD [facility]  If 7PM-7AM, please contact night-coverage [email or web address] Password [facility]  04/17/2018, 5:29  PM

## 2018-04-18 ENCOUNTER — Inpatient Hospital Stay (HOSPITAL_COMMUNITY): Payer: BLUE CROSS/BLUE SHIELD

## 2018-04-18 DIAGNOSIS — G7000 Myasthenia gravis without (acute) exacerbation: Secondary | ICD-10-CM

## 2018-04-18 DIAGNOSIS — G2581 Restless legs syndrome: Secondary | ICD-10-CM

## 2018-04-18 DIAGNOSIS — J9621 Acute and chronic respiratory failure with hypoxia: Secondary | ICD-10-CM

## 2018-04-18 DIAGNOSIS — R9431 Abnormal electrocardiogram [ECG] [EKG]: Secondary | ICD-10-CM

## 2018-04-18 LAB — CBC
HCT: 38.9 % (ref 36.0–46.0)
Hemoglobin: 12.1 g/dL (ref 12.0–15.0)
MCH: 30 pg (ref 26.0–34.0)
MCHC: 31.1 g/dL (ref 30.0–36.0)
MCV: 96.5 fL (ref 80.0–100.0)
Platelets: 220 10*3/uL (ref 150–400)
RBC: 4.03 MIL/uL (ref 3.87–5.11)
RDW: 13.3 % (ref 11.5–15.5)
WBC: 8.6 10*3/uL (ref 4.0–10.5)
nRBC: 0 % (ref 0.0–0.2)

## 2018-04-18 LAB — COMPREHENSIVE METABOLIC PANEL
ALT: 25 U/L (ref 0–44)
AST: 26 U/L (ref 15–41)
Albumin: 3.7 g/dL (ref 3.5–5.0)
Alkaline Phosphatase: 96 U/L (ref 38–126)
Anion gap: 10 (ref 5–15)
BUN: 15 mg/dL (ref 6–20)
CHLORIDE: 105 mmol/L (ref 98–111)
CO2: 22 mmol/L (ref 22–32)
Calcium: 8.9 mg/dL (ref 8.9–10.3)
Creatinine, Ser: 0.65 mg/dL (ref 0.44–1.00)
GFR calc non Af Amer: >60 mL/min (ref >60)
Glucose, Bld: 180 mg/dL — ABNORMAL HIGH (ref 70–99)
Potassium: 4.6 mmol/L (ref 3.5–5.1)
SODIUM: 137 mmol/L (ref 135–145)
Total Bilirubin: 0.7 mg/dL (ref 0.3–1.2)
Total Protein: 7.1 g/dL (ref 6.5–8.1)

## 2018-04-18 LAB — ECHOCARDIOGRAM COMPLETE
Height: 67 in
Weight: 3862.46 oz

## 2018-04-18 LAB — GLUCOSE, CAPILLARY
GLUCOSE-CAPILLARY: 184 mg/dL — AB (ref 70–99)
Glucose-Capillary: 215 mg/dL — ABNORMAL HIGH (ref 70–99)
Glucose-Capillary: 126 mg/dL — ABNORMAL HIGH (ref 70–99)

## 2018-04-18 LAB — HEMOGLOBIN A1C
Hgb A1c MFr Bld: 5.8 % — ABNORMAL HIGH (ref 4.8–5.6)
Mean Plasma Glucose: 120 mg/dL

## 2018-04-18 LAB — TSH: TSH: 0.359 u[IU]/mL (ref 0.350–4.500)

## 2018-04-18 MED ORDER — METHYLPREDNISOLONE SODIUM SUCC 125 MG IJ SOLR
60.0000 mg | Freq: Two times a day (BID) | INTRAMUSCULAR | Status: DC
  Administered 2018-04-18 – 2018-04-19 (×2): 60 mg via INTRAVENOUS
  Filled 2018-04-18 (×2): qty 2

## 2018-04-18 MED ORDER — IPRATROPIUM-ALBUTEROL 0.5-2.5 (3) MG/3ML IN SOLN
3.0000 mL | Freq: Three times a day (TID) | RESPIRATORY_TRACT | Status: DC
  Administered 2018-04-19: 3 mL via RESPIRATORY_TRACT

## 2018-04-18 NOTE — Evaluation (Signed)
Physical Therapy Evaluation Patient Details Name: [name] MRN: [id number] DOB: 01-Oct-1960 Today's Date: 04/18/2018   History of Present Illness  Pt admitted 2* SOB and dx with asthma exacerbation.  Pt with hx of L TKR, back surgery, and Myasthenia Gravis.    Clinical Impression  Pt admitted with SOB 2* asthma exacerbation and presenting this date with ability to ambulate and perform basic mobility tasks unassisted.  Pt noted to maintain SaO2 at 95% or higher throughout session but with HR elevating as high as 133 - pt states this is her recent norm 2* stress - RN aware.  Pt with no skilled PT needs at this time and will be dc from service.    Follow Up Recommendations No PT follow up    Equipment Recommendations  None recommended by PT    Recommendations for Other Services       Precautions / Restrictions Restrictions Weight Bearing Restrictions: No      Mobility  Bed Mobility Overal bed mobility: Modified Independent             General bed mobility comments: Pt unassisted to/from bed  Transfers Overall transfer level: Modified independent               General transfer comment: Pt unassisted sit<>stand  Ambulation/Gait Ambulation/Gait assistance: Min guard;Supervision Gait Distance (Feet): 1200 Feet Assistive device: None Gait Pattern/deviations: WFL(Within Functional Limits) Gait velocity: cues to decrease pace 2* elevating HR - pt states this is her recent norm   General Gait Details: mild general instability but no LOB  Stairs            Wheelchair Mobility    Modified Rankin (Stroke Patients Only)       Balance Overall balance assessment: Mild deficits observed, not formally tested                                           Pertinent Vitals/Pain Pain Assessment: No/denies pain    Home Living Family/patient expects to be discharged to:: Private residence Living Arrangements: Spouse/significant  other Available Help at Discharge: Family Type of Home: House Home Access: Stairs to enter Entrance Stairs-Rails: None Technical brewer of Steps: 2 Home Layout: One level        Prior Function Level of Independence: Independent               Hand Dominance        Extremity/Trunk Assessment   Upper Extremity Assessment Upper Extremity Assessment: Overall WFL for tasks assessed    Lower Extremity Assessment Lower Extremity Assessment: Overall WFL for tasks assessed       Communication   Communication: No difficulties  Cognition Arousal/Alertness: Awake/alert Behavior During Therapy: WFL for tasks assessed/performed Overall Cognitive Status: Within Functional Limits for tasks assessed                                        General Comments      Exercises     Assessment/Plan    PT Assessment Patent does not need any further PT services  PT Problem List         PT Treatment Interventions      PT Goals (Current goals can be found in the Care Plan section)  Acute Rehab PT Goals Patient Stated  Goal: Home PT Goal Formulation: All assessment and education complete, DC therapy    Frequency     Barriers to discharge        Co-evaluation               AM-PAC PT "6 Clicks" Mobility  Outcome Measure Help needed turning from your back to your side while in a flat bed without using bedrails?: None Help needed moving from lying on your back to sitting on the side of a flat bed without using bedrails?: None Help needed moving to and from a bed to a chair (including a wheelchair)?: None Help needed standing up from a chair using your arms (e.g., wheelchair or bedside chair)?: None Help needed to walk in hospital room?: None Help needed climbing 3-5 steps with a railing? : None 6 Click Score: 24    End of Session   Activity Tolerance: Patient tolerated treatment well Patient left: in bed;with call bell/phone within reach Nurse  Communication: Mobility status PT Visit Diagnosis: Difficulty in walking, not elsewhere classified (R26.2)    Time: 1572-6203 PT Time Calculation (min) (ACUTE ONLY): 22 min   Charges:   PT Evaluation $PT Eval Low Complexity: 1 Low          [place] Pager [phone] Office [phone]   Pressley Tadesse 04/18/2018, 2:10 PM

## 2018-04-18 NOTE — Progress Notes (Signed)
Pt admitted to floor, no c/o SOB. No distress noted. VS WNL.

## 2018-04-18 NOTE — Progress Notes (Signed)
  Echocardiogram 2D Echocardiogram has been performed.  Jannett Celestine 04/18/2018, 11:52 AM

## 2018-04-18 NOTE — Progress Notes (Addendum)
PROGRESS NOTE    [name]  NLG:[id number] DOB: Feb 27, 1961 DOA: 04/17/2018 PCP: Darreld Mclean, MD    Chief complaint: Shortness of breath, cough  Brief Narrative: [name] is a 58 y.o. female with medical history significant of myasthenia gravis and asthma was diagnosed with flu influenza A last Tuesday a week ago today treated for it she took Tamiflu for 7 days.  She continued to have symptoms of cough shortness of breath and wheezing she went back to the emergency room on the 16th she was prescribed amoxicillin still did not get better so she came to the ER again today.  Chest x-ray showed no evidence of pneumonia at that time.  She took her flu shot this year.  She reports her asthma exacerbation very few times a year.  She is not a smoker she does not have pets or travel history or sick contacts at home.  She recently changed her job and has been going through a lot of stress for the last 6 months.  She denies any nausea vomiting.  She had some diarrhea associated with antibiotics which has been resolved.  No urinary complaints.  She describes pleuritic chest pain and back pain increasing with cough.  After she got flu her husband was diagnosed with influenza A he is also treated.  She has a history of resting tachycardia for a long time. She has no symptoms associated with it.  She has no cardiac history that she knows of.  No thyroid history that she knows of.  However she has history of myasthenia gravis.  ED Course: Patient received IV Solu-Medrol and nebulizers and was transferred to [facility] long.    Assessment & Plan:   Active Problems:   Restless leg   Myasthenia gravis ([place])   Asthma exacerbation   Asthma attack   Acute hypoxic respiratory failure Acute asthma exacerbation Recent influenza infection Patient presented with progressive shortness of breath in setting of recent influenza infection in which he completed a 7-day course of Tamiflu.  Patient  was noted to be hypoxic on admission requiring supplemental oxygen and notable wheezing likely secondary to acute asthma exacerbation.  Chest x-ray unrevealing for acute cardiopulmonary process.  Respiratory viral panel negative. --Now titrated off of supplemental oxygen --Will further titrate down Solu-Medrol from 60 mg IV q6h to 60mg  IV q12h --Continue nebs and Singulair --Supportive care --Transfer from stepdown to med/surg Floor today  Myasthenia gravis --continue home medications including topiramate and Zanaflex.  Hx of resting tachycardia Patient has history of chronic resting tachycardia.  No known coronary artery disease that she knows of. EKG shows low voltage with sinus tach.  TSH within normal limits. --Heart rate improved, 77 this morning, discontinue telemetry --Echocardiogram pending  vitamin D deficiency  --continue vitamin D3  Type 2 diabetes  Hemoglobin A1c 5.8, well-controlled, will hold home metformin while inpatient --Continue coverage with sliding scale insulin as needed  Obesity BMI 37.8, discussed with patient need for weight loss measures moving forward as this complicates all facets of care.  Weakness/debility: --PT evaluation for discharge needs   DVT prophylaxis: Lovenox Code Status: Full code Family Communication: None Disposition Plan: Anticipate discharge home in 1-2 days.  Consultants:   none  Procedures:   Transthoracic echocardiogram: Pending  Antimicrobials:   none   Subjective: Patient seen and examined at bedside, resting comfortably in bed.  Reports mild headache and left-sided rib pain with cough.  Reports breathing much improved.  No other complaints at this time.  Denies visual changes, no chest pain, no palpitations, no abdominal pain, no fever/chills/night sweats, no congestion, no issues with bowel/bladder function, no paresthesias.  No acute events overnight per nursing staff.  Objective: Vitals:   04/18/18 0000  04/18/18 0400 04/18/18 0448 04/18/18 0800  BP:  125/81  (!) 143/75  Pulse: (!) 107 77  95  Resp: 20   17  Temp:   97.6 F (36.4 C)   TempSrc:   Axillary   SpO2: 95% 95%  96%  Weight:      Height:        Intake/Output Summary (Last 24 hours) at 04/18/2018 1109 Last data filed at 04/17/2018 1459 Gross per 24 hour  Intake 76.33 ml  Output -  Net 76.33 ml   Filed Weights   04/17/18 1006 04/17/18 1647  Weight: 110.2 kg 109.5 kg    Examination:  General exam: Appears calm and comfortable  Respiratory system: Mild late expiratory wheezing bilaterally, no crackles, respiratory effort normal. Cardiovascular system: S1 & S2 heard, RRR. No JVD, murmurs, rubs, gallops or clicks. No pedal edema. Gastrointestinal system: Abdomen is nondistended, soft and nontender. No organomegaly or masses felt. Normal bowel sounds heard. Central nervous system: Alert and oriented. No focal neurological deficits. Extremities: Symmetric 5 x 5 power. Skin: No rashes, lesions or ulcers Psychiatry: Judgement and insight appear normal. Mood & affect appropriate.     Data Reviewed: I have personally reviewed following labs and imaging studies  CBC: Recent Labs  Lab 04/15/18 1706 04/17/18 1047 04/18/18 0254  WBC 4.1 7.5 8.6  NEUTROABS 2.2 4.5  --   HGB 12.7 13.0 12.1  HCT 39.9 40.4 38.9  MCV 92.8 92.4 96.5  PLT 218 228 182   Basic Metabolic Panel: Recent Labs  Lab 04/15/18 1706 04/17/18 1047 04/18/18 0254  NA 136 140 137  K 3.6 3.9 4.6  CL 98 104 105  CO2 28 26 22   GLUCOSE 92 110* 180*  BUN 18 14 15   CREATININE 0.72 0.65 0.65  CALCIUM 8.4* 8.6* 8.9   GFR: Estimated Creatinine Clearance: 99 mL/min (by C-G formula based on SCr of 0.65 mg/dL). Liver Function Tests: Recent Labs  Lab 04/15/18 1706 04/18/18 0254  AST 23 26  ALT 21 25  ALKPHOS 102 96  BILITOT 0.5 0.7  PROT 7.2 7.1  ALBUMIN 3.8 3.7   No results for input(s): LIPASE, AMYLASE in the last 168 hours. No results for  input(s): AMMONIA in the last 168 hours. Coagulation Profile: No results for input(s): INR, PROTIME in the last 168 hours. Cardiac Enzymes: No results for input(s): CKTOTAL, CKMB, CKMBINDEX, TROPONINI in the last 168 hours. BNP (last 3 results) Recent Labs    10/09/17 1604  PROBNP 23.0   HbA1C: Recent Labs    04/17/18 1805  HGBA1C 5.8*   CBG: Recent Labs  Lab 04/17/18 2214 04/18/18 0750  GLUCAP 308* 215*   Lipid Profile: No results for input(s): CHOL, HDL, LDLCALC, TRIG, CHOLHDL, LDLDIRECT in the last 72 hours. Thyroid Function Tests: Recent Labs    04/18/18 0254  TSH 0.359   Anemia Panel: No results for input(s): VITAMINB12, FOLATE, FERRITIN, TIBC, IRON, RETICCTPCT in the last 72 hours. Sepsis Labs: No results for input(s): PROCALCITON, LATICACIDVEN in the last 168 hours.  Recent Results (from the past 240 hour(s))  Respiratory Panel by PCR     Status: None   Collection Time: 04/17/18 12:48 PM  Result Value Ref Range Status   Adenovirus NOT DETECTED NOT DETECTED Final  Coronavirus 229E NOT DETECTED NOT DETECTED Final    Comment: (NOTE) The Coronavirus on the Respiratory Panel, DOES NOT test for the novel  Coronavirus (2019 nCoV)    Coronavirus HKU1 NOT DETECTED NOT DETECTED Final   Coronavirus NL63 NOT DETECTED NOT DETECTED Final   Coronavirus OC43 NOT DETECTED NOT DETECTED Final   Metapneumovirus NOT DETECTED NOT DETECTED Final   Rhinovirus / Enterovirus NOT DETECTED NOT DETECTED Final   Influenza A NOT DETECTED NOT DETECTED Final   Influenza B NOT DETECTED NOT DETECTED Final   Parainfluenza Virus 1 NOT DETECTED NOT DETECTED Final   Parainfluenza Virus 2 NOT DETECTED NOT DETECTED Final   Parainfluenza Virus 3 NOT DETECTED NOT DETECTED Final   Parainfluenza Virus 4 NOT DETECTED NOT DETECTED Final   Respiratory Syncytial Virus NOT DETECTED NOT DETECTED Final   Bordetella pertussis NOT DETECTED NOT DETECTED Final   Chlamydophila pneumoniae NOT DETECTED NOT  DETECTED Final   Mycoplasma pneumoniae NOT DETECTED NOT DETECTED Final    Comment: Performed at [facility], [place] [address], [place], [place] [address]  MRSA PCR Screening     Status: None   Collection Time: 04/17/18  5:10 PM  Result Value Ref Range Status   MRSA by PCR NEGATIVE NEGATIVE Final    Comment:        The GeneXpert MRSA Assay (FDA approved for NASAL specimens only), is one component of a comprehensive MRSA colonization surveillance program. It is not intended to diagnose MRSA infection nor to guide or monitor treatment for MRSA infections. Performed at [facility], [place] [address], [place], [place] [address]          Radiology Studies: Dg Chest 2 View  Result Date: 04/17/2018 CLINICAL DATA:  Shortness of breath since last Thursday, received a breathing treatment in the emergency department earlier today, history myasthenia gravis, GERD, irritable bowel syndrome, melanoma, sleep apnea EXAM: CHEST - 2 VIEW COMPARISON:  04/15/2018 FINDINGS: Normal heart size post median sternotomy. Mediastinal contours and pulmonary vascularity normal. Subsegmental atelectasis LEFT base. Lungs otherwise clear. No infiltrate, pleural effusion or pneumothorax. Bones unremarkable. IMPRESSION: Subsegmental atelectasis at lingula. Electronically Signed   By: Lavonia Dana M.D.   On: 04/17/2018 11:07        Scheduled Meds: . buPROPion  200 mg Oral Daily  . enoxaparin (LOVENOX) injection  40 mg Subcutaneous Q24H  . insulin aspart  0-15 Units Subcutaneous TID WC  . ipratropium-albuterol  3 mL Nebulization QID  . methylPREDNISolone (SOLU-MEDROL) injection  60 mg Intravenous Q12H  . montelukast  10 mg Oral QHS  . pantoprazole  40 mg Oral Daily  . tiZANidine  4 mg Oral QHS  . topiramate  50 mg Oral Daily   Continuous Infusions: . sodium chloride Stopped (04/17/18 1426)     LOS: 1 day    Time spent: 24 mintutes    Emberleigh Reily J [place] MD [facility] Pager [phone]  If 7PM-7AM, please contact night-coverage [email or web address] Password [facility] 04/18/2018, 11:09 AM

## 2018-04-19 ENCOUNTER — Other Ambulatory Visit: Payer: Self-pay | Admitting: Physician Assistant

## 2018-04-19 LAB — GLUCOSE, CAPILLARY
GLUCOSE-CAPILLARY: 171 mg/dL — AB (ref 70–99)
Glucose-Capillary: 127 mg/dL — ABNORMAL HIGH (ref 70–99)

## 2018-04-19 MED ORDER — BENZOCAINE 10 % MT GEL
Freq: Four times a day (QID) | OROMUCOSAL | Status: DC | PRN
  Filled 2018-04-19: qty 9.4

## 2018-04-19 MED ORDER — HYDROCODONE-HOMATROPINE 5-1.5 MG/5ML PO SYRP: 5.0000 mL | ORAL_SOLUTION | Freq: Four times a day (QID) | ORAL | 0 refills | Status: DC | PRN

## 2018-04-19 MED ORDER — PREDNISONE 5 MG PO TABS: ORAL_TABLET | ORAL | 0 refills | Status: AC

## 2018-04-19 MED ORDER — PANTOPRAZOLE SODIUM 40 MG PO TBEC: 40.0000 mg | DELAYED_RELEASE_TABLET | Freq: Every day | ORAL | 0 refills | Status: DC

## 2018-04-19 MED ORDER — ALUM & MAG HYDROXIDE-SIMETH 200-200-20 MG/5ML PO SUSP
30.0000 mL | Freq: Four times a day (QID) | ORAL | Status: DC | PRN
  Filled 2018-04-19: qty 30

## 2018-04-19 NOTE — Discharge Summary (Signed)
Physician Discharge Summary  [name] CRF:[id number] DOB: 03/21/1960 DOA: 04/17/2018  PCP: Darreld Mclean, MD  Admit date: 04/17/2018 Discharge date: 04/19/2018  Admitted From: Home Disposition:  Home  Recommendations for Outpatient Follow-up:  1. Follow up with PCP in 1-2 weeks   Home Health: No Equipment/Devices: None  Discharge Condition: Stable CODE STATUS: Full code Diet recommendation: Consistent carbohydrate diet  Chief complaint: Shortness of breath, cough  History of present illness:  Briana Hendersonis a 58 y.o.femalewith medical history significant ofmyasthenia gravis and asthma was diagnosed with flu influenza A last Tuesday a week ago today treated for it she took Tamiflu for 7 days. She continued to have symptoms of cough shortness of breath and wheezing she went back to the emergency room on the 16th she was prescribed amoxicillin still did not get better so she came to the ER again today.   Chest x-ray showed no evidence of pneumonia at that time. She took her flu shot this year. She reports her asthma exacerbation very few times a year. She is not a smoker she does not have pets or travel history or sick contacts at home. She recently changed her job and has been going through a lot of stress for the last 6 months. She denies any nausea vomiting. She had some diarrhea associated with antibiotics which has been resolved. No urinary complaints. She describes pleuritic chest pain and back pain increasing with cough. After she got flu her husband was diagnosed with influenza A he is also treated. She has a history of resting tachycardia for a long time. She has no symptoms associated with it. She has no cardiac history that she knows of. No thyroid history that she knows of. However she has history of myasthenia gravis.  ED Course:Patient received IV Solu-Medrol and nebulizers and was transferred to [facility] long.  Hospital course:  Acute  hypoxic respiratory failure Acute asthma exacerbation Recent influenza infection Patient presented with progressive shortness of breath in setting of recent influenza infection in which he completed a 7-day course of Tamiflu.  Patient was noted to be hypoxic on admission requiring supplemental oxygen and notable wheezing likely secondary to acute asthma exacerbation.  Chest x-ray unrevealing for acute cardiopulmonary process.  Respiratory viral panel negative.  Patient was started on IV steroids with continuous neb treatments.  She was continued on her home Singulair.  Patient did well with resolution of her hypoxia and was able to be titrated back to room air.  Patient will continue a prednisone taper following discharge.  Myasthenia gravis continue home medications including topiramate and Zanaflex.  Continue outpatient follow-up is scheduled.  Hx of resting tachycardia Patient has history of chronic resting tachycardia. No known coronary artery disease that she knows of. EKG shows low voltage with sinus tach.  TSH within normal limits.  TTE notable for EF 60-65% with LA moderately dilated.  Patient's heart rates remained normal during the remainder of her hospitalization following treatment of her acute illness as above.  vitamin D deficiency  -continue vitamin D3  Type 2 diabetes  Hemoglobin A1c 5.8, well-controlled, resume home metformin following hospitalization.  Obesity BMI 37.8, discussed with patient need for weight loss measures moving forward as this complicates all facets of care.  Discharge Diagnoses:  Active Problems:   Restless leg   Myasthenia gravis [facility])    Discharge Instructions  Discharge Instructions    Call MD for:  difficulty breathing, headache or visual disturbances   Complete by:  As directed  Call MD for:  persistant nausea and vomiting   Complete by:  As directed    Call MD for:  severe uncontrolled pain   Complete by:  As directed    Call MD  for:  temperature >100.4   Complete by:  As directed    Diet - low sodium heart healthy   Complete by:  As directed    Increase activity slowly   Complete by:  As directed      Allergies as of 04/19/2018      Reactions   Other Other (See Comments)   Cant take some "mycin" drugs    Tape Other (See Comments)   Blister, irritates skin      Medication List    STOP taking these medications   amoxicillin 500 MG capsule Commonly known as:  AMOXIL   azithromycin 250 MG tablet Commonly known as:  ZITHROMAX   triamcinolone cream 0.1 % Commonly known as:  KENALOG     TAKE these medications   albuterol 108 (90 Base) MCG/ACT inhaler Commonly known as:  PROVENTIL HFA;VENTOLIN HFA Inhale 1 puff into the lungs every 6 (six) hours as needed for wheezing or shortness of breath.   ATROVENT HFA 17 MCG/ACT inhaler Generic drug:  ipratropium   buPROPion 200 MG 12 hr tablet Commonly known as:  WELLBUTRIN SR Take 1 tablet (200 mg total) by mouth daily.   EPINEPHrine 0.3 mg/0.3 mL Soaj injection Commonly known as:  EPI-PEN   EYE DROPS OP Apply 1 drop to eye daily as needed (dry eyes).   Fluticasone Propionate 93 MCG/ACT Exhu Commonly known as:  XHANCE Place 2 puffs into the nose 2 (two) times daily.   HYDROcodone-acetaminophen 5-325 MG tablet Commonly known as:  NORCO/VICODIN Take 1 tablet by mouth every 6 (six) hours as needed for moderate pain.   HYDROcodone-homatropine 5-1.5 MG/5ML syrup Commonly known as:  HYCODAN Take 5 mLs by mouth every 6 (six) hours as needed for cough.   ipratropium 0.03 % nasal spray Commonly known as:  ATROVENT Place 2 sprays into both nostrils 4 (four) times daily.   ipratropium-albuterol 0.5-2.5 (3) MG/3ML Soln Commonly known as:  DUONEB Take 3 mLs by nebulization every 4 (four) hours as needed (wheezing or shortness of breath).   metFORMIN 500 MG tablet Commonly known as:  GLUCOPHAGE Take 1 tablet (500 mg total) by mouth daily with  breakfast.   montelukast 10 MG tablet Commonly known as:  SINGULAIR Take 1 tablet (10 mg total) by mouth at bedtime.   pantoprazole 40 MG tablet Commonly known as:  PROTONIX Take 1 tablet (40 mg total) by mouth daily for 30 days. What changed:    how much to take  how to take this  when to take this  additional instructions   pramipexole 1.5 MG tablet Commonly known as:  MIRAPEX TAKE 2 TABLETS (3 MG TOTAL) BY MOUTH 2 (TWO) TIMES DAILY AS NEEDED (FOR RESTLESS LEG SYNDROME What changed:  See the new instructions.   predniSONE 5 MG tablet Commonly known as:  DELTASONE Take 8 tablets (40 mg total) by mouth daily with breakfast for 2 days, THEN 4 tablets (20 mg total) daily with breakfast for 2 days, THEN 2 tablets (10 mg total) daily with breakfast for 2 days, THEN 1 tablet (5 mg total) daily with breakfast for 2 days. Start taking on:  April 19, 2018   tiZANidine 4 MG capsule Commonly known as:  ZANAFLEX Take 4 mg by mouth at bedtime.  topiramate 50 MG tablet Commonly known as:  TOPAMAX Take 1 tablet (50 mg total) by mouth daily.   Vitamin D (Ergocalciferol) 1.25 MG (50000 UT) Caps capsule Commonly known as:  DRISDOL Take 1 capsule (50,000 Units total) by mouth every 7 (seven) days.   Vitamin D3 1.25 MG (50000 UT) Caps TAKE 1 CAPSULE BY MOUTH ONE TIME PER WEEK      Follow-up Information    Copland, Gay Filler, MD Follow up in 1 week(s).   Specialty:  Family Medicine Contact information: [address] [place] [address] [phone]          Allergies  Allergen Reactions  . Other Other (See Comments)    Cant take some "mycin" drugs   . Tape Other (See Comments)    Blister, irritates skin    Consultations: None    Procedures/Studies: Dg Chest 2 View  Result Date: 04/17/2018 CLINICAL DATA:  Shortness of breath since last Thursday, received a breathing treatment in the emergency department earlier today, history myasthenia  gravis, GERD, irritable bowel syndrome, melanoma, sleep apnea EXAM: CHEST - 2 VIEW COMPARISON:  04/15/2018 FINDINGS: Normal heart size post median sternotomy. Mediastinal contours and pulmonary vascularity normal. Subsegmental atelectasis LEFT base. Lungs otherwise clear. No infiltrate, pleural effusion or pneumothorax. Bones unremarkable. IMPRESSION: Subsegmental atelectasis at lingula. Electronically Signed   By: Lavonia Dana M.D.   On: 04/17/2018 11:07   Dg Chest 2 View  Result Date: 04/15/2018 CLINICAL DATA:  Cough. EXAM: CHEST - 2 VIEW COMPARISON:  August 11, 2017 FINDINGS: The heart size and mediastinal contours are within normal limits. Both lungs are clear. The visualized skeletal structures are unremarkable. IMPRESSION: No active cardiopulmonary disease. Electronically Signed   By: Dorise Bullion III M.D   On: 04/15/2018 16:10     Transthoracic echocardiogram 04/18/2018: IMPRESSION:  1. The left ventricle has normal systolic function with an ejection fraction of 60-65%. The cavity size was normal. There is mildly increased left ventricular wall thickness. Left ventricular diastolic parameters were normal.  2. The right ventricle has normal systolic function. The cavity was normal. There is no increase in right ventricular wall thickness.  3. Left atrial size was moderately dilated.  4. The mitral valve is degenerative. Mild thickening of the mitral valve leaflet.  5. The tricuspid valve is normal in structure.  6. The aortic valve is tricuspid Mild thickening of the aortic valve Mild calcification of the aortic valve.  7. The pulmonic valve was normal in structure.  8. The inferior vena cava was normal in size with <50% respiratory variability.   Subjective: Patient resting comfortably in bed, and titrated off supplemental oxygen.  Feels that her breathing is back to baseline.  No other complaints this morning.  Request discharge home.  Specific denies headache, no visual changes, no  nausea/vomiting/diarrhea, no fever/chills/night sweats, no shortness of breath, no chest pain, no palpitations, no abdominal pain, no weakness, no paresthesias.  No acute events reported overnight per nursing staff.   Discharge Exam: Vitals:   04/18/18 2230 04/19/18 0558  BP: 107/60 116/64  Pulse: 87 82  Resp: 16 16  Temp: 97.9 F (36.6 C) 97.6 F (36.4 C)  SpO2: 95% 100%   Vitals:   04/18/18 1416 04/18/18 2157 04/18/18 2230 04/19/18 0558  BP: 139/87  107/60 116/64  Pulse: (!) 102  87 82  Resp: 18  16 16   Temp: 98.1 F (36.7 C)  97.9 F (36.6 C) 97.6 F (36.4 C)  TempSrc: Oral  Oral Oral  SpO2: 95% 94% 95% 100%  Weight:      Height:        General: Pt is alert, awake, not in acute distress Cardiovascular: RRR, S1/S2 +, no rubs, no gallops Respiratory: CTA bilaterally, no wheezing, no rhonchi Abdominal: Soft, NT, ND, bowel sounds + Extremities: no edema, no cyanosis    The results of significant diagnostics from this hospitalization (including imaging, microbiology, ancillary and laboratory) are listed below for reference.     Microbiology: Recent Results (from the past 240 hour(s))  Respiratory Panel by PCR     Status: None   Collection Time: 04/17/18 12:48 PM  Result Value Ref Range Status   Adenovirus NOT DETECTED NOT DETECTED Final   Coronavirus 229E NOT DETECTED NOT DETECTED Final    Comment: (NOTE) The Coronavirus on the Respiratory Panel, DOES NOT test for the novel  Coronavirus (2019 nCoV)    Coronavirus HKU1 NOT DETECTED NOT DETECTED Final   Coronavirus NL63 NOT DETECTED NOT DETECTED Final   Coronavirus OC43 NOT DETECTED NOT DETECTED Final   Metapneumovirus NOT DETECTED NOT DETECTED Final   Rhinovirus / Enterovirus NOT DETECTED NOT DETECTED Final   Influenza A NOT DETECTED NOT DETECTED Final   Influenza B NOT DETECTED NOT DETECTED Final   Parainfluenza Virus 1 NOT DETECTED NOT DETECTED Final   Parainfluenza Virus 2 NOT DETECTED NOT DETECTED Final    Parainfluenza Virus 3 NOT DETECTED NOT DETECTED Final   Parainfluenza Virus 4 NOT DETECTED NOT DETECTED Final   Respiratory Syncytial Virus NOT DETECTED NOT DETECTED Final   Bordetella pertussis NOT DETECTED NOT DETECTED Final   Chlamydophila pneumoniae NOT DETECTED NOT DETECTED Final   Mycoplasma pneumoniae NOT DETECTED NOT DETECTED Final    Comment: Performed at [facility], [address] [place], [place] [address]  MRSA PCR Screening     Status: None   Collection Time: 04/17/18  5:10 PM  Result Value Ref Range Status   MRSA by PCR NEGATIVE NEGATIVE Final    Comment:        The GeneXpert MRSA Assay (FDA approved for NASAL specimens only), is one component of a comprehensive MRSA colonization surveillance program. It is not intended to diagnose MRSA infection nor to guide or monitor treatment for MRSA infections. Performed at [facility], [place] [address] [place], [place] [address]      Labs: BNP (last 3 results) Recent Labs    04/26/17 0637  BNP 49.7   Basic Metabolic Panel: Recent Labs  Lab 04/15/18 1706 04/17/18 1047 04/18/18 0254  NA 136 140 137  K 3.6 3.9 4.6  CL 98 104 105  CO2 28 26 22   GLUCOSE 92 110* 180*  BUN 18 14 15   CREATININE 0.72 0.65 0.65  CALCIUM 8.4* 8.6* 8.9   Liver Function Tests: Recent Labs  Lab 04/15/18 1706 04/18/18 0254  AST 23 26  ALT 21 25  ALKPHOS 102 96  BILITOT 0.5 0.7  PROT 7.2 7.1  ALBUMIN 3.8 3.7   No results for input(s): LIPASE, AMYLASE in the last 168 hours. No results for input(s): AMMONIA in the last 168 hours. CBC: Recent Labs  Lab 04/15/18 1706 04/17/18 1047 04/18/18 0254  WBC 4.1 7.5 8.6  NEUTROABS 2.2 4.5  --   HGB 12.7 13.0 12.1  HCT 39.9 40.4 38.9  MCV 92.8 92.4 96.5  PLT 218 228 220   Cardiac Enzymes: No results for input(s): CKTOTAL, CKMB, CKMBINDEX, TROPONINI  in the last 168 hours. BNP: Invalid input(s): POCBNP CBG: Recent Labs  Lab 04/18/18 0750 04/18/18 1150  04/18/18 1658 04/19/18 0002 04/19/18 0735  GLUCAP 215* 184* 126* 171* 127*   D-Dimer No results for input(s): DDIMER in the last 72 hours. Hgb A1c Recent Labs    04/17/18 1805  HGBA1C 5.8*   Lipid Profile No results for input(s): CHOL, HDL, LDLCALC, TRIG, CHOLHDL, LDLDIRECT in the last 72 hours. Thyroid function studies Recent Labs    04/18/18 0254  TSH 0.359   Anemia work up No results for input(s): VITAMINB12, FOLATE, FERRITIN, TIBC, IRON, RETICCTPCT in the last 72 hours. Urinalysis    Component Value Date/Time   COLORURINE YELLOW 02/22/2013 1418   APPEARANCEUR CLOUDY (A) 02/22/2013 1418   LABSPEC 1.013 02/22/2013 1418   PHURINE 5.5 02/22/2013 1418   GLUCOSEU NEGATIVE 02/22/2013 1418   HGBUR NEGATIVE 02/22/2013 1418   BILIRUBINUR NEGATIVE 02/22/2013 1418   KETONESUR NEGATIVE 02/22/2013 1418   PROTEINUR NEGATIVE 02/22/2013 1418   UROBILINOGEN 0.2 02/22/2013 1418   NITRITE NEGATIVE 02/22/2013 1418   LEUKOCYTESUR NEGATIVE 02/22/2013 1418   Sepsis Labs Invalid input(s): PROCALCITONIN,  WBC,  LACTICIDVEN Microbiology Recent Results (from the past 240 hour(s))  Respiratory Panel by PCR     Status: None   Collection Time: 04/17/18 12:48 PM  Result Value Ref Range Status   Adenovirus NOT DETECTED NOT DETECTED Final   Coronavirus 229E NOT DETECTED NOT DETECTED Final    Comment: (NOTE) The Coronavirus on the Respiratory Panel, DOES NOT test for the novel  Coronavirus (2019 nCoV)    Coronavirus HKU1 NOT DETECTED NOT DETECTED Final   Coronavirus NL63 NOT DETECTED NOT DETECTED Final   Coronavirus OC43 NOT DETECTED NOT DETECTED Final   Metapneumovirus NOT DETECTED NOT DETECTED Final   Rhinovirus / Enterovirus NOT DETECTED NOT DETECTED Final   Influenza A NOT DETECTED NOT DETECTED Final   Influenza B NOT DETECTED NOT DETECTED Final   Parainfluenza Virus 1 NOT DETECTED NOT DETECTED Final   Parainfluenza Virus 2 NOT DETECTED NOT DETECTED Final   Parainfluenza Virus 3  NOT DETECTED NOT DETECTED Final   Parainfluenza Virus 4 NOT DETECTED NOT DETECTED Final   Respiratory Syncytial Virus NOT DETECTED NOT DETECTED Final   Bordetella pertussis NOT DETECTED NOT DETECTED Final   Chlamydophila pneumoniae NOT DETECTED NOT DETECTED Final   Mycoplasma pneumoniae NOT DETECTED NOT DETECTED Final    Comment: Performed at [facility], [place]. [address] [place], [place] [address]  MRSA PCR Screening     Status: None   Collection Time: 04/17/18  5:10 PM  Result Value Ref Range Status   MRSA by PCR NEGATIVE NEGATIVE Final    Comment:        The GeneXpert MRSA Assay (FDA approved for NASAL specimens only), is one component of a comprehensive MRSA colonization surveillance program. It is not intended to diagnose MRSA infection nor to guide or monitor treatment for MRSA infections. Performed at [facility], [place] [address] [place], [place] [address]      Time coordinating discharge: Over 30 minutes  SIGNED:   Donnamarie Poag [place] DO  [facility] 04/19/2018, 8:13 AM

## 2018-04-19 NOTE — Discharge Instructions (Signed)
Asthma Attack    Acute bronchospasm caused by asthma is also referred to as an asthma attack. Bronchospasm means that the air passages become narrowed or "tight," which limits the amount of oxygen that can get into the lungs. The narrowing is caused by inflammation and tightening of the muscles in the air tubes (bronchi) in the lungs. Excessive mucus is also produced, which narrows the airways more. This can cause trouble breathing, coughing, and loud breathing (wheezing).  What are the causes?  Possible triggers include:  · Animal dander from the skin, hair, or feathers of animals.  · Dust mites contained in house dust.  · Cockroaches.  · Pollen from trees or grass.  · Mold.  · Cigarette or tobacco smoke.  · Air pollutants such as dust, household cleaners, hair sprays, aerosol sprays, paint fumes, strong chemicals, or strong odors.  · Cold air or weather changes. Cold air may trigger inflammation. Winds increase molds and pollens in the air.  · Strong emotions such as crying or laughing hard.  · Stress.  · Certain medicines, such as aspirin or beta-blockers.  · Sulfites in foods and drinks, such as dried fruits and wine.  · Infections or inflammatory conditions, such as a flu, a cold, pneumonia, or inflammation of the nasal membranes (rhinitis).  · Gastroesophageal reflux disease (GERD). GERD is a condition in which stomach acid backs up into your esophagus, which can irritate nearby airway structures.  · Exercise or activity that requires a lot of energy.  What are the signs or symptoms?  Symptoms of this condition include:  · Wheezing. This may sound like whistling while breathing. This may be more noticeable at night.  · Excessive coughing, particularly at night.  · Chest tightness or pain.  · Shortness of breath.  · Feeling like you cannot get enough air no matter how hard you try (air hunger).  How is this diagnosed?  This condition may be diagnosed based on:  · Your medical history.  · Your symptoms.  · A  physical exam.  · Tests to check for other causes of your symptoms or other conditions that may have triggered your asthma attack. These tests may include:  ? Chest X-ray.  ? Blood tests.  ? Specialized tests to assess lung function, such as breathing into a device that measures how much air you inhale and exhale (spirometry).  How is this treated?  The goal of treatment is to open the airways in your lungs and reduce inflammation. Most asthma attacks are treated with medicines that you inhale through a hand-held inhaler (metered dose inhaler, MDI) or a device that turns liquid medicine into a mist that you inhale (nebulizer). Medicines may include:  · Quick relief or rescue medicines that relax the muscles of the bronchi. These medicines include bronchodilators, such as albuterol.  · Controller medicines, such as inhaled corticosteroids. These are long-acting medicines that are used for daily asthma maintenance.  If you have a moderate or severe asthma attack, you may be treated with steroid medicines by mouth or through an IV injection at the hospital. Steroid medicines reduce inflammation in your lungs. Depending on the severity of your attack, you may need oxygen therapy to help you breathe.  If your asthma attack was caused by a bacterial infection, such as pneumonia, you will be given antibiotic medicines.  Follow these instructions at home:  Medicines  · Take over-the-counter and prescription medicines only as told by your health care provider. Keep your medicines   up-to-date and available.  · If you are more than [redacted] weeks pregnant and you are prescribed any new medicines, tell your obstetrician about those medicines.  · If you were prescribed an antibiotic medicine, take it as told by your health care provider. Do not stop taking the antibiotic even if you start to feel better.  Avoiding triggers    · Keep track of things that trigger your asthma attacks or cause you to have breathing problems, and avoid  exposure to these triggers.  · Do not use any products that contain nicotine or tobacco, such as cigarettes and e-cigarettes. If you need help quitting, ask your health care provider.  · Avoid secondhand smoke.  · Avoid strong smells, such as perfumes, aerosols, and cleaning solvents.  · When pollen or air pollution is bad, keep windows closed and use an air conditioner or go to places with air conditioning.  Asthma action plan  · Work with your health care provider to make a written plan for managing and treating your asthma attacks (asthma action plan). This plan should include:  ? A list of your asthma triggers and how to avoid them.  ? Information about when your medicines should be taken and when their dosage should be changed.  ? Instructions about using a device called a peak flow meter to monitor your condition. A peak flow meter measures how well your lungs are working and measures how severe your asthma is at a given time. Your "personal best" is the highest peak flow rate you can reach when you feel good and have no asthma symptoms.  General instructions  · Avoid excessive exercise or activity until your asthma attack resolves. Ask your health care provider what activities are safe for you and when you can return to your normal activities.  · Stay up to date on all vaccinations recommended by your health care provider, such as flu and pneumonia vaccines.  · Drink enough fluid to keep your urine clear or pale yellow. Staying hydrated helps keep mucus in your lungs thin so it can be coughed up easily.  · If you drink caffeine, do so in moderation.  · Do not use alcohol until you have recovered.  · Keep all follow-up visits as told by your health care provider. This is important. Asthma requires careful medical care, and you and your health care provider can work together to reduce the likelihood of future attacks.  Contact a health care provider if:  · Your peak flow reading is still at 50-79% of your  personal best after you have followed your action plan for 1 hour. This is in the yellow zone, which means "caution."  · You need to use a reliever medicine more than 2-3 times a week.  · Your medicines are causing side effects, such as:  ? Rash.  ? Itching.  ? Swelling.  ? Trouble breathing.  · Your symptoms do not improve after 48 hours.  · You cough up mucus (sputum) that is thicker than usual.  · You have a fever.  · You need to use your medicines much more frequently than normal.  Get help right away if:  · Your peak flow reading is less than 50% of your personal best. This is in the red zone, which means "danger."  · You have severe trouble breathing.  · You develop chest pain or discomfort.  · Your medicines no longer seem to be helping.  · You vomit.  · You cannot   eat or drink without vomiting.  · You are coughing up yellow, green, brown, or bloody mucus.  · You have a fever and your symptoms suddenly get worse.  · You have trouble swallowing.  · You feel very tired, and breathing becomes tiring.  Summary  · Acute bronchospasm caused by asthma is also referred to as an asthma attack.  · Bronchospasm is caused by narrowing or tightness in air passages, which causes shortness of breath, coughing, and loud breathing (wheezing).  · Many things can trigger an asthma attack, such as allergens, weather changes, exercise, smoke, and other fumes.  · Treatment for an asthma attack may include inhaled rescue medicines for immediate relief, as well as the use of maintenance therapy.  · Get help right away if you have worsening shortness of breath, chest pain, or fever, or if your home medicines are no longer helping with your symptoms.  This information is not intended to replace advice given to you by your health care provider. Make sure you discuss any questions you have with your health care provider.  Document Released: 06/01/2006 Document Revised: 03/18/2016 Document Reviewed: 03/18/2016  Elsevier Interactive  Patient Education © 2019 [employer]

## 2018-04-23 ENCOUNTER — Telehealth: Payer: Self-pay | Admitting: Hematology & Oncology

## 2018-04-23 ENCOUNTER — Inpatient Hospital Stay: Payer: BLUE CROSS/BLUE SHIELD | Attending: Family

## 2018-04-23 ENCOUNTER — Other Ambulatory Visit: Payer: Self-pay

## 2018-04-23 ENCOUNTER — Inpatient Hospital Stay (HOSPITAL_BASED_OUTPATIENT_CLINIC_OR_DEPARTMENT_OTHER): Payer: BLUE CROSS/BLUE SHIELD | Admitting: Hematology & Oncology

## 2018-04-23 ENCOUNTER — Encounter: Payer: Self-pay | Admitting: Hematology & Oncology

## 2018-04-23 VITALS — BP 122/91 | HR 102 | Temp 98.2°F | Resp 16 | Wt 238.0 lb

## 2018-04-23 DIAGNOSIS — Z79899 Other long term (current) drug therapy: Secondary | ICD-10-CM | POA: Insufficient documentation

## 2018-04-23 DIAGNOSIS — D508 Other iron deficiency anemias: Secondary | ICD-10-CM

## 2018-04-23 DIAGNOSIS — C4362 Malignant melanoma of left upper limb, including shoulder: Secondary | ICD-10-CM | POA: Diagnosis present

## 2018-04-23 DIAGNOSIS — I890 Lymphedema, not elsewhere classified: Secondary | ICD-10-CM | POA: Diagnosis not present

## 2018-04-23 DIAGNOSIS — J4520 Mild intermittent asthma, uncomplicated: Secondary | ICD-10-CM

## 2018-04-23 DIAGNOSIS — J45909 Unspecified asthma, uncomplicated: Secondary | ICD-10-CM

## 2018-04-23 DIAGNOSIS — D509 Iron deficiency anemia, unspecified: Secondary | ICD-10-CM

## 2018-04-23 DIAGNOSIS — C4372 Malignant melanoma of left lower limb, including hip: Secondary | ICD-10-CM

## 2018-04-23 DIAGNOSIS — Z85820 Personal history of malignant melanoma of skin: Secondary | ICD-10-CM

## 2018-04-23 DIAGNOSIS — R778 Other specified abnormalities of plasma proteins: Secondary | ICD-10-CM

## 2018-04-23 LAB — CMP (CANCER CENTER ONLY)
ALT: 17 U/L (ref 0–44)
AST: 10 U/L — ABNORMAL LOW (ref 15–41)
Albumin: 4.2 g/dL (ref 3.5–5.0)
Alkaline Phosphatase: 88 U/L (ref 38–126)
Anion gap: 10 (ref 5–15)
BUN: 27 mg/dL — ABNORMAL HIGH (ref 6–20)
CO2: 25 mmol/L (ref 22–32)
CREATININE: 0.98 mg/dL (ref 0.44–1.00)
Calcium: 8.9 mg/dL (ref 8.9–10.3)
Chloride: 103 mmol/L (ref 98–111)
GFR, Est AFR Am: >60 mL/min (ref >60)
GFR, Estimated: >60 mL/min (ref >60)
Glucose, Bld: 91 mg/dL (ref 70–99)
Potassium: 3.3 mmol/L — ABNORMAL LOW (ref 3.5–5.1)
Sodium: 138 mmol/L (ref 135–145)
Total Bilirubin: 0.6 mg/dL (ref 0.3–1.2)
Total Protein: 6.4 g/dL — ABNORMAL LOW (ref 6.5–8.1)

## 2018-04-23 LAB — CBC WITH DIFFERENTIAL (CANCER CENTER ONLY)
Abs Immature Granulocytes: 0.48 10*3/uL — ABNORMAL HIGH (ref 0.00–0.07)
BASOS ABS: 0 10*3/uL (ref 0.0–0.1)
BASOS PCT: 0 %
Eosinophils Absolute: 0.2 10*3/uL (ref 0.0–0.5)
Eosinophils Relative: 2 %
HCT: 40.5 % (ref 36.0–46.0)
Hemoglobin: 13.1 g/dL (ref 12.0–15.0)
Immature Granulocytes: 6 %
Lymphocytes Relative: 34 %
Lymphs Abs: 3 10*3/uL (ref 0.7–4.0)
MCH: 29.6 pg (ref 26.0–34.0)
MCHC: 32.3 g/dL (ref 30.0–36.0)
MCV: 91.6 fL (ref 80.0–100.0)
Monocytes Absolute: 0.6 10*3/uL (ref 0.1–1.0)
Monocytes Relative: 7 %
Neutro Abs: 4.5 10*3/uL (ref 1.7–7.7)
Neutrophils Relative %: 51 %
Platelet Count: 195 10*3/uL (ref 150–400)
RBC: 4.42 MIL/uL (ref 3.87–5.11)
RDW: 13.1 % (ref 11.5–15.5)
WBC Count: 8.8 10*3/uL (ref 4.0–10.5)
nRBC: 0.3 % — ABNORMAL HIGH (ref 0.0–0.2)

## 2018-04-23 MED ORDER — AMOXICILLIN-POT CLAVULANATE 875-125 MG PO TABS: 1.0000 | ORAL_TABLET | Freq: Two times a day (BID) | ORAL | 0 refills | Status: DC

## 2018-04-23 MED ORDER — PANTOPRAZOLE SODIUM 40 MG PO TBEC: 40.0000 mg | DELAYED_RELEASE_TABLET | Freq: Every day | ORAL | 3 refills | Status: DC

## 2018-04-23 MED ORDER — IPRATROPIUM-ALBUTEROL 0.5-2.5 (3) MG/3ML IN SOLN
RESPIRATORY_TRACT | Status: AC
  Filled 2018-04-23: qty 3

## 2018-04-23 MED ORDER — IPRATROPIUM-ALBUTEROL 0.5-2.5 (3) MG/3ML IN SOLN
3.0000 mL | Freq: Four times a day (QID) | RESPIRATORY_TRACT | Status: DC
  Administered 2018-04-23: 3 mL via RESPIRATORY_TRACT

## 2018-04-23 NOTE — Telephone Encounter (Signed)
Appointments scheduled calendar printed per 2/24 los °

## 2018-04-23 NOTE — Progress Notes (Signed)
Hematology and Oncology Follow Up Visit  Skyy Nilan [id number] 09/29/1960 58 y.o. 04/23/2018   Principle Diagnosis:  Malignant melanoma of the left thigh, stage IIIb, one positive lymph node Melanoma of right calf and left upper arm treated with Mohs procedure  Iron deficiency anemia   Current Therapy:   Observation  IV iron as indicated - last received in May x 2   Interim History:  Ms. Sloss is here today for follow-up.  Unfortunately, she just was out of the hospital.  She had influenza A.  She had her asthma flareup.  She was discharged.  She is still having problems with her cough.  She is having some shortness of breath.  I think she needs another nebulizer.  I suspect that she also is going to need some antibiotics.  As far as the melanoma is concerned, this really is not a problem for her.  She does have some lymphedema in the left leg but this seems to be fairly well controlled.  There is been no fever.  She is had no bleeding.  She has had no change in bowel or bladder habits.  She has a job in [place].  She can had to move to [place].  However, she said that she will be more than willing to come back and see her doctors in [place].  Overall, her performance status is ECOG 1.   Medications:  Allergies as of 04/23/2018      Reactions   Other Other (See Comments)   Cant take some "mycin" drugs    Tape Other (See Comments)   Blister, irritates skin      Medication List       Accurate as of April 23, 2018 12:24 PM. Always use your most recent med list.        albuterol 108 (90 Base) MCG/ACT inhaler Commonly known as:  PROVENTIL HFA;VENTOLIN HFA Inhale 1 puff into the lungs every 6 (six) hours as needed for wheezing or shortness of breath.   ATROVENT HFA 17 MCG/ACT inhaler Generic drug:  ipratropium   buPROPion 200 MG 12 hr tablet Commonly known as:  WELLBUTRIN SR Take 1 tablet (200 mg total) by mouth daily.   EPINEPHrine 0.3 mg/0.3 mL  Soaj injection Commonly known as:  EPI-PEN   EYE DROPS OP Apply 1 drop to eye daily as needed (dry eyes).   Fluticasone Propionate 93 MCG/ACT Exhu Commonly known as:  XHANCE Place 2 puffs into the nose 2 (two) times daily.   HYDROcodone-acetaminophen 5-325 MG tablet Commonly known as:  NORCO/VICODIN Take 1 tablet by mouth every 6 (six) hours as needed for moderate pain.   HYDROcodone-homatropine 5-1.5 MG/5ML syrup Commonly known as:  HYCODAN Take 5 mLs by mouth every 6 (six) hours as needed for cough.   ipratropium 0.03 % nasal spray Commonly known as:  ATROVENT Place 2 sprays into both nostrils 4 (four) times daily.   ipratropium-albuterol 0.5-2.5 (3) MG/3ML Soln Commonly known as:  DUONEB Take 3 mLs by nebulization every 4 (four) hours as needed (wheezing or shortness of breath).   metFORMIN 500 MG tablet Commonly known as:  GLUCOPHAGE Take 1 tablet (500 mg total) by mouth daily with breakfast.   montelukast 10 MG tablet Commonly known as:  SINGULAIR Take 1 tablet (10 mg total) by mouth at bedtime.   pantoprazole 40 MG tablet Commonly known as:  PROTONIX Take 1 tablet (40 mg total) by mouth daily for 30 days.   pramipexole 1.5 MG tablet Commonly  known as:  MIRAPEX TAKE 2 TABLETS (3 MG TOTAL) BY MOUTH 2 (TWO) TIMES DAILY AS NEEDED (FOR RESTLESS LEG SYNDROME   predniSONE 5 MG tablet Commonly known as:  DELTASONE Take 8 tablets (40 mg total) by mouth daily with breakfast for 2 days, THEN 4 tablets (20 mg total) daily with breakfast for 2 days, THEN 2 tablets (10 mg total) daily with breakfast for 2 days, THEN 1 tablet (5 mg total) daily with breakfast for 2 days. Start taking on:  April 19, 2018   tiZANidine 4 MG capsule Commonly known as:  ZANAFLEX Take 4 mg by mouth at bedtime.   topiramate 50 MG tablet Commonly known as:  TOPAMAX Take 1 tablet (50 mg total) by mouth daily.   Vitamin D (Ergocalciferol) 1.25 MG (50000 UT) Caps capsule Commonly known as:   DRISDOL Take 1 capsule (50,000 Units total) by mouth every 7 (seven) days.   Vitamin D3 1.25 MG (50000 UT) Caps TAKE 1 CAPSULE BY MOUTH ONE TIME PER WEEK       Allergies:  Allergies  Allergen Reactions  . Other Other (See Comments)    Cant take some "mycin" drugs   . Tape Other (See Comments)    Blister, irritates skin    Past Medical History, Surgical history, Social history, and Family History were reviewed and updated.  Review of Systems: Review of Systems  Respiratory: Positive for cough, shortness of breath and wheezing.   All other systems reviewed and are negative.     Physical Exam:  weight is 238 lb (108 kg). Her oral temperature is 98.2 F (36.8 C). Her blood pressure is 122/91 (abnormal) and her pulse is 102 (abnormal). Her respiration is 16 and oxygen saturation is 98%.   Wt Readings from Last 3 Encounters:  04/23/18 238 lb (108 kg)  04/17/18 241 lb 6.5 oz (109.5 kg)  04/15/18 243 lb (110.2 kg)    Physical Exam Vitals signs reviewed.  HENT:     Head: Normocephalic and atraumatic.  Eyes:     Pupils: Pupils are equal, round, and reactive to light.  Neck:     Musculoskeletal: Normal range of motion.  Cardiovascular:     Rate and Rhythm: Normal rate and regular rhythm.     Heart sounds: Normal heart sounds.  Pulmonary:     Effort: Pulmonary effort is normal.     Breath sounds: Normal breath sounds.     Comments: Pulmonary examination shows wheezes and rhonchi on the right side.  Left side sounds relatively clear. Abdominal:     General: Bowel sounds are normal.     Palpations: Abdomen is soft.  Musculoskeletal: Normal range of motion.        General: No tenderness or deformity.  Lymphadenopathy:     Cervical: No cervical adenopathy.  Skin:    General: Skin is warm and dry.     Findings: No erythema or rash.  Neurological:     Mental Status: She is alert and oriented to person, place, and time.  Psychiatric:        Behavior: Behavior normal.          Thought Content: Thought content normal.        Judgment: Judgment normal.      Lab Results  Component Value Date   WBC 8.8 04/23/2018   HGB 13.1 04/23/2018   HCT 40.5 04/23/2018   MCV 91.6 04/23/2018   PLT 195 04/23/2018   Lab Results  Component Value Date  FERRITIN 41 12/21/2017   IRON 69 12/21/2017   TIBC 346 12/21/2017   UIBC 277 12/21/2017   IRONPCTSAT 20 (L) 12/21/2017   Lab Results  Component Value Date   RBC 4.42 04/23/2018   No results found for: [facility] Lab Results  Component Value Date   IGGSERUM 888 01/05/2018   IGA 461 (H) 01/05/2018   IGMSERUM 90 01/05/2018   Lab Results  Component Value Date   TOTALPROTELP 6.9 01/05/2018   ALBUMINELP 4.5 12/18/2017   A1GS 0.3 12/18/2017   A2GS 0.8 12/18/2017   BETS 0.6 12/18/2017   BETA2SER 0.6 (H) 12/18/2017   GAMS 1.0 12/18/2017   SPEI  12/18/2017     Comment:     . Electrophoretic studies reveal an isolated elevation  of beta-2 globulins. This pattern is suggestive of  acute inflammation; however, the presence of a  monoclonal protein cannot be ruled out. Consider serum immunofixation to rule out monoclonal protein (if not  already ordered). . . Immunofixation analysis is available if identification of the band(s) is clinically indicated. .      Chemistry      Component Value Date/Time   NA 138 04/23/2018 1106   NA 140 10/24/2017 0935   K 3.3 (L) 04/23/2018 1106   CL 103 04/23/2018 1106   CO2 25 04/23/2018 1106   BUN 27 (H) 04/23/2018 1106   BUN 17 10/24/2017 0935   CREATININE 0.98 04/23/2018 1106      Component Value Date/Time   CALCIUM 8.9 04/23/2018 1106   ALKPHOS 88 04/23/2018 1106   AST 10 (L) 04/23/2018 1106   ALT 17 04/23/2018 1106   BILITOT 0.6 04/23/2018 1106       Impression and Plan: Ms. Richeson is a very pleasant 58 yo caucasian female with history of malignant melnome stage III of the left posterior thigh, one positive lymph node  diagnosed in September 2015. She also has history of melanoma of the right calf and left upper arm treated with Moh's surgery. No chemotherapy.   For right now, I am most worried about this pulmonary situation.  I hate that she had to be hospitalized.  I do think that she would benefit from some antibiotics.  I am going to call in some Augmentin for her (875 mg p.o. twice daily x10 days) to see if this does not help a little bit.  She will get a DuoNeb in the office today.  We will plan to have her come back to see [name] in another 6 months.  I think this is reasonable.  She is about 5 years out from her stage III melanoma.  She sees her dermatologist twice a year.   Volanda Napoleon, MD 2/24/202012:24 PM

## 2018-04-24 LAB — KAPPA/LAMBDA LIGHT CHAINS
Kappa free light chain: 15.6 mg/L (ref 3.3–19.4)
Kappa, lambda light chain ratio: 1.42 (ref 0.26–1.65)
Lambda free light chains: 11 mg/L (ref 5.7–26.3)

## 2018-04-24 LAB — IGG, IGA, IGM
IgA: 459 mg/dL — ABNORMAL HIGH (ref 87–352)
IgG (Immunoglobin G), Serum: 836 mg/dL (ref 700–1600)
IgM (Immunoglobulin M), Srm: 103 mg/dL (ref 26–217)

## 2018-04-24 LAB — FERRITIN: Ferritin: 140 ng/mL (ref 11–307)

## 2018-04-24 LAB — IRON AND TIBC
Iron: 150 ug/dL — ABNORMAL HIGH (ref 41–142)
Saturation Ratios: 55 % (ref 21–57)
TIBC: 274 ug/dL (ref 236–444)
UIBC: 124 ug/dL (ref 120–384)

## 2018-04-24 NOTE — Addendum Note (Signed)
Addended by: Volanda Napoleon on: 04/24/2018 07:23 AM   Modules accepted: Orders

## 2018-04-25 ENCOUNTER — Telehealth: Payer: Self-pay | Admitting: Hematology & Oncology

## 2018-04-25 LAB — PROTEIN ELECTROPHORESIS, SERUM
A/G Ratio: 1.3 (ref 0.7–1.7)
Albumin ELP: 3.7 g/dL (ref 2.9–4.4)
Alpha-1-Globulin: 0.2 g/dL (ref 0.0–0.4)
Alpha-2-Globulin: 0.8 g/dL (ref 0.4–1.0)
Beta Globulin: 1.3 g/dL (ref 0.7–1.3)
GLOBULIN, TOTAL: 2.9 g/dL (ref 2.2–3.9)
Gamma Globulin: 0.7 g/dL (ref 0.4–1.8)
TOTAL PROTEIN ELP: 6.6 g/dL (ref 6.0–8.5)

## 2018-04-25 NOTE — Telephone Encounter (Signed)
LMVM for patient with date/time/location/NPO 6 hrs prior/NO CARBS 12 hours prior and arrival time of 8:30

## 2018-05-04 ENCOUNTER — Other Ambulatory Visit: Payer: Self-pay | Admitting: Allergy

## 2018-05-17 ENCOUNTER — Telehealth: Payer: Self-pay | Admitting: Family Medicine

## 2018-05-17 DIAGNOSIS — R52 Pain, unspecified: Secondary | ICD-10-CM

## 2018-05-17 MED ORDER — HYDROCODONE-ACETAMINOPHEN 5-325 MG PO TABS: 1.0000 | ORAL_TABLET | Freq: Four times a day (QID) | ORAL | 0 refills | Status: DC | PRN

## 2018-05-17 NOTE — Telephone Encounter (Signed)
Copied from [place] [id number]. Topic: Quick Communication - Rx Refill/Question >> May 17, 2018  1:48 PM [place], [place] D wrote: Medication: HYDROcodone-acetaminophen (NORCO/VICODIN) 5-325 MG tablet / Pt has a new pharmacy due to moving and needs new rx refill sent in.  Has the patient contacted their pharmacy? Yes.   (Agent: If no, request that the patient contact the pharmacy for the refill.) (Agent: If yes, when and what did the pharmacy advise?)  Preferred Pharmacy (with phone number or street name): [id number] - [place], [place] [address] [phone] (Phone) [phone] (Fax)  Agent: Please be advised that RX refills may take up to 3 business days. We ask that you follow-up with your pharmacy.

## 2018-05-17 NOTE — Telephone Encounter (Signed)
04/19/2018  4   04/19/2018  Hydrocodone-Homatropine Syrup  120.00 6 Er Aus   [id number]   Nor ([id number])   0  20.00 MME  Comm Ins   [place]  02/23/2018  4   02/23/2018  Hydrocodone-Acetamin 5-325 MG  90.00 22 Je Cop   [id number]   Nor ([id number])   0  20.45 MME  Comm Ins   [place]  01/23/2018  4   01/23/2018  Hydrocodone-Acetamin 5-325 MG  60.00 15 Je Cop   [id number]   Nor ([id number])   0  20.00 MME  Comm Ins   [place]  01/22/2018  4   01/22/2018  Oxycodone-Acetaminophen 5-325  10.00 1 An All   [id number]   Nor ([id number])   0  75.00 MME  Comm Ins   [place]  12/20/2017  4   12/20/2017  Hydrocodone-Acetamin 5-325 MG  60.00 15 Je Cop   [id number]   Nor ([id number])   0  20.00 MME  Comm Ins   [place]  11/07/2017  4   11/07/2017  Hydrocodone-Acetamin 5-325 MG  60.00 15 Je Cop   [id number]   Nor ([id number])   0  20.00 MME  Comm Ins   [place]  09/27/2017  4   09/27/2017  Hydrocodone-Acetamin 5-325 MG  60.00 15 Je Cop   [id number]   Nor ([id number])   0  20.00 MME  Comm Ins      Visit is due- pt has moved?  Will send in her rx  Called her.  She has moved to [facility] but still plans to see me when she comes to see her oncologist.  Advised her that she is about due for 65-month follow-up, but due to coronavirus pandemic we certainly have more leeway.  Asked her to come and see me as soon as is feasible.  Refilled her hydrocodone.  She reports that she never actually got the hydrocodone syrup that was prescribed last month due to back order.  In any case she will not use these both together

## 2018-05-23 ENCOUNTER — Other Ambulatory Visit: Payer: Self-pay

## 2018-05-23 ENCOUNTER — Ambulatory Visit (HOSPITAL_COMMUNITY)
Admission: RE | Admit: 2018-05-23 | Discharge: 2018-05-23 | Disposition: A | Discharge status: Home / Self Care | Payer: BLUE CROSS/BLUE SHIELD | Source: Ambulatory Visit | Attending: Hematology & Oncology | Admitting: Hematology & Oncology

## 2018-05-23 DIAGNOSIS — C4372 Malignant melanoma of left lower limb, including hip: Secondary | ICD-10-CM | POA: Diagnosis not present

## 2018-05-23 LAB — GLUCOSE, CAPILLARY: Glucose-Capillary: 103 mg/dL — ABNORMAL HIGH (ref 70–99)

## 2018-05-23 MED ORDER — FLUDEOXYGLUCOSE F - 18 (FDG) INJECTION
11.8000 | Freq: Once | INTRAVENOUS | Status: AC
  Administered 2018-05-23: 11.8 via INTRAVENOUS

## 2018-05-24 ENCOUNTER — Telehealth: Payer: Self-pay | Admitting: *Deleted

## 2018-05-24 NOTE — Telephone Encounter (Addendum)
Patient is aware of results  ----- Message from Volanda Napoleon, MD sent at 05/23/2018  3:42 PM EDT ----- Please call and tell her that the PET scan does not show any evidence of lymphoma that is come back.  Laurey Arrow

## 2018-07-14 ENCOUNTER — Other Ambulatory Visit: Payer: Self-pay | Admitting: Family Medicine

## 2018-07-14 DIAGNOSIS — R600 Localized edema: Secondary | ICD-10-CM

## 2018-07-30 ENCOUNTER — Other Ambulatory Visit: Payer: Self-pay | Admitting: Family Medicine

## 2018-07-30 DIAGNOSIS — R52 Pain, unspecified: Secondary | ICD-10-CM

## 2018-07-30 DIAGNOSIS — G2581 Restless legs syndrome: Secondary | ICD-10-CM

## 2018-07-30 MED ORDER — HYDROCODONE-ACETAMINOPHEN 5-325 MG PO TABS: 1.0000 | ORAL_TABLET | Freq: Four times a day (QID) | ORAL | 0 refills | Status: DC | PRN

## 2018-07-30 MED ORDER — PRAMIPEXOLE DIHYDROCHLORIDE 1.5 MG PO TABS: ORAL_TABLET | ORAL | 1 refills | Status: DC

## 2018-07-30 NOTE — Telephone Encounter (Signed)
Copied from [place] [id number]. Topic: Quick Communication - Rx Refill/Question >> Jul 30, 2018  8:33 AM Sheran Luz wrote: Medication: pramipexole (MIRAPEX) 1.5 MG tablet and  HYDROcodone-acetaminophen (NORCO/VICODIN) 5-325 MG tablet    Patient is requesting refill.   Preferred Pharmacy (with phone number or street name):[id number] [place], [place] [address]  [phone] (Phone) [phone] (Fax)

## 2018-07-30 NOTE — Telephone Encounter (Signed)
Confirmed her dose of mirapex as it is high- she has been taking this dose for 15 years.    Meds ordered this encounter  Medications  . HYDROcodone-acetaminophen (NORCO/VICODIN) 5-325 MG tablet    Sig: Take 1 tablet by mouth every 6 (six) hours as needed for moderate pain.    Dispense:  90 tablet    Refill:  0  . pramipexole (MIRAPEX) 1.5 MG tablet    Sig: TAKE 2 TABLETS (3 MG TOTAL) BY MOUTH 2 (TWO) TIMES DAILY AS NEEDED (FOR RESTLESS LEG SYNDROME    Dispense:  344 tablet    Refill:  1   She would like to come in for a visit so I will have Mel Almond call her and schedule

## 2018-07-30 NOTE — Telephone Encounter (Signed)
Requesting: NORCO Contract: 07/12/2017 UDS: 07/12/2017 Last OV: 11/13/2017 Next OV: N/A Last Refill: 05/17/2018, #90--0 RF Database:   Please advise

## 2018-07-31 IMAGING — DX DG CHEST 2 VIEW
2 series · 2 of 2 positions shown · non-contrast
Comparison: 04/26/2017

CLINICAL DATA: Cough

EXAM:
CHEST - 2 VIEW

[chest pa]
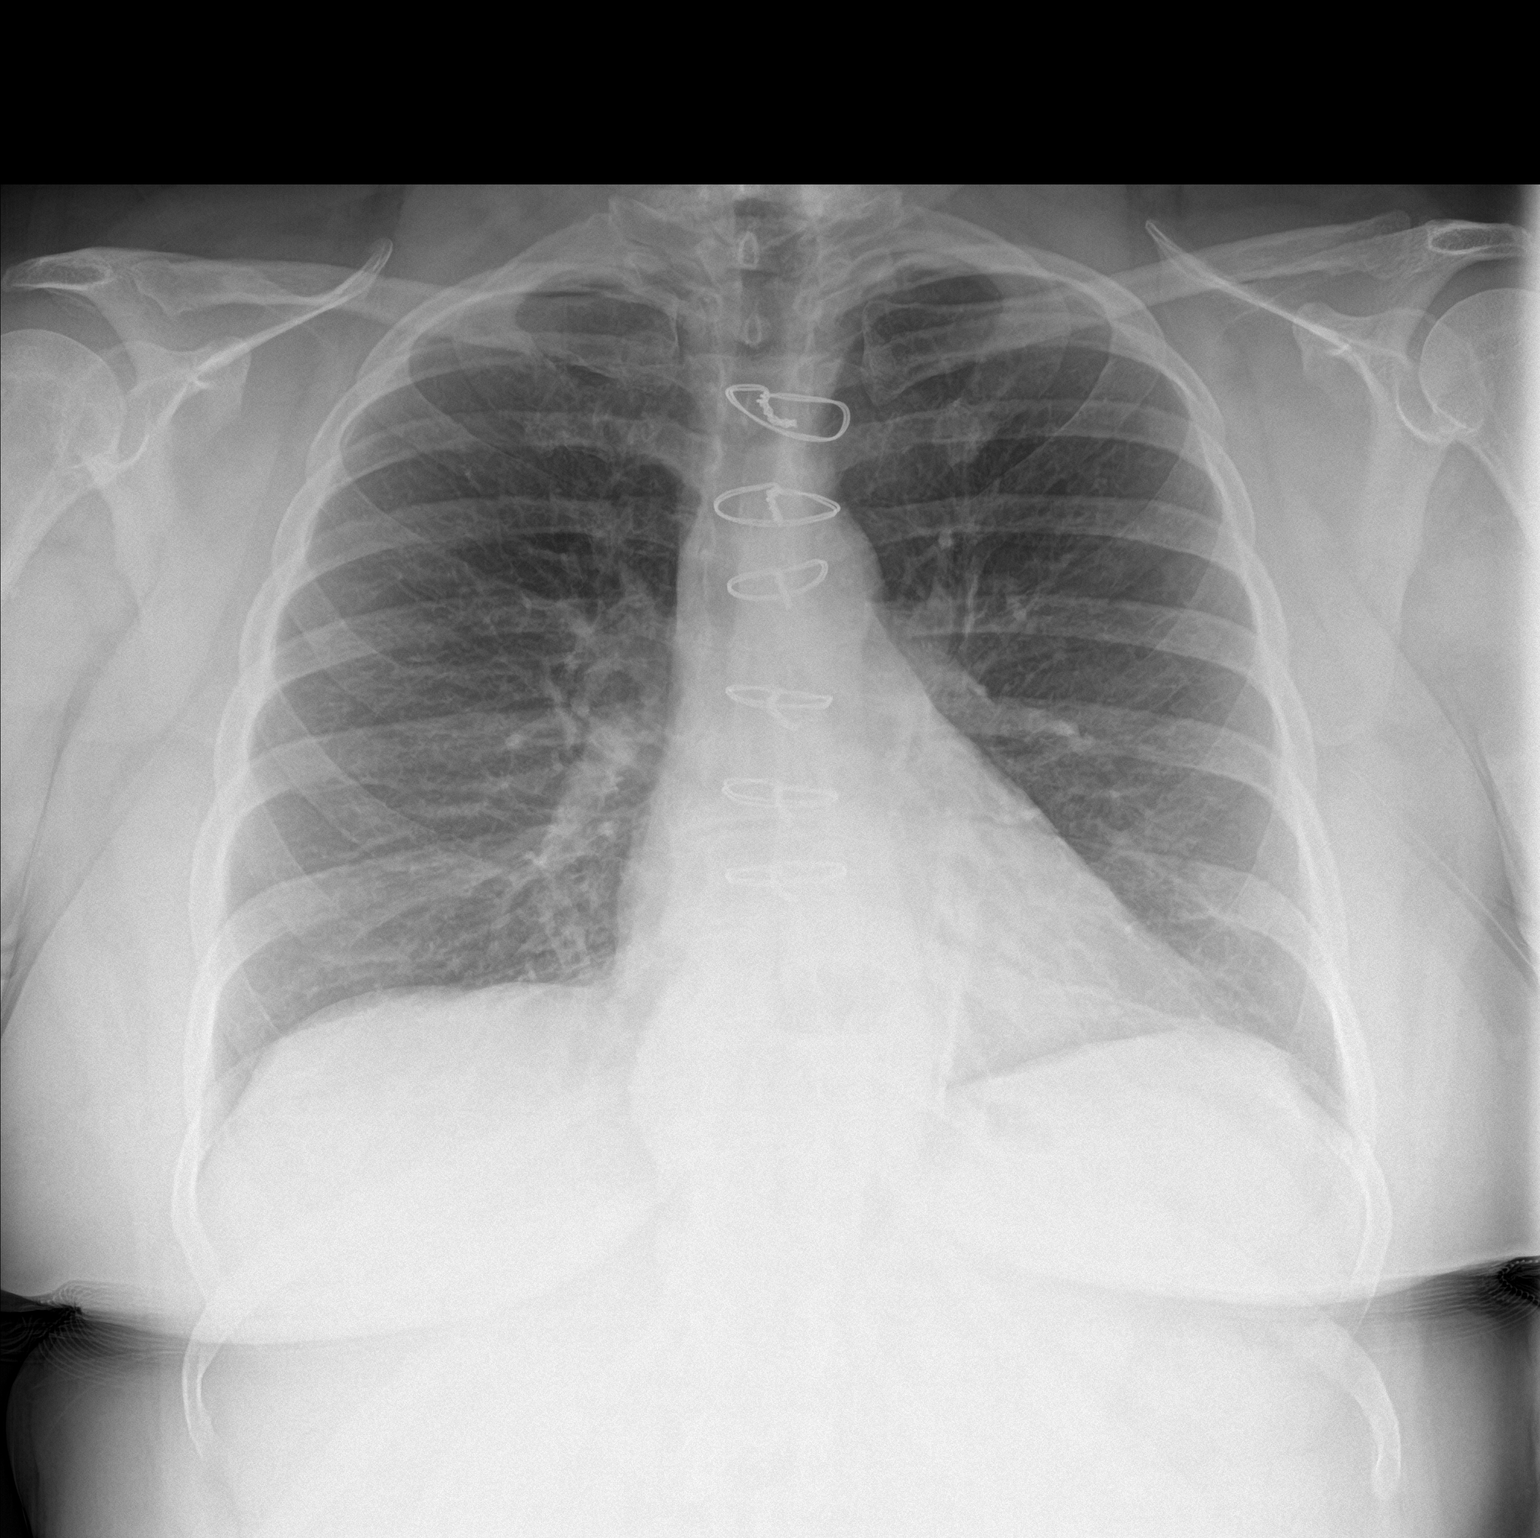

[chest lat]
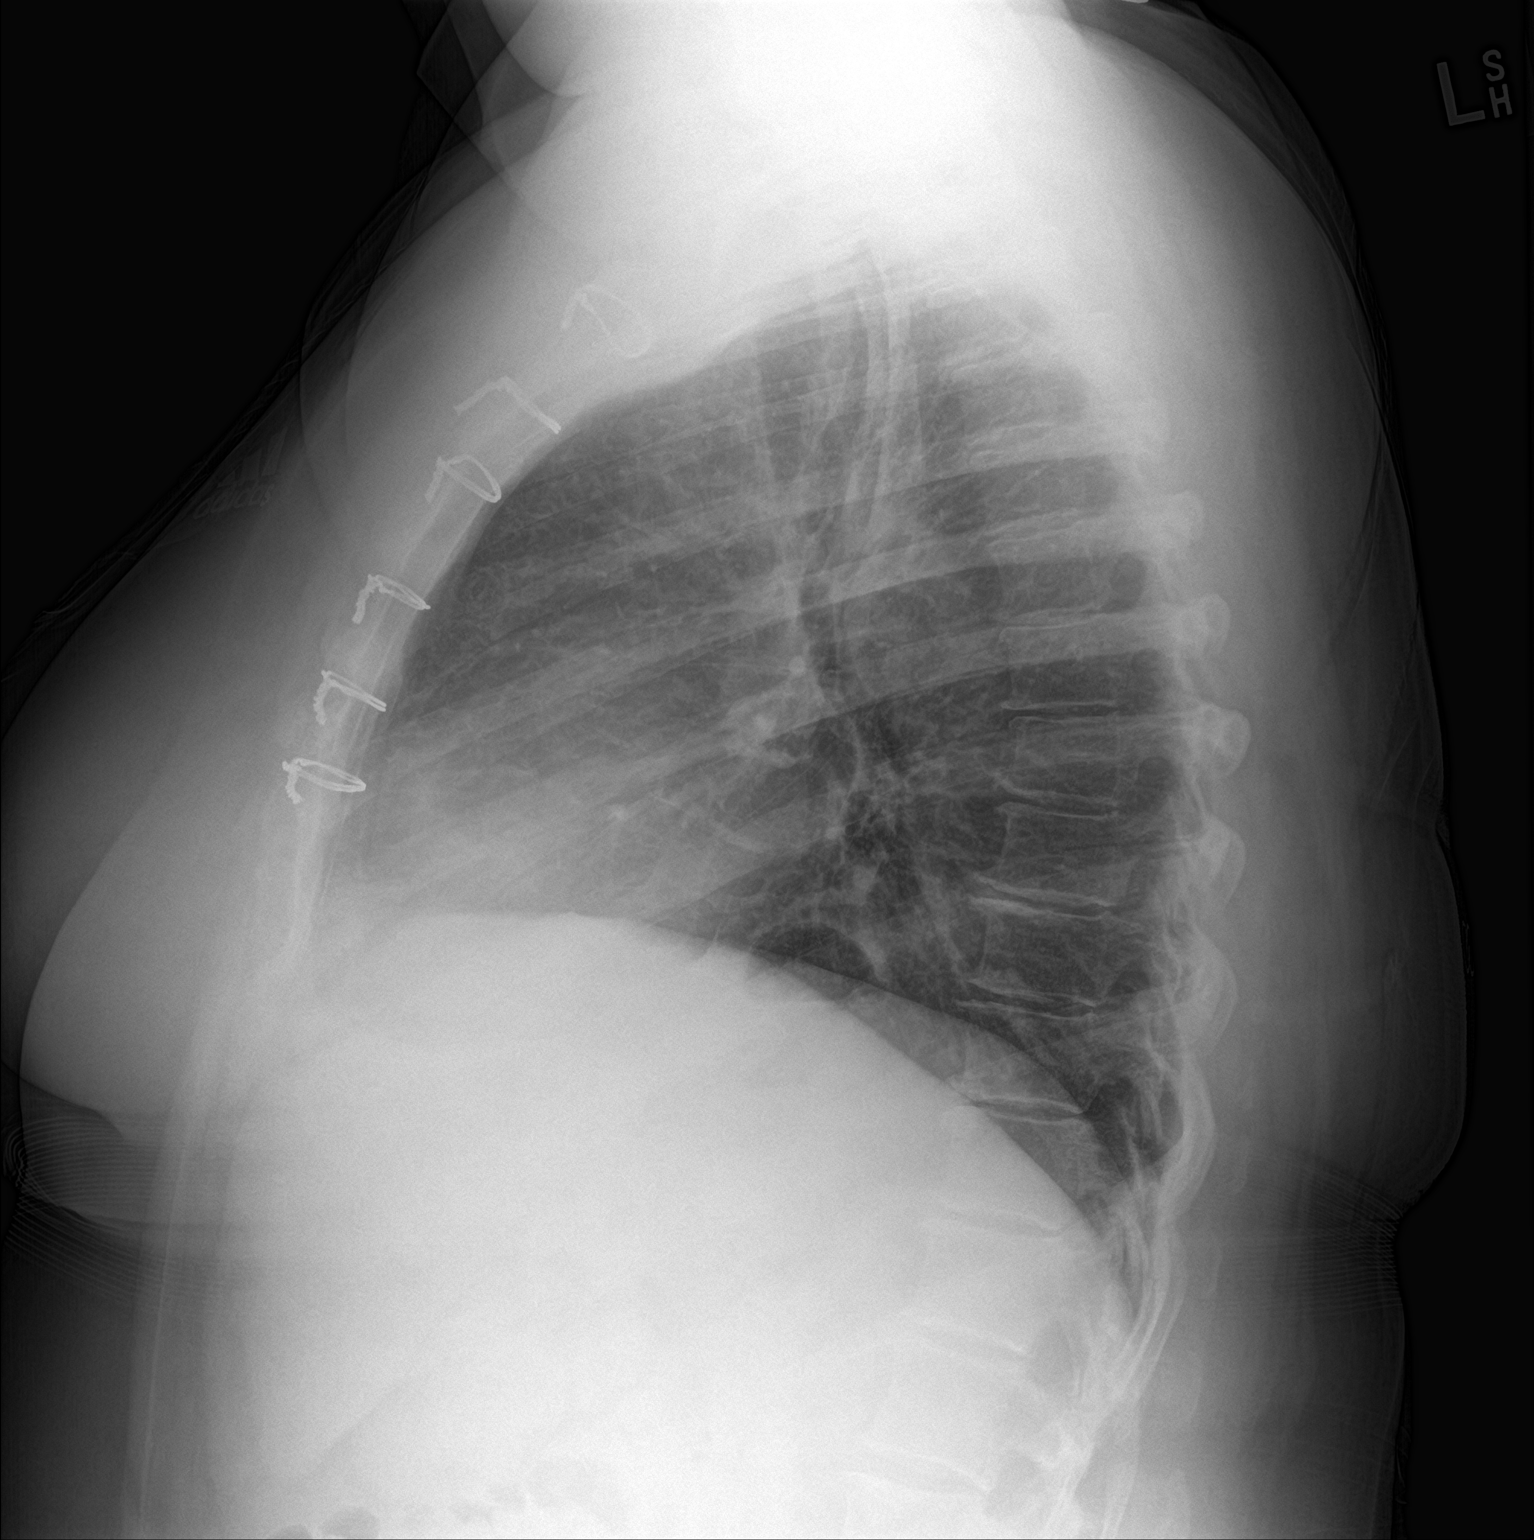

[2 of 2 positions shown; findings below may reference images not displayed]

FINDINGS: Prior median sternotomy. Moderate-sized hiatal hernia. Heart and
mediastinal contours are within normal limits. No focal opacities or
effusions. No acute bony abnormality.
IMPRESSION: Moderate-sized hiatal hernia.

No active cardiopulmonary disease.

## 2018-07-31 NOTE — Telephone Encounter (Signed)
Left message to return call to schedule in person on Thursday or virtually tomorrow.

## 2018-08-01 NOTE — Progress Notes (Addendum)
[place] at [facility] [address] [place], [place] [address] [phone]  Date:  08/02/2018   Name:  [name]   DOB:  1960-06-21   MRN:  [id number]  PCP:  Darreld Mclean, MD    Chief Complaint: Back Pain (upper back pain in between shoulder blades, limited rom, couple of weeks no known injury) and Medication Refill   History of Present Illness:  [name] is a 58 y.o. very pleasant female patient who presents with the following:  In person visit today for follow-up and medication check-I had asked her to come in as I am managing her chronic pain, and we are due for a face-to-face visit and UDS She is treated with hydrocodone 5 mg for chronic back pain  I last saw her in the office in August.  She has history of significant generalized lymphedema, lumbar spinal stenosis with chronic pain, restless leg syndrome, B12 deficiency/pernicious anemia, and myasthenia gravis, as well as melanoma.  She sees Dr. Marin Olp for hematology needs- she has history of melanoma of the left thigh as well as the right calf, left upper arm.  Dr. Marin Olp saw her in February, and plan to see her in 6 months.  She is now 5 years out from stage III melanoma  She did get admitted in February due to complications of flu and asthma, leading to hypoxia-she was treated with IV steroids and continuous nebs. I mentioned that her pulse was a bit fast today, and she relates that during her admission she was told that she was "intermittently in atrial fibrillation" but was not referred to cardiology?  Reading the discharge summary I do not see any mention of A. fib, but she is mildly tachycardic today.  Certainly I am glad to refer her to cardiology for evaluation  ?  Who is managing her myasthenia currently- she needs a new neurologist since she moved to [place].  She is not sure who she would like to see; she will do some research and let me know   Unfortunately I do not have a neurology contact in [place]  She also notes an area between her shoulder blades which has been present for about 3 weeks. She is not aware of any injury or other cause of this pain.  It bothers her most when she moves her arms or stretches her shoulders, and can also bother her at night She is on hydrocodone but even this does not help with her pain   She thinks that she had a tetanus shot about 2 years ago in [place] when she had an injury- however she is not really quite sure of the date  Lab Results  Component Value Date   HGBA1C 5.8 (H) 04/17/2018   07/30/2018  3   07/30/2018  Hydrocodone-Acetamin 5-325 MG  90.00 23 Je Cop   [id number]   [place] ([id number])   0  19.57 MME  Private Pay   [place]  05/17/2018  3   05/17/2018  Hydrocodone-Acetamin 5-325 MG  90.00 22 Je Cop   [id number]   [place] ([id number])   0  20.45 MME  Comm Ins   [place]  04/19/2018  3   04/19/2018  Hydrocodone-Homatropine Syrup  120.00 6 Er Aus   [id number]   [place] ([id number])   0  20.00 MME  Comm Ins   [place]  02/23/2018  3   02/23/2018  Hydrocodone-Acetamin 5-325 MG  90.00 22 Je Cop  [id number]   [place] ([id number])   0  20.45 MME  Comm Ins   [place]  01/23/2018  3   01/23/2018  Hydrocodone-Acetamin 5-325 MG  60.00 15 Je Cop   [id number]   [place] ([id number])   0  20.00 MME  Comm Ins   [place]  01/22/2018  3   01/22/2018  Oxycodone-Acetaminophen 5-325  10.00 1 An All   [id number]   [place] ([id number])   0  75.00 MME  Comm Ins   [place]  12/20/2017  3   12/20/2017  Hydrocodone-Acetamin 5-325 MG  60.00 15 Je Cop   [id number]   [place] ([id number])   0  20.00 MME  Comm Ins   [place]  11/07/2017  3   11/07/2017  Hydrocodone-Acetamin 5-325 MG  60.00 15 Je Cop   [id number]   [place] ([id number])   0  20.00 MME  Comm Ins   [place]  09/27/2017  3   09/27/2017  Hydrocodone-Acetamin 5-325 MG  60.00 15 Je Cop   [id number]   [place] ([id number])   0  20.00 MME  Comm Ins   [place]  08/20/2017  3   08/18/2017  Hydrocodone-Acetamin 5-325 MG  60.00 15 Je Cop   [id number]   [place] ([id number])   0  20.00 MME  Comm Ins   [place]  07/12/2017  3   07/12/2017   Hydrocodone-Acetamin 5-325 MG  60.00 15 Je Cop   [id number]   [place] ([id number])   0  20.00 MME        Patient Active Problem List   Diagnosis Date Noted  . Mild intermittent asthma without complication 26/37/8588  . Class 3 severe obesity with serious comorbidity and body mass index (BMI) of 40.0 to 44.9 in adult ([place]) 12/27/2017  . Status post total knee replacement, left 12/18/2017  . 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 Vitamin D deficiency 10/24/2017  . Hyperglycemia 10/24/2017  . Other fatigue 10/24/2017  . IDA (iron deficiency anemia) 06/21/2017  . Spondylolisthesis at L3-L4 level 03/01/2017  . Allergy to environmental factors 09/15/2016  . Chronic low back pain 09/15/2016  . Degenerative spondylolisthesis 09/15/2016  . Depression 09/15/2016  . Leg edema 09/15/2016  . Lumbosacral spondylosis without myelopathy 09/15/2016  . Osteoarthritis of knees, bilateral 09/15/2016  . Pernicious anemia 09/15/2016  . 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 Malignant melanoma of thigh, left ([place]) Stage III 11/05/2013  . CERVICAL RADICULOPATHY, LEFT 06/04/2008  . MEDIAL EPICONDYLITIS 06/04/2008  . ANEMIA-NOS 03/25/2008  . Other allergic rhinitis 03/25/2008  . B12 DEFICIENCY 06/04/2007  . Restless leg 06/04/2007  . Myasthenia gravis ([place]) 06/04/2007  . GERD 06/04/2007  . IRRITABLE BOWEL SYNDROME 06/04/2007    Past Medical History:  Diagnosis Date  . Abnormal liver function   . Anemia   . Arthritis    back & knees   . Asthma    uses albuterol inhaler if outside, cold weather & exercising   . Depression   . Dyspnea   . Fatty liver   . GERD (gastroesophageal reflux disease)   . History of hiatal hernia   . History of kidney stones    passed spontaneously  . IBS (irritable bowel syndrome)   . Iron deficiency anemia   . Joint pain   . Lactose intolerance   . Leg edema   . Melanoma ([place])    malignant, left leg,  wide excision, sentinel node, one positive  stage 3, as a result- lymphdema, will be following with ONCOLOGY,  PET scan- last 2017  . Myasthenia gravis ([place])   . Myasthenia gravis ([place]) 2000  . Osteoarthritis   . Pneumonia 2015   hosp. [facility] , treated with IV antibiotics   . Recurrent upper respiratory infection (URI)   . Sleep apnea    sleep irregular pattern- no CPAP order solidified yet.   Marland Kitchen Spinal stenosis   . Vitamin B deficiency   . Vitamin D deficiency     Past Surgical History:  Procedure Laterality Date  . APPENDECTOMY    . BACK SURGERY    . BREAST SURGERY Bilateral 2004   reduction   . EYE SURGERY Bilateral 1999   corneal peel  . MELANOMA EXCISION Left    removed from left leg  . OOPHORECTOMY Left   . THYMECTOMY  2001  . TOTAL KNEE ARTHROPLASTY Left 01/2016  . WRIST SURGERY Left    plate & 6 screws     Social History   Tobacco Use  . Smoking status: Never Smoker  . Smokeless tobacco: Never Used  Substance Use Topics  . Alcohol use: Yes    Alcohol/week: 1.0 standard drinks    Types: 1 Standard drinks or equivalent per week    Comment: occassionally  . Drug use: No    Family History  Problem Relation Age of Onset  . COPD Mother   . Asthma Mother   . Allergic rhinitis Mother   . Lung cancer Father   . Diverticulitis Father   . Sleep apnea Father   . Asthma Father   . Allergic rhinitis Father   . Colitis Neg Hx   . Urticaria Neg Hx   . Eczema Neg Hx     Allergies  Allergen Reactions  . Other Other (See Comments)    Cant take some "mycin" drugs   . Tape Other (See Comments)    Blister, irritates skin    Medication list has been reviewed and updated.  Current Outpatient Medications on File Prior to Visit  Medication Sig Dispense Refill  . albuterol (PROVENTIL HFA;VENTOLIN HFA) 108 (90 Base) MCG/ACT inhaler Inhale 1 puff into the lungs every 6 (six) hours as needed for wheezing or shortness of breath. 1 Inhaler 6  . ATROVENT HFA 17 MCG/ACT inhaler     . buPROPion (WELLBUTRIN SR)  200 MG 12 hr tablet Take 1 tablet (200 mg total) by mouth daily. 30 tablet 0  . Carboxymethylcellulose Sodium (EYE DROPS OP) Apply 1 drop to eye daily as needed (dry eyes).    . Cholecalciferol (VITAMIN D3) 1.25 MG (50000 UT) CAPS TAKE 1 CAPSULE BY MOUTH ONE TIME PER WEEK 4 capsule 0  . EPINEPHrine 0.3 mg/0.3 mL IJ SOAJ injection     . hydrochlorothiazide (MICROZIDE) 12.5 MG capsule TAKE 1 CAPSULE (12.5 MG TOTAL) BY MOUTH DAILY. USE AS NEEDED FOR SWELLING 90 capsule 1  . HYDROcodone-acetaminophen (NORCO/VICODIN) 5-325 MG tablet Take 1 tablet by mouth every 6 (six) hours as needed for moderate pain. 90 tablet 0  . ipratropium (ATROVENT) 0.03 % nasal spray Place 2 sprays into both nostrils 4 (four) times daily. 90 mL 3  . ipratropium-albuterol (DUONEB) 0.5-2.5 (3) MG/3ML SOLN Take 3 mLs by nebulization every 4 (four) hours as needed (wheezing or shortness of breath).     . montelukast (SINGULAIR) 10 MG tablet Take 1 tablet (10 mg total) by mouth at bedtime. 30 tablet 5  . pramipexole (MIRAPEX) 1.5 MG tablet TAKE 2 TABLETS (3 MG TOTAL) BY MOUTH 2 (TWO) TIMES  DAILY AS NEEDED (FOR RESTLESS LEG SYNDROME 344 tablet 1  . tiZANidine (ZANAFLEX) 4 MG capsule Take 4 mg by mouth at bedtime.    . topiramate (TOPAMAX) 50 MG tablet Take 1 tablet (50 mg total) by mouth daily. 30 tablet 0  . Vitamin D, Ergocalciferol, (DRISDOL) 1.25 MG (50000 UT) CAPS capsule Take 1 capsule (50,000 Units total) by mouth every 7 (seven) days. 4 capsule 0  . XHANCE 93 MCG/ACT EXHU BLOW TWO DOSES IN EACH NOSTRIL TWICE DAILY AS DIRECTED. 2 mL 11  . metFORMIN (GLUCOPHAGE) 500 MG tablet Take 1 tablet (500 mg total) by mouth daily with breakfast. (Patient not taking: Reported on 08/02/2018) 30 tablet 0  . pantoprazole (PROTONIX) 40 MG tablet Take 1 tablet (40 mg total) by mouth daily for 30 days. 90 tablet 3   No current facility-administered medications on file prior to visit.     Review of Systems:  As per HPI- otherwise  negative.  No chest pain or shortness of breath  She also mentions today that she has noted trouble with concentration, difficulty tend to work tasks.  She wonders if she might have ADHD.  I advised her that unfortunately this issue would need an entire visit to discuss.  She also might consult with a psychologist who could evaluate her for ADHD Physical Examination: Vitals:   08/02/18 1353  BP: 118/80  Pulse: (!) 103  Resp: 16  Temp: 98.3 F (36.8 C)  SpO2: 96%   Vitals:   08/02/18 1353  Weight: 251 lb (113.9 kg)  Height: 5\' 7"  (1.702 m)   Body mass index is 39.31 kg/m. Ideal Body Weight: Weight in (lb) to have BMI = 25: 159.3  GEN: WDWN, NAD, Non-toxic, A & O x 3, obese, looks well  HEENT: Atraumatic, Normocephalic. Neck supple. No masses, No LAD. Ears and Nose: No external deformity. CV: RRR, No M/G/R. No JVD. No thrill. No extra heart sounds.  Mild tachycardia but normal rhythm, do not suspect A. fib at this time PULM: CTA B, no wheezes, crackles, rhonchi. No retractions. No resp. distress. No accessory muscle use. ABD: S, NT, ND, +BS. No rebound. No HSM. EXTR: No c/c/e NEURO Normal gait.  PSYCH: Normally interactive. Conversant. Not depressed or anxious appearing.  Calm demeanor.  She notes tenderness over the upper thoracic spine between the superior shoulder blades  I not palpate any swelling or mass.  Normal range of motion of both shoulders.   Assessment and Plan: Pain management - Plan: Pain Mgmt, Profile 8 w/Conf, U  Vitamin D deficiency - Plan: Vitamin D (25 hydroxy)  Lymphedema  MYASTHENIA GRAVIS WITHOUT EXACERBATION  Pernicious anemia - Plan: B12  Upper back pain - Plan: DG Thoracic Spine 2 View  [place] could not get her hydrocodone filled recently, I called her pharmacy and apparently she needs a prior authorization We will asked my staff to check on this for her Obtain UDS today Her controlled substance database is reviewed and looks  fine  History of vitamin B12 and vitamin D deficiencies, will check this for her today Myasthenia gravis, needs referral to neurology.  She will let me know who she would like to see, as she actually lives in [facility]  Mild tachycardia.  Patient notes this is been present since at least February, when she is admitted to the hospital.  She had an echocardiogram but was not referred to cardiology.  Again, I have asked her let me know who she would like  to be referred to and I am glad to place referral  Upper back pain-we will obtain x-rays today.  Would benefit from physical therapy and heat She is taking Zanaflex 4 mg at bedtime.  Advised that she may take 8 mg for short time if needed  Will plan further follow- up pending labs.  Signed Lamar Blinks, MD  Received her x-rays as follows- message to pt  Dg Thoracic Spine 2 View  Result Date: 08/02/2018 CLINICAL DATA:  58 year old female with 2 weeks of mid back pain, no known injury. Melanoma. EXAM: THORACIC SPINE 2 VIEWS COMPARISON:  PET-CT 05/23/2018 and earlier. FINDINGS: Prior median sternotomy. Chronic hiatal hernia redemonstrated. Stable visible thoracic visceral contours. Lower cervical disc and endplate degeneration. Cervicothoracic junction alignment is within normal limits. Normal thoracic segmentation. Stable thoracic vertebral height and alignment since 2019. Relatively preserved thoracic disc spaces with chronic endplate spurring. Bone mineralization is within normal limits. Posterior ribs appear intact. Partially visible lumbar fusion hardware. IMPRESSION: No acute osseous abnormality identified in the thoracic spine. Electronically Signed   By: Genevie Ann M.D.   On: 08/02/2018 15:22   Received her labs 6/6- message to pt Results for orders placed or performed in visit on 08/02/18  B12  Result Value Ref Range   Vitamin B-12 148 (L) 211 - 911 pg/mL  Vitamin D (25 hydroxy)  Result Value Ref Range   VITD 49.75 30.00 -  100.00 ng/mL

## 2018-08-02 ENCOUNTER — Ambulatory Visit: Payer: Managed Care, Other (non HMO) | Admitting: Family Medicine

## 2018-08-02 ENCOUNTER — Other Ambulatory Visit: Payer: Self-pay

## 2018-08-02 ENCOUNTER — Ambulatory Visit (HOSPITAL_BASED_OUTPATIENT_CLINIC_OR_DEPARTMENT_OTHER)
Admission: RE | Admit: 2018-08-02 | Discharge: 2018-08-02 | Disposition: A | Discharge status: Home / Self Care | Payer: Managed Care, Other (non HMO) | Source: Ambulatory Visit | Attending: Family Medicine | Admitting: Family Medicine

## 2018-08-02 ENCOUNTER — Encounter: Payer: Self-pay | Admitting: Family Medicine

## 2018-08-02 VITALS — BP 118/80 | HR 103 | Temp 98.3°F | Resp 16 | Ht 67.0 in | Wt 251.0 lb

## 2018-08-02 DIAGNOSIS — E559 Vitamin D deficiency, unspecified: Secondary | ICD-10-CM

## 2018-08-02 DIAGNOSIS — M549 Dorsalgia, unspecified: Secondary | ICD-10-CM | POA: Diagnosis not present

## 2018-08-02 DIAGNOSIS — D510 Vitamin B12 deficiency anemia due to intrinsic factor deficiency: Secondary | ICD-10-CM | POA: Diagnosis not present

## 2018-08-02 DIAGNOSIS — I890 Lymphedema, not elsewhere classified: Secondary | ICD-10-CM

## 2018-08-02 DIAGNOSIS — G7000 Myasthenia gravis without (acute) exacerbation: Secondary | ICD-10-CM | POA: Diagnosis not present

## 2018-08-02 DIAGNOSIS — R52 Pain, unspecified: Secondary | ICD-10-CM

## 2018-08-02 DIAGNOSIS — R000 Tachycardia, unspecified: Secondary | ICD-10-CM

## 2018-08-02 NOTE — Patient Instructions (Signed)
Let's get films of your thoracic spine today.  Assuming these are normal you likely have muscular pain in your back Ok to take a 2nd dose of zanaflex (up to 8 mg total) at bedtime for a short while if needed for pain Heat may also be helpful It would be good to have you see neurology for your [facility] and cardiology for your tachycardia- please find out who you would like to see in [place] and we are glad to refer you

## 2018-08-03 ENCOUNTER — Telehealth: Payer: Self-pay

## 2018-08-03 LAB — VITAMIN D 25 HYDROXY (VIT D DEFICIENCY, FRACTURES): VITD: 49.75 ng/mL (ref 30.00–100.00)

## 2018-08-03 LAB — VITAMIN B12: Vitamin B-12: 148 pg/mL — ABNORMAL LOW (ref 211–911)

## 2018-08-03 NOTE — Telephone Encounter (Signed)
PA initiated via Covermymeds; KEY: [id number]. PA approved.   LTEIHD:[id number];MMITVI:[id number];Review Type:Prior Auth;Coverage Start Date:08/03/2018;Coverage End Date:08/03/2019

## 2018-08-04 ENCOUNTER — Encounter: Payer: Self-pay | Admitting: Family Medicine

## 2018-08-04 LAB — PAIN MGMT, PROFILE 8 W/CONF, U
6 Acetylmorphine: NEGATIVE ng/mL
Alcohol Metabolites: POSITIVE ng/mL — AB (ref <500)
Amphetamines: NEGATIVE ng/mL
Benzodiazepines: NEGATIVE ng/mL
Buprenorphine, Urine: NEGATIVE ng/mL
Cocaine Metabolite: NEGATIVE ng/mL
Codeine: NEGATIVE ng/mL
Creatinine: 143.1 mg/dL
Ethyl Glucuronide (ETG): 1573 ng/mL
Ethyl Sulfate (ETS): 184 ng/mL
Hydrocodone: 348 ng/mL
Hydromorphone: 113 ng/mL
MDMA: NEGATIVE ng/mL
Marijuana Metabolite: NEGATIVE ng/mL
Morphine: NEGATIVE ng/mL
Norhydrocodone: 926 ng/mL
Opiates: POSITIVE ng/mL
Oxidant: NEGATIVE ug/mL
Oxycodone: NEGATIVE ng/mL
pH: 5.6 (ref 4.5–9.0)

## 2018-08-05 ENCOUNTER — Encounter: Payer: Self-pay | Admitting: Family Medicine

## 2018-09-04 ENCOUNTER — Other Ambulatory Visit: Payer: Self-pay | Admitting: Family Medicine

## 2018-09-04 DIAGNOSIS — R52 Pain, unspecified: Secondary | ICD-10-CM

## 2018-09-04 MED ORDER — HYDROCODONE-ACETAMINOPHEN 5-325 MG PO TABS: 1.0000 | ORAL_TABLET | Freq: Four times a day (QID) | ORAL | 0 refills | Status: DC | PRN

## 2018-09-04 NOTE — Telephone Encounter (Signed)
Pt [facility] a refill on [facility]codone. [facility] [place]

## 2018-10-09 ENCOUNTER — Telehealth: Payer: Self-pay | Admitting: Family Medicine

## 2018-10-09 DIAGNOSIS — R52 Pain, unspecified: Secondary | ICD-10-CM

## 2018-10-09 MED ORDER — HYDROCODONE-ACETAMINOPHEN 5-325 MG PO TABS: 1.0000 | ORAL_TABLET | Freq: Four times a day (QID) | ORAL | 0 refills | Status: DC | PRN

## 2018-10-09 MED ORDER — PANTOPRAZOLE SODIUM 40 MG PO TBEC: 40.0000 mg | DELAYED_RELEASE_TABLET | Freq: Every day | ORAL | 3 refills | Status: DC

## 2018-10-09 NOTE — Telephone Encounter (Signed)
Medication Refill - Medication: HYDROcodone-acetaminophen (NORCO/VICODIN) 5-325 MG tablet and pantoprazole (PROTONIX) 40 MG tablet(Expired)  Preferred Pharmacy (with phone number or street name): [id number] [address], [place] [phone] (Phone) [phone] (Fax)

## 2018-10-11 MED ORDER — HYDROCODONE-ACETAMINOPHEN 5-325 MG PO TABS: 1.0000 | ORAL_TABLET | Freq: Four times a day (QID) | ORAL | 0 refills | Status: DC | PRN

## 2018-10-11 MED ORDER — PANTOPRAZOLE SODIUM 40 MG PO TBEC: 40.0000 mg | DELAYED_RELEASE_TABLET | Freq: Every day | ORAL | 3 refills | Status: DC

## 2018-10-11 NOTE — Telephone Encounter (Signed)
Good Morning, the wrong pharmacy was noted on the message. Patient is requesting if the medication can be sent to the below pharmacy.  [id number] [address], [place]  [facility] [place] [address]  Phone: [phone] Fax: [phone]  Not a 24 hour pharmacy; exact hours not known.

## 2018-10-11 NOTE — Telephone Encounter (Signed)
Can you resend to correct pharmacy?

## 2018-10-11 NOTE — Telephone Encounter (Signed)
Pt called in and stated that [facility] [place] [facility] a date to fill [facility]/31.  She stated she never picked up the script from other pharmacy.  She stated that it needs to be resent with refill date of today on it.

## 2018-10-11 NOTE — Addendum Note (Signed)
Addended by: Lamar Blinks C on: 10/11/2018 12:20 PM   Modules accepted: Orders

## 2018-10-12 ENCOUNTER — Encounter: Payer: Self-pay | Admitting: Family Medicine

## 2018-10-13 ENCOUNTER — Encounter: Payer: Self-pay | Admitting: Hematology & Oncology

## 2018-10-13 ENCOUNTER — Encounter: Payer: Self-pay | Admitting: Family Medicine

## 2018-10-15 ENCOUNTER — Telehealth: Payer: Self-pay | Admitting: Hematology & Oncology

## 2018-10-15 NOTE — Telephone Encounter (Signed)
Called patient w/ date/time of appointment added per 8/17 patient advice

## 2018-10-17 ENCOUNTER — Inpatient Hospital Stay: Payer: Managed Care, Other (non HMO) | Attending: Family

## 2018-10-17 ENCOUNTER — Other Ambulatory Visit: Payer: Self-pay

## 2018-10-17 ENCOUNTER — Other Ambulatory Visit: Payer: Self-pay | Admitting: Family

## 2018-10-17 ENCOUNTER — Inpatient Hospital Stay: Payer: Managed Care, Other (non HMO) | Admitting: Family

## 2018-10-17 ENCOUNTER — Encounter: Payer: Self-pay | Admitting: Family

## 2018-10-17 VITALS — BP 156/82 | HR 101 | Temp 98.0°F | Resp 19 | Wt 262.4 lb

## 2018-10-17 DIAGNOSIS — D509 Iron deficiency anemia, unspecified: Secondary | ICD-10-CM | POA: Insufficient documentation

## 2018-10-17 DIAGNOSIS — Z79899 Other long term (current) drug therapy: Secondary | ICD-10-CM | POA: Insufficient documentation

## 2018-10-17 DIAGNOSIS — C4372 Malignant melanoma of left lower limb, including hip: Secondary | ICD-10-CM

## 2018-10-17 DIAGNOSIS — I890 Lymphedema, not elsewhere classified: Secondary | ICD-10-CM

## 2018-10-17 DIAGNOSIS — J4520 Mild intermittent asthma, uncomplicated: Secondary | ICD-10-CM

## 2018-10-17 DIAGNOSIS — G2581 Restless legs syndrome: Secondary | ICD-10-CM | POA: Diagnosis not present

## 2018-10-17 DIAGNOSIS — Z7984 Long term (current) use of oral hypoglycemic drugs: Secondary | ICD-10-CM | POA: Insufficient documentation

## 2018-10-17 DIAGNOSIS — D508 Other iron deficiency anemias: Secondary | ICD-10-CM

## 2018-10-17 DIAGNOSIS — C4362 Malignant melanoma of left upper limb, including shoulder: Secondary | ICD-10-CM | POA: Insufficient documentation

## 2018-10-17 LAB — CMP (CANCER CENTER ONLY)
ALT: 14 U/L (ref 0–44)
AST: 16 U/L (ref 15–41)
Albumin: 3.9 g/dL (ref 3.5–5.0)
Alkaline Phosphatase: 101 U/L (ref 38–126)
Anion gap: 9 (ref 5–15)
BUN: 16 mg/dL (ref 6–20)
CO2: 30 mmol/L (ref 22–32)
Calcium: 8.5 mg/dL — ABNORMAL LOW (ref 8.9–10.3)
Chloride: 103 mmol/L (ref 98–111)
Creatinine: 0.78 mg/dL (ref 0.44–1.00)
GFR, Est AFR Am: >60 mL/min (ref >60)
GFR, Estimated: >60 mL/min (ref >60)
Glucose, Bld: 103 mg/dL — ABNORMAL HIGH (ref 70–99)
Potassium: 3.6 mmol/L (ref 3.5–5.1)
Sodium: 142 mmol/L (ref 135–145)
Total Bilirubin: 0.3 mg/dL (ref 0.3–1.2)
Total Protein: 6.4 g/dL — ABNORMAL LOW (ref 6.5–8.1)

## 2018-10-17 LAB — CBC WITH DIFFERENTIAL (CANCER CENTER ONLY)
Abs Immature Granulocytes: 0.04 10*3/uL (ref 0.00–0.07)
Basophils Absolute: 0.1 10*3/uL (ref 0.0–0.1)
Basophils Relative: 1 %
Eosinophils Absolute: 0.2 10*3/uL (ref 0.0–0.5)
Eosinophils Relative: 4 %
HCT: 32.9 % — ABNORMAL LOW (ref 36.0–46.0)
Hemoglobin: 10.1 g/dL — ABNORMAL LOW (ref 12.0–15.0)
Immature Granulocytes: 1 %
Lymphocytes Relative: 30 %
Lymphs Abs: 1.8 10*3/uL (ref 0.7–4.0)
MCH: 26.9 pg (ref 26.0–34.0)
MCHC: 30.7 g/dL (ref 30.0–36.0)
MCV: 87.5 fL (ref 80.0–100.0)
Monocytes Absolute: 0.5 10*3/uL (ref 0.1–1.0)
Monocytes Relative: 8 %
Neutro Abs: 3.5 10*3/uL (ref 1.7–7.7)
Neutrophils Relative %: 56 %
Platelet Count: 308 10*3/uL (ref 150–400)
RBC: 3.76 MIL/uL — ABNORMAL LOW (ref 3.87–5.11)
RDW: 13.6 % (ref 11.5–15.5)
WBC Count: 6.1 10*3/uL (ref 4.0–10.5)
nRBC: 0 % (ref 0.0–0.2)

## 2018-10-17 LAB — IRON AND TIBC
Iron: 28 ug/dL — ABNORMAL LOW (ref 41–142)
Saturation Ratios: 7 % — ABNORMAL LOW (ref 21–57)
TIBC: 386 ug/dL (ref 236–444)
UIBC: 358 ug/dL (ref 120–384)

## 2018-10-17 LAB — FERRITIN: Ferritin: 18 ng/mL (ref 11–307)

## 2018-10-17 LAB — LACTATE DEHYDROGENASE: LDH: 188 U/L (ref 98–192)

## 2018-10-17 NOTE — Progress Notes (Signed)
Hematology and Oncology Follow Up Visit  [name] [id number] 1960/08/22 58 y.o. 10/17/2018   Principle Diagnosis:  Malignant melanoma of the left thigh, stage IIIb, one positive lymph node Melanoma of right calf and left upper arm treated with Mohs procedure  Iron deficiency anemia  Current Therapy:   Observation  IV iron as indicated   Interim History:  [name] is here today for follow-up. She is still having a hard time with lymphedema on the left side of her body. She is requesting a referral to a lymphedema clinic in [place].  She is feeling fatigued, SOB with any exertion, not sleeping well and has noticed that her restless leg syndrome is worse.  Hgb is 10.1, MCV 87.  Her PET scan in March showed no evidence of recurrent or metastatic disease.  She has not noted any blood loss, no bruising or petechiae.  No numbness or tingling in her extremities.  No fever, chills, n/v, cough, rash, dizziness, SOB, chest pain, palpitations, abdominal pain or changes in bowel or bladder habits.  She is eating well and hydrating. Her weight is up 10 lbs since June.   ECOG Performance Status: 1 - Symptomatic but completely ambulatory  Medications:  Allergies as of 10/17/2018      Reactions   Other Other (See Comments)   Cant take some "mycin" drugs    Tape Other (See Comments)   Blister, irritates skin      Medication List       Accurate as of October 17, 2018  9:30 AM. If you have any questions, ask your nurse or doctor.        albuterol 108 (90 Base) MCG/ACT inhaler Commonly known as: VENTOLIN HFA Inhale 1 puff into the lungs every 6 (six) hours as needed for wheezing or shortness of breath.   Atrovent HFA 17 MCG/ACT inhaler Generic drug: ipratropium   buPROPion 200 MG 12 hr tablet Commonly known as: Wellbutrin SR Take 1 tablet (200 mg total) by mouth daily.   EPINEPHrine 0.3 mg/0.3 mL Soaj injection Commonly known as: EPI-PEN   EYE DROPS OP Apply 1 drop  to eye daily as needed (dry eyes).   hydrochlorothiazide 12.5 MG capsule Commonly known as: MICROZIDE TAKE 1 CAPSULE (12.5 MG TOTAL) BY MOUTH DAILY. USE AS NEEDED FOR SWELLING   HYDROcodone-acetaminophen 5-325 MG tablet Commonly known as: NORCO/VICODIN Take 1 tablet by mouth every 6 (six) hours as needed for moderate pain.   ipratropium 0.03 % nasal spray Commonly known as: ATROVENT Place 2 sprays into both nostrils 4 (four) times daily.   ipratropium-albuterol 0.5-2.5 (3) MG/3ML Soln Commonly known as: DUONEB Take 3 mLs by nebulization every 4 (four) hours as needed (wheezing or shortness of breath).   metFORMIN 500 MG tablet Commonly known as: GLUCOPHAGE Take 1 tablet (500 mg total) by mouth daily with breakfast.   montelukast 10 MG tablet Commonly known as: SINGULAIR Take 1 tablet (10 mg total) by mouth at bedtime.   pantoprazole 40 MG tablet Commonly known as: PROTONIX Take 1 tablet (40 mg total) by mouth daily.   pramipexole 1.5 MG tablet Commonly known as: MIRAPEX TAKE 2 TABLETS (3 MG TOTAL) BY MOUTH 2 (TWO) TIMES DAILY AS NEEDED (FOR RESTLESS LEG SYNDROME   tiZANidine 4 MG capsule Commonly known as: ZANAFLEX Take 4 mg by mouth at bedtime.   topiramate 50 MG tablet Commonly known as: Topamax Take 1 tablet (50 mg total) by mouth daily.   Vitamin D (Ergocalciferol) 1.25 MG (50000  UT) Caps capsule Commonly known as: DRISDOL Take 1 capsule (50,000 Units total) by mouth every 7 (seven) days.   Vitamin D3 1.25 MG (50000 UT) Caps TAKE 1 CAPSULE BY MOUTH ONE TIME PER WEEK   Xhance 93 MCG/ACT Exhu Generic drug: Fluticasone Propionate BLOW TWO DOSES IN EACH NOSTRIL TWICE DAILY AS DIRECTED.       Allergies:  Allergies  Allergen Reactions  . Other Other (See Comments)    Cant take some "mycin" drugs   . Tape Other (See Comments)    Blister, irritates skin    Past Medical History, Surgical history, Social history, and Family History were reviewed and updated.   Review of Systems: All other 10 point review of systems is negative.   Physical Exam:  vitals were not taken for this visit.   Wt Readings from Last 3 Encounters:  08/02/18 251 lb (113.9 kg)  04/23/18 238 lb (108 kg)  04/17/18 241 lb 6.5 oz (109.5 kg)    Ocular: Sclerae unicteric, pupils equal, round and reactive to light Ear-nose-throat: Oropharynx clear, dentition fair Lymphatic: No axillary, cervical or supraclavicular adenopathy, lymphedema present on left side of body including neck, arm and leg  Lungs no rales or rhonchi, good excursion bilaterally Heart regular rate and rhythm, no murmur appreciated Abd soft, nontender, positive bowel sounds, no liber or spleen tip palpated on exam, no fluid wave  MSK no focal spinal tenderness, no joint edema Neuro: non-focal, well-oriented, appropriate affect Breasts: Deferred   Lab Results  Component Value Date   WBC 6.1 10/17/2018   HGB 10.1 (L) 10/17/2018   HCT 32.9 (L) 10/17/2018   MCV 87.5 10/17/2018   PLT 308 10/17/2018   Lab Results  Component Value Date   FERRITIN 140 04/23/2018   IRON 150 (H) 04/23/2018   TIBC 274 04/23/2018   UIBC 124 04/23/2018   IRONPCTSAT 55 04/23/2018   Lab Results  Component Value Date   RBC 3.76 (L) 10/17/2018   Lab Results  Component Value Date   KPAFRELGTCHN 15.6 04/23/2018   LAMBDASER 11.0 04/23/2018   KAPLAMBRATIO 1.42 04/23/2018   Lab Results  Component Value Date   IGGSERUM 836 04/23/2018   IGA 459 (H) 04/23/2018   IGMSERUM 103 04/23/2018   Lab Results  Component Value Date   TOTALPROTELP 6.6 04/23/2018   ALBUMINELP 3.7 04/23/2018   A1GS 0.2 04/23/2018   A2GS 0.8 04/23/2018   BETS 1.3 04/23/2018   BETA2SER 0.6 (H) 12/18/2017   GAMS 0.7 04/23/2018   MSPIKE Not Observed 04/23/2018   SPEI Comment 04/23/2018     Chemistry      Component Value Date/Time   NA 142 10/17/2018 0848   NA 140 10/24/2017 0935   K 3.6 10/17/2018 0848   CL 103 10/17/2018 0848   CO2 30  10/17/2018 0848   BUN 16 10/17/2018 0848   BUN 17 10/24/2017 0935   CREATININE 0.78 10/17/2018 0848      Component Value Date/Time   CALCIUM 8.5 (L) 10/17/2018 0848   ALKPHOS 101 10/17/2018 0848   AST 16 10/17/2018 0848   ALT 14 10/17/2018 0848   BILITOT 0.3 10/17/2018 0848       Impression and Plan: [name] is a very pleasant 58 yo caucasian female with history of malignant melnome stage III of the left posterior thigh, one positive lymph node diagnosed in September 2015. She also has history of melanoma of the right calf and left upper arm treated with Moh's surgery. No chemotherapy.  Referral placed for lymphedema clinic with request for the [facility] area.  We will see what her iron studies show and bring her back in for infusion if needed.  We will repeat PET scan if she shows any symptoms of recurrence. She continues to follow-up regularly with dermatology.  We will go ahead and plan to see her back in another 6 months.  She will contact our office with any questions or concerns. We can certainly see her sooner if needed.   Laverna Peace, NP 8/19/20209:30 AM

## 2018-10-22 ENCOUNTER — Other Ambulatory Visit: Payer: BLUE CROSS/BLUE SHIELD

## 2018-10-22 ENCOUNTER — Ambulatory Visit: Payer: BLUE CROSS/BLUE SHIELD | Admitting: Hematology & Oncology

## 2018-10-23 ENCOUNTER — Encounter: Payer: Self-pay | Admitting: Family

## 2018-10-23 ENCOUNTER — Telehealth: Payer: Self-pay | Admitting: *Deleted

## 2018-10-23 ENCOUNTER — Other Ambulatory Visit: Payer: Self-pay | Admitting: *Deleted

## 2018-10-23 NOTE — Telephone Encounter (Signed)
Message received from [place] at [facility] requesting a printed order with a signature for pt.'s OT referral.  Referral printed, signed by S. Thatcher NP and faxed to [phone] per Jordan's request.

## 2018-10-24 ENCOUNTER — Other Ambulatory Visit: Payer: Self-pay | Admitting: Family

## 2018-10-24 DIAGNOSIS — C4372 Malignant melanoma of left lower limb, including hip: Secondary | ICD-10-CM

## 2018-10-24 DIAGNOSIS — R0602 Shortness of breath: Secondary | ICD-10-CM

## 2018-10-26 ENCOUNTER — Ambulatory Visit (HOSPITAL_BASED_OUTPATIENT_CLINIC_OR_DEPARTMENT_OTHER)
Admission: RE | Admit: 2018-10-26 | Discharge: 2018-10-26 | Disposition: A | Discharge status: Home / Self Care | Payer: Managed Care, Other (non HMO) | Source: Ambulatory Visit | Attending: Family | Admitting: Family

## 2018-10-26 ENCOUNTER — Telehealth: Payer: Self-pay | Admitting: *Deleted

## 2018-10-26 ENCOUNTER — Inpatient Hospital Stay: Payer: Managed Care, Other (non HMO)

## 2018-10-26 ENCOUNTER — Other Ambulatory Visit: Payer: Self-pay

## 2018-10-26 VITALS — BP 119/66 | HR 80 | Temp 98.6°F | Resp 18

## 2018-10-26 DIAGNOSIS — R0602 Shortness of breath: Secondary | ICD-10-CM | POA: Diagnosis present

## 2018-10-26 DIAGNOSIS — C4372 Malignant melanoma of left lower limb, including hip: Secondary | ICD-10-CM | POA: Insufficient documentation

## 2018-10-26 DIAGNOSIS — D508 Other iron deficiency anemias: Secondary | ICD-10-CM

## 2018-10-26 MED ORDER — IOHEXOL 350 MG/ML SOLN
100.0000 mL | Freq: Once | INTRAVENOUS | Status: AC | PRN
  Administered 2018-10-26: 11:00:00 67 mL via INTRAVENOUS

## 2018-10-26 MED ORDER — SODIUM CHLORIDE 0.9 % IV SOLN
510.0000 mg | Freq: Once | INTRAVENOUS | Status: AC
  Administered 2018-10-26: 510 mg via INTRAVENOUS
  Filled 2018-10-26: qty 510

## 2018-10-26 MED ORDER — IOHEXOL 300 MG/ML  SOLN: 100.0000 mL | Freq: Once | INTRAMUSCULAR | Status: DC | PRN

## 2018-10-26 MED ORDER — SODIUM CHLORIDE 0.9 % IV SOLN
INTRAVENOUS | Status: DC
  Administered 2018-10-26: 12:00:00 via INTRAVENOUS
  Filled 2018-10-26: qty 250

## 2018-10-26 NOTE — Telephone Encounter (Signed)
-----   Message from Eliezer Bottom, NP sent at 10/26/2018  2:21 PM EDT ----- No PE!!!!! [facility]!!!!! Hopefully her symptoms will resolve since her iron is being replaced.    Sarah ----- Message ----- From: Buel Ream, Rad Results In Sent: 10/26/2018  11:37 AM EDT To: Eliezer Bottom, NP

## 2018-10-26 NOTE — Telephone Encounter (Signed)
Call received back from patient.  Patient notified per order of S. Butters NP that there is "no PE and that hopefully your symptoms will resolve since your iron is being replaced."  Pt appreciative of information and has no questions or concerns at this time.

## 2018-10-26 NOTE — Patient Instructions (Signed)

## 2018-11-02 ENCOUNTER — Other Ambulatory Visit: Payer: Self-pay

## 2018-11-02 ENCOUNTER — Inpatient Hospital Stay: Payer: Managed Care, Other (non HMO) | Attending: Hematology & Oncology

## 2018-11-02 VITALS — BP 132/78 | HR 72 | Temp 97.5°F | Resp 16

## 2018-11-02 DIAGNOSIS — D509 Iron deficiency anemia, unspecified: Secondary | ICD-10-CM | POA: Insufficient documentation

## 2018-11-02 DIAGNOSIS — D508 Other iron deficiency anemias: Secondary | ICD-10-CM

## 2018-11-02 MED ORDER — SODIUM CHLORIDE 0.9 % IV SOLN
510.0000 mg | Freq: Once | INTRAVENOUS | Status: AC
  Administered 2018-11-02: 14:00:00 510 mg via INTRAVENOUS
  Filled 2018-11-02: qty 17

## 2018-11-02 MED ORDER — SODIUM CHLORIDE 0.9 % IV SOLN
INTRAVENOUS | Status: DC
  Administered 2018-11-02: 13:00:00 via INTRAVENOUS
  Filled 2018-11-02: qty 250

## 2018-11-02 MED ORDER — SODIUM CHLORIDE 0.9% FLUSH
10.0000 mL | INTRAVENOUS | Status: DC | PRN
  Filled 2018-11-02: qty 10

## 2018-11-02 MED ORDER — SODIUM CHLORIDE 0.9% FLUSH
3.0000 mL | Freq: Once | INTRAVENOUS | Status: DC | PRN
  Filled 2018-11-02: qty 10

## 2018-11-08 ENCOUNTER — Telehealth: Payer: Self-pay | Admitting: Hematology & Oncology

## 2018-11-08 NOTE — Telephone Encounter (Signed)
Called and LMVM for her to call me back regarding OT referral as requested by Tildon Husky on 9/4

## 2018-11-23 ENCOUNTER — Telehealth: Payer: Self-pay | Admitting: Family Medicine

## 2018-11-23 DIAGNOSIS — R52 Pain, unspecified: Secondary | ICD-10-CM

## 2018-11-23 MED ORDER — HYDROCODONE-ACETAMINOPHEN 5-325 MG PO TABS: 1.0000 | ORAL_TABLET | Freq: Four times a day (QID) | ORAL | 0 refills | Status: AC | PRN

## 2018-11-23 NOTE — Telephone Encounter (Signed)
Last written: 10/11/18 Last ov: 08/02/18 Next ov: none Contract: not updated UDS:  08/02/18

## 2018-11-23 NOTE — Telephone Encounter (Signed)
Pt said pharm told her to call her doctor. [facility] [place]  on [address]

## 2019-04-18 ENCOUNTER — Other Ambulatory Visit: Payer: Managed Care, Other (non HMO)

## 2019-04-18 ENCOUNTER — Ambulatory Visit: Payer: Managed Care, Other (non HMO) | Admitting: Hematology & Oncology

## 2019-04-23 ENCOUNTER — Other Ambulatory Visit: Payer: Self-pay | Admitting: Family Medicine

## 2019-04-23 DIAGNOSIS — G2581 Restless legs syndrome: Secondary | ICD-10-CM

## 2019-05-31 ENCOUNTER — Ambulatory Visit: Payer: Managed Care, Other (non HMO) | Attending: Internal Medicine

## 2019-05-31 DIAGNOSIS — Z23 Encounter for immunization: Secondary | ICD-10-CM

## 2019-05-31 NOTE — Progress Notes (Signed)
   Covid-19 Vaccination Clinic  Name:  [name]    MRN: [id number] DOB: 1960-08-26  05/31/2019  [name] was observed post Covid-19 immunization for 15 minutes without incident. She was provided with Vaccine Information Sheet and instruction to access the V-Safe system.   [name] was instructed to call 911 with any severe reactions post vaccine: Marland Kitchen Difficulty breathing  . Swelling of face and throat  . A fast heartbeat  . A bad rash all over body  . Dizziness and weakness   Immunizations Administered    Name Date Dose VIS Date Route   Pfizer COVID-19 Vaccine 05/31/2019  9:28 AM 0.3 mL 02/08/2019 Intramuscular   Manufacturer: [employer]   Lot: [id number]   [place]: [id number]

## 2019-06-26 ENCOUNTER — Ambulatory Visit: Payer: Managed Care, Other (non HMO)

## 2019-08-23 ENCOUNTER — Other Ambulatory Visit: Payer: Self-pay | Admitting: Radiology

## 2019-08-23 DIAGNOSIS — N644 Mastodynia: Secondary | ICD-10-CM

## 2019-08-30 ENCOUNTER — Ambulatory Visit
Admission: RE | Admit: 2019-08-30 | Discharge: 2019-08-30 | Disposition: A | Discharge status: Home / Self Care | Payer: Managed Care, Other (non HMO) | Source: Ambulatory Visit | Attending: Radiology | Admitting: Radiology

## 2019-08-30 ENCOUNTER — Other Ambulatory Visit: Payer: Self-pay

## 2019-08-30 DIAGNOSIS — N644 Mastodynia: Secondary | ICD-10-CM

## 2019-12-04 ENCOUNTER — Other Ambulatory Visit: Payer: Self-pay | Admitting: Family Medicine

## 2020-02-16 NOTE — H&P (Signed)
Patient name  [name], [place] DICTATION#[id number] CSN# [id number]  Arvella Nigh, MD 02/16/2020 11:14 AM

## 2020-02-18 NOTE — H&P (Signed)
NAMEJASMAN, [name] MEDICAL RECORD [id number] ACCOUNT [id number] DATE OF BIRTH:01-03-1961 FACILITY: [facility] LOCATION:  [place], [place]  HISTORY AND PHYSICAL  DATE OF ADMISSION:  02/26/2020  DATE OF SURGERY:  12/29 at [facility] outpatient area.  HISTORY OF PRESENT ILLNESS:  The patient is a 59 year old gravida 3, para 2, postmenopausal patient presents for hysteroscopy.  She had postmenopausal bleeding.  She underwent an evaluation and had an endocervical polyp that was removed.  Subsequent  saline infusion ultrasound did reveal endometrial polyps.  We attempted an in-office hysteroscopic evaluation.  The patient could not relax due to dropping O2 sats.  During this time, we had a fundal perforation.  We now bring her into the hospital for  the hysteroscopic evaluation, resection of the polyps.  ALLERGIES:  She has no known drug allergies.  MEDICATIONS:  She is on the Advair Diskus ____.  Albuterol inhaler.  Epinephrine auto-injector.  Trazodone as needed.  PAST MEDICAL HISTORY:  She has usual childhood diseases without any significant sequelae.  PAST SURGICAL HISTORY:  She has had leg surgery.  PROCEDURE:  On the elbow total replacement, left knee and wide excision from the left knee.  She does have a history of melanoma.  SOCIAL HISTORY:  No tobacco or alcohol use.  FAMILY HISTORY:  Noncontributory.  PHYSICAL EXAMINATION: VITAL SIGNS:  The patient is afebrile with stable vital signs. HEENT:  The patient is normocephalic.  Pupils equal, round, reactive to light and accommodation.  Extraocular movements were intact.  Sclerae and conjunctivae clear.  Oropharynx clear. NECK:  No thyromegaly. LUNGS:  Clear to auscultation and percussion. CARDIOVASCULAR:  Regular rhythm and rate without murmurs or gallops. ABDOMEN:  Soft.  No masses, organomegaly or tenderness. PELVIC:  Normal external genitalia.  Vaginal mucosa clear.  Cervix unremarkable.  Uterus normal  size, shape and contour.  Adnexa free of mass or tenderness. NEUROLOGIC:  Grossly normal limits.  IMPRESSION:  Postmenopausal bleeding with endometrial polyp.  PLAN:  The patient to undergo hysteroscopy in the hospital with resection of endometrial polyps.  The nature of the procedure have been discussed.  The risks have been explained including risk of infection.  The risk of hemorrhage that could require  transfusion with the risk of AIDS or hepatitis.  Excessive bleeding could require hysterectomy.  There is a risk of injury to adjacent organs including bladder, bowel, ureters that could require further exploratory surgery.  Risk of deep venous  thrombosis and pulmonary embolus.  The patient presents understanding of indications and risks.  HN/NUANCE  D:02/16/2020 T:02/16/2020 JOB:[id number]

## 2020-02-24 ENCOUNTER — Encounter (HOSPITAL_BASED_OUTPATIENT_CLINIC_OR_DEPARTMENT_OTHER): Payer: Self-pay | Admitting: Obstetrics and Gynecology

## 2020-02-24 ENCOUNTER — Other Ambulatory Visit: Payer: Self-pay

## 2020-02-24 NOTE — Progress Notes (Addendum)
Spoke w/ via phone for pre-op interview--- PT Lab needs dos----  no             Lab results------ pt getting cbc, cmp, t&s 02-25-2020 @ 1100 COVID test ------ 02-25-2020 @1150  Arrive at ------- 0530 NPO after MN  Medications to take morning of surgery ----- Protonix Diabetic medication ----- n/a Patient Special Instructions ----- n/a Pre-Op special Istructions ----- n/a Patient verbalized understanding of instructions that were given at this phone interview. Patient denies shortness of breath, chest pain, fever, cough at this phone interview.   Anesthesia :    MG per pt in remission since 2018;  Hx malignant melanoma 2015 s/p wle left knee w/ lnd no chemoradiation and 03/ 2021 right hip excision no lnd or orther intervention;  OSA does not uses cpap due to allergies is in process of getting new mask;  Asthma, stated last used duoneb/ rescue inhaler several months ago.    PCP:  Dr 2022. Copland Cardiologist : no Neurologist:  Previous neurology in [place] in process in changing to Dr. Shela Commons, whom is following her for OSA Chest x-ray : no EKG : 10-16-2019  [facility] in Care everywhere (NSA rate 83) Echo : no Stress test: no Cardiac Cath :  no Activity level:  Denies sob w/ any acitivy Sleep Study/ CPAP :  YES/ NO Fasting Blood Sugar :      / Checks Blood Sugar -- times a day:   N/a Blood Thinner/ Instructions /Last Dose: NO ASA / Instructions/ Last Dose :  NO

## 2020-02-25 ENCOUNTER — Other Ambulatory Visit (HOSPITAL_COMMUNITY)
Admission: RE | Admit: 2020-02-25 | Discharge: 2020-02-25 | Disposition: A | Discharge status: Home / Self Care | Payer: Managed Care, Other (non HMO) | Source: Ambulatory Visit | Attending: Obstetrics and Gynecology | Admitting: Obstetrics and Gynecology

## 2020-02-25 ENCOUNTER — Other Ambulatory Visit (HOSPITAL_COMMUNITY): Payer: Managed Care, Other (non HMO)

## 2020-02-25 ENCOUNTER — Encounter (HOSPITAL_COMMUNITY)
Admission: RE | Admit: 2020-02-25 | Discharge: 2020-02-25 | Disposition: A | Discharge status: Home / Self Care | Payer: Managed Care, Other (non HMO) | Source: Ambulatory Visit | Attending: Obstetrics and Gynecology | Admitting: Obstetrics and Gynecology

## 2020-02-25 DIAGNOSIS — Z20822 Contact with and (suspected) exposure to covid-19: Secondary | ICD-10-CM | POA: Insufficient documentation

## 2020-02-25 DIAGNOSIS — Z01812 Encounter for preprocedural laboratory examination: Secondary | ICD-10-CM | POA: Diagnosis present

## 2020-02-25 LAB — COMPREHENSIVE METABOLIC PANEL
ALT: 17 U/L (ref 0–44)
AST: 15 U/L (ref 15–41)
Albumin: 3.8 g/dL (ref 3.5–5.0)
Alkaline Phosphatase: 102 U/L (ref 38–126)
Anion gap: 8 (ref 5–15)
BUN: 18 mg/dL (ref 6–20)
CO2: 30 mmol/L (ref 22–32)
Calcium: 8.9 mg/dL (ref 8.9–10.3)
Chloride: 102 mmol/L (ref 98–111)
Creatinine, Ser: 0.63 mg/dL (ref 0.44–1.00)
GFR, Estimated: >60 mL/min (ref >60)
Glucose, Bld: 96 mg/dL (ref 70–99)
Potassium: 4.1 mmol/L (ref 3.5–5.1)
Sodium: 140 mmol/L (ref 135–145)
Total Bilirubin: 0.4 mg/dL (ref 0.3–1.2)
Total Protein: 6.8 g/dL (ref 6.5–8.1)

## 2020-02-25 LAB — CBC
HCT: 34.2 % — ABNORMAL LOW (ref 36.0–46.0)
Hemoglobin: 10.7 g/dL — ABNORMAL LOW (ref 12.0–15.0)
MCH: 27.9 pg (ref 26.0–34.0)
MCHC: 31.3 g/dL (ref 30.0–36.0)
MCV: 89.1 fL (ref 80.0–100.0)
Platelets: 241 10*3/uL (ref 150–400)
RBC: 3.84 MIL/uL — ABNORMAL LOW (ref 3.87–5.11)
RDW: 18.9 % — ABNORMAL HIGH (ref 11.5–15.5)
WBC: 6.1 10*3/uL (ref 4.0–10.5)
nRBC: 0 % (ref 0.0–0.2)

## 2020-02-26 ENCOUNTER — Encounter (HOSPITAL_BASED_OUTPATIENT_CLINIC_OR_DEPARTMENT_OTHER)
Admission: RE | Disposition: A | Discharge status: Home / Self Care | Payer: Self-pay | Source: Home / Self Care | Attending: Obstetrics and Gynecology

## 2020-02-26 ENCOUNTER — Other Ambulatory Visit: Payer: Self-pay

## 2020-02-26 ENCOUNTER — Ambulatory Visit (HOSPITAL_BASED_OUTPATIENT_CLINIC_OR_DEPARTMENT_OTHER): Payer: Managed Care, Other (non HMO) | Admitting: Anesthesiology

## 2020-02-26 ENCOUNTER — Encounter (HOSPITAL_BASED_OUTPATIENT_CLINIC_OR_DEPARTMENT_OTHER): Payer: Self-pay | Admitting: Obstetrics and Gynecology

## 2020-02-26 ENCOUNTER — Ambulatory Visit (HOSPITAL_BASED_OUTPATIENT_CLINIC_OR_DEPARTMENT_OTHER)
Admission: RE | Admit: 2020-02-26 | Discharge: 2020-02-26 | Disposition: A | Discharge status: Home / Self Care | Payer: Managed Care, Other (non HMO) | Attending: Obstetrics and Gynecology | Admitting: Obstetrics and Gynecology

## 2020-02-26 DIAGNOSIS — J45909 Unspecified asthma, uncomplicated: Secondary | ICD-10-CM | POA: Insufficient documentation

## 2020-02-26 DIAGNOSIS — Z7951 Long term (current) use of inhaled steroids: Secondary | ICD-10-CM | POA: Diagnosis not present

## 2020-02-26 DIAGNOSIS — Z79899 Other long term (current) drug therapy: Secondary | ICD-10-CM | POA: Diagnosis not present

## 2020-02-26 DIAGNOSIS — N950 Postmenopausal bleeding: Secondary | ICD-10-CM | POA: Diagnosis not present

## 2020-02-26 DIAGNOSIS — N840 Polyp of corpus uteri: Secondary | ICD-10-CM | POA: Insufficient documentation

## 2020-02-26 HISTORY — DX: Obstructive sleep apnea (adult) (pediatric): G47.33

## 2020-02-26 HISTORY — DX: Diaphragmatic hernia without obstruction or gangrene: K44.9

## 2020-02-26 HISTORY — DX: Unspecified asthma, uncomplicated: J45.909

## 2020-02-26 HISTORY — DX: Polyp of corpus uteri: N84.0

## 2020-02-26 HISTORY — DX: Lymphedema, not elsewhere classified: I89.0

## 2020-02-26 HISTORY — PX: DILATATION & CURETTAGE/HYSTEROSCOPY WITH MYOSURE: SHX6511

## 2020-02-26 LAB — TYPE AND SCREEN
ABO/RH(D): O NEG
Antibody Screen: NEGATIVE

## 2020-02-26 LAB — SARS CORONAVIRUS 2 (TAT 6-24 HRS): SARS Coronavirus 2: NEGATIVE

## 2020-02-26 SURGERY — DILATATION & CURETTAGE/HYSTEROSCOPY WITH MYOSURE
Anesthesia: General | Site: Vagina

## 2020-02-26 MED ORDER — LIDOCAINE HCL (PF) 2 % IJ SOLN
INTRAMUSCULAR | Status: AC
  Filled 2020-02-26: qty 5

## 2020-02-26 MED ORDER — FENTANYL CITRATE (PF) 100 MCG/2ML IJ SOLN
INTRAMUSCULAR | Status: AC
  Filled 2020-02-26: qty 2

## 2020-02-26 MED ORDER — LACTATED RINGERS IV SOLN: INTRAVENOUS | Status: DC

## 2020-02-26 MED ORDER — SODIUM CHLORIDE 0.9 % IR SOLN
Status: DC | PRN
  Administered 2020-02-26: 3000 mL

## 2020-02-26 MED ORDER — LIDOCAINE HCL (CARDIAC) PF 100 MG/5ML IV SOSY
PREFILLED_SYRINGE | INTRAVENOUS | Status: DC | PRN
  Administered 2020-02-26: 100 mg via INTRAVENOUS

## 2020-02-26 MED ORDER — POVIDONE-IODINE 10 % EX SWAB: 2.0000 "application " | Freq: Once | CUTANEOUS | Status: DC

## 2020-02-26 MED ORDER — DEXAMETHASONE SODIUM PHOSPHATE 10 MG/ML IJ SOLN
INTRAMUSCULAR | Status: AC
  Filled 2020-02-26: qty 1

## 2020-02-26 MED ORDER — PROMETHAZINE HCL 25 MG/ML IJ SOLN: 6.2500 mg | INTRAMUSCULAR | Status: DC | PRN

## 2020-02-26 MED ORDER — FENTANYL CITRATE (PF) 100 MCG/2ML IJ SOLN: 25.0000 ug | INTRAMUSCULAR | Status: DC | PRN

## 2020-02-26 MED ORDER — KETOROLAC TROMETHAMINE 30 MG/ML IJ SOLN
30.0000 mg | Freq: Once | INTRAMUSCULAR | Status: AC | PRN
  Administered 2020-02-26: 30 mg via INTRAVENOUS

## 2020-02-26 MED ORDER — PROPOFOL 10 MG/ML IV BOLUS
INTRAVENOUS | Status: AC
  Filled 2020-02-26: qty 40

## 2020-02-26 MED ORDER — ONDANSETRON HCL 4 MG/2ML IJ SOLN
INTRAMUSCULAR | Status: AC
  Filled 2020-02-26: qty 2

## 2020-02-26 MED ORDER — MIDAZOLAM HCL 5 MG/5ML IJ SOLN
INTRAMUSCULAR | Status: DC | PRN
  Administered 2020-02-26: 2 mg via INTRAVENOUS

## 2020-02-26 MED ORDER — FENTANYL CITRATE (PF) 100 MCG/2ML IJ SOLN
INTRAMUSCULAR | Status: DC | PRN
  Administered 2020-02-26 (×3): 50 ug via INTRAVENOUS

## 2020-02-26 MED ORDER — KETOROLAC TROMETHAMINE 30 MG/ML IJ SOLN
INTRAMUSCULAR | Status: AC
  Filled 2020-02-26: qty 1

## 2020-02-26 MED ORDER — CEFAZOLIN SODIUM-DEXTROSE 2-4 GM/100ML-% IV SOLN
INTRAVENOUS | Status: AC
  Filled 2020-02-26: qty 100

## 2020-02-26 MED ORDER — OXYCODONE HCL 5 MG/5ML PO SOLN: 5.0000 mg | Freq: Once | ORAL | Status: AC | PRN

## 2020-02-26 MED ORDER — OXYCODONE HCL 5 MG PO TABS
ORAL_TABLET | ORAL | Status: AC
  Filled 2020-02-26: qty 1

## 2020-02-26 MED ORDER — PROPOFOL 10 MG/ML IV BOLUS
INTRAVENOUS | Status: DC | PRN
  Administered 2020-02-26: 100 mg via INTRAVENOUS
  Administered 2020-02-26: 200 mg via INTRAVENOUS

## 2020-02-26 MED ORDER — OXYCODONE HCL 5 MG PO TABS
5.0000 mg | ORAL_TABLET | Freq: Once | ORAL | Status: AC | PRN
  Administered 2020-02-26: 5 mg via ORAL

## 2020-02-26 MED ORDER — CEFAZOLIN SODIUM-DEXTROSE 2-4 GM/100ML-% IV SOLN
2.0000 g | INTRAVENOUS | Status: AC
  Administered 2020-02-26: 2 g via INTRAVENOUS

## 2020-02-26 MED ORDER — MIDAZOLAM HCL 2 MG/2ML IJ SOLN
INTRAMUSCULAR | Status: AC
  Filled 2020-02-26: qty 2

## 2020-02-26 MED ORDER — ONDANSETRON HCL 4 MG/2ML IJ SOLN
INTRAMUSCULAR | Status: DC | PRN
  Administered 2020-02-26: 4 mg via INTRAVENOUS

## 2020-02-26 MED ORDER — DEXAMETHASONE SODIUM PHOSPHATE 4 MG/ML IJ SOLN
INTRAMUSCULAR | Status: DC | PRN
  Administered 2020-02-26: 10 mg via INTRAVENOUS

## 2020-02-26 MED ORDER — KETOROLAC TROMETHAMINE 30 MG/ML IJ SOLN
INTRAMUSCULAR | Status: DC | PRN
  Administered 2020-02-26: 30 mg via INTRAVENOUS

## 2020-02-26 SURGICAL SUPPLY — 20 items
BIPOLAR CUTTING LOOP 21FR (ELECTRODE)
CANISTER SUCT 3000ML PPV (MISCELLANEOUS) ×3 IMPLANT
CATH ROBINSON RED A/P 16FR (CATHETERS) ×3 IMPLANT
COVER WAND RF STERILE (DRAPES) ×3 IMPLANT
DEVICE MYOSURE REACH (MISCELLANEOUS) ×1 IMPLANT
DILATOR CANAL MILEX (MISCELLANEOUS) IMPLANT
GAUZE 4X4 16PLY RFD (DISPOSABLE) ×3 IMPLANT
GLOVE BIO SURGEON STRL SZ7 (GLOVE) ×6 IMPLANT
GOWN STRL REUS W/TWL XL LVL3 (GOWN DISPOSABLE) ×3 IMPLANT
IV NS IRRIG 3000ML ARTHROMATIC (IV SOLUTION) ×3 IMPLANT
KIT PROCEDURE FLUENT (KITS) ×3 IMPLANT
KIT TURNOVER CYSTO (KITS) ×3 IMPLANT
LOOP CUTTING BIPOLAR 21FR (ELECTRODE) IMPLANT
NDL SPNL 18GX3.5 QUINCKE PK (NEEDLE) ×2 IMPLANT
NEEDLE SPNL 18GX3.5 QUINCKE PK (NEEDLE) ×2 IMPLANT
PACK VAGINAL MINOR WOMEN LF (CUSTOM PROCEDURE TRAY) ×3 IMPLANT
PAD OB MATERNITY 4.3X12.25 (PERSONAL CARE ITEMS) ×3 IMPLANT
PAD PREP 24X48 CUFFED NSTRL (MISCELLANEOUS) ×3 IMPLANT
TOWEL OR 17X26 10 PK STRL BLUE (TOWEL DISPOSABLE) ×6 IMPLANT
WATER STERILE IRR 500ML POUR (IV SOLUTION) ×3 IMPLANT

## 2020-02-26 NOTE — Transfer of Care (Signed)
Immediate Anesthesia Transfer of Care Note  Patient: [name]  Procedure(s) Performed: Procedure(s) (LRB): DILATATION & CURETTAGE/HYSTEROSCOPY WITH MYOSURE (N/A)  Patient Location: PACU  Anesthesia Type: General  Level of Consciousness: awake, sedated, patient cooperative and responds to stimulation  Airway & Oxygen Therapy: Patient Spontanous Breathing and Patient connected to Chrisman 02 and soft FM   Post-op Assessment: Report given to PACU RN, Post -op Vital signs reviewed and stable and Patient moving all extremities  Post vital signs: Reviewed and stable  Complications: No apparent anesthesia complications

## 2020-02-26 NOTE — Anesthesia Procedure Notes (Signed)
Procedure Name: LMA Insertion Date/Time: 02/26/2020 7:26 AM Performed by: Jessica Priest, CRNA Pre-anesthesia Checklist: Patient identified, Emergency Drugs available, Suction available, Patient being monitored and Timeout performed Patient Re-evaluated:Patient Re-evaluated prior to induction Oxygen Delivery Method: Circle system utilized Preoxygenation: Pre-oxygenation with 100% oxygen Induction Type: IV induction Ventilation: Mask ventilation without difficulty LMA: LMA inserted LMA Size: 4.0 Number of attempts: 1 Airway Equipment and Method: Bite block Placement Confirmation: positive ETCO2,  breath sounds checked- equal and bilateral and CO2 detector Tube secured with: Tape Dental Injury: Teeth and Oropharynx as per pre-operative assessment

## 2020-02-26 NOTE — H&P (Signed)
  History and physical exam unchanged 

## 2020-02-26 NOTE — Anesthesia Postprocedure Evaluation (Signed)
Anesthesia Post Note  Patient: [name]  Procedure(s) Performed: DILATATION & CURETTAGE/HYSTEROSCOPY WITH MYOSURE (N/A Vagina )     Patient location during evaluation: PACU Anesthesia Type: General Level of consciousness: awake and alert Pain management: pain level controlled Vital Signs Assessment: post-procedure vital signs reviewed and stable Respiratory status: spontaneous breathing, nonlabored ventilation, respiratory function stable and patient connected to nasal cannula oxygen Cardiovascular status: blood pressure returned to baseline and stable Postop Assessment: no apparent nausea or vomiting Anesthetic complications: no   No complications documented.  Last Vitals:  Vitals:   02/26/20 0815 02/26/20 0830  BP: 118/72 120/67  Pulse: 84 83  Resp: 14 13  Temp: 36.5 C   SpO2: 94% 96%    Last Pain:  Vitals:   02/26/20 0830  TempSrc:   PainSc: 6                  Romie Tay S

## 2020-02-26 NOTE — Op Note (Signed)
[name], [name] ACCOUNT [id number] DATE OF BIRTH:10-Jun-1960 FACILITY: [facility] LOCATION: [facility] PHYSICIAN:Flora Ratz Lisbeth Ply, MD  OPERATIVE REPORT  DATE OF PROCEDURE:  02/26/2020  PREOPERATIVE DIAGNOSIS:  Endometrial polyp.  POSTOPERATIVE DIAGNOSIS:  Endometrial polyp.  OPERATIVE PROCEDURES:  Cervical dilation, hysteroscopy with resection of endometrial polyp, and endometrial curetting.  SURGEON:  Juluis Mire, MD  ANESTHESIA:  General.  ESTIMATED BLOOD LOSS:  Minimal.  PACKS AND DRAINS:  None.  INTRAOPERATIVE BLOOD PLACED:  None.  COMPLICATIONS:  None.  INDICATIONS:  Dictated in history and physical.  DESCRIPTION OF PROCEDURE:  The patient was taken to the OR and placed in supine position.  After satisfactory level of general anesthesia was obtained, she was placed in the dorsal lithotomy position using Allen stirrups.  Perineum and vagina were  prepped out with Betadine, draped in sterile field.  Speculum was placed in the vaginal vault.  The cervix was secured with a single-tooth tenaculum.  Uterus sounded to approximately 8 cm.  Cervix was serially dilated.  Hysteroscope was introduced.   Visualization revealed apparent septum in the uterus.  She had a small polyp on the posterior wall.  Previous visualization had revealed a larger polyp, but I think we were looking at the septum.  ____ cornua on each side of the septum.  We brought in  the resectoscope.  We resected the polyp on the posterior wall and then just gentle resection of the endometrium.  We then obtained endometrial curettings.  Total deficit was approximately 600 mL.  There were no signs of complications.  Hysteroscope was  then removed along with single tooth tenaculum and speculum.  The patient was taken out of dorsal lithotomy position once alert and extubated and transferred to recovery room in good condition.  Sponge, instrument, and needle count was reported as  correct by  circulating nurse x2.  IN/NUANCE  D:02/26/2020 T:02/26/2020 JOB:[id number]

## 2020-02-26 NOTE — Brief Op Note (Signed)
02/26/2020  8:01 AM  PATIENT:  [name]  59 y.o. female  PRE-OPERATIVE DIAGNOSIS:  EM POLYP, PMB  POST-OPERATIVE DIAGNOSIS:  EM POLYP, PMB  PROCEDURE:  Procedure(s): DILATATION & CURETTAGE/HYSTEROSCOPY WITH MYOSURE (N/A)  SURGEON:  Surgeon(s) and Role:    * Novak Stgermaine, MD - Primary  PHYSICIAN ASSISTANT:   ASSISTANTS: none   ANESTHESIA:   general  EBL:  5 mL   BLOOD ADMINISTERED:none  DRAINS: none   LOCAL MEDICATIONS USED:  NONE  SPECIMEN:  Source of Specimen:  endometrial polyp and currettings  DISPOSITION OF SPECIMEN:  PATHOLOGY  COUNTS:  YES  TOURNIQUET:  * No tourniquets in log *  DICTATION: .Other Dictation: Dictation Number [id number]  PLAN OF CARE: Discharge to home after PACU  PATIENT DISPOSITION:  PACU - hemodynamically stable.   Delay start of Pharmacological VTE agent (>24hrs) due to surgical blood loss or risk of bleeding: not applicable

## 2020-02-26 NOTE — Discharge Instructions (Signed)
DISCHARGE INSTRUCTIONS: HYSTEROSCOPY The following instructions have been prepared to help you care for yourself upon your return home.   May take Ibuprofen after  May take stool softner while taking narcotic pain medication to prevent constipation.  Drink plenty of water.  Personal hygiene: . Use sanitary pads for vaginal drainage, not tampons. . Shower the day after your procedure. . NO tub baths, pools or Jacuzzis for 2-3 weeks. . Wipe front to back after using the bathroom.  Activity and limitations: . Do NOT drive or operate any equipment for 24 hours. The effects of anesthesia are still present and drowsiness may result. . Do NOT rest in bed all day. . Walking is encouraged. . Walk up and down stairs slowly. . You may resume your normal activity in one to two days or as indicated by your physician. Sexual activity: NO intercourse for at least 2 weeks after the procedure, or as indicated by your Doctor.  Diet: Eat a light meal as desired this evening. You may resume your usual diet tomorrow.  Return to Work: You may resume your work activities in one to two days or as indicated by your Doctor.  What to expect after your surgery: Expect to have vaginal bleeding/discharge for 2-3 days and spotting for up to 10 days. It is not unusual to have soreness for up to 1-2 weeks. You may have a slight burning sensation when you urinate for the first day. Mild cramps may continue for a couple of days. You may have a regular period in 2-6 weeks.  Call your doctor for any of the following: . Excessive vaginal bleeding or clotting, saturating and changing one pad every hour. . Inability to urinate 6 hours after discharge from hospital. . Pain not relieved by pain medication. . Fever of 100.4 F or greater. . Unusual vaginal discharge or odor.  Post Anesthesia Home Care Instructions  Activity: Get plenty of rest for the remainder of the day. A responsible individual must stay with  you for 24 hours following the procedure.  For the next 24 hours, DO NOT: -Drive a car -Operate machinery -Drink alcoholic beverages -Take any medication unless instructed by your physician -Make any legal decisions or sign important papers.  Meals: Start with liquid foods such as gelatin or soup. Progress to regular foods as tolerated. Avoid greasy, spicy, heavy foods. If nausea and/or vomiting occur, drink only clear liquids until the nausea and/or vomiting subsides. Call your physician if vomiting continues.  Special Instructions/Symptoms: Your throat may feel dry or sore from the anesthesia or the breathing tube placed in your throat during surgery. If this causes discomfort, gargle with warm salt water. The discomfort should disappear within 24 hours.      

## 2020-02-26 NOTE — Anesthesia Preprocedure Evaluation (Signed)
Anesthesia Evaluation  Patient identified by MRN, date of birth, ID band Patient awake    Reviewed: Allergy & Precautions, H&P , NPO status , Patient's Chart, lab work & pertinent test results  Airway Mallampati: II  TM Distance: >3 FB Neck ROM: Full    Dental no notable dental hx.    Pulmonary sleep apnea ,    Pulmonary exam normal breath sounds clear to auscultation       Cardiovascular negative cardio ROS Normal cardiovascular exam Rhythm:Regular Rate:Normal     Neuro/Psych negative neurological ROS  negative psych ROS   GI/Hepatic Neg liver ROS, GERD  ,  Endo/Other  Morbid obesity  Renal/GU negative Renal ROS  negative genitourinary   Musculoskeletal negative musculoskeletal ROS (+)   Abdominal   Peds negative pediatric ROS (+)  Hematology negative hematology ROS (+)   Anesthesia Other Findings   Reproductive/Obstetrics negative OB ROS                             Anesthesia Physical Anesthesia Plan  ASA: III  Anesthesia Plan: General   Post-op Pain Management:    Induction: Intravenous  PONV Risk Score and Plan: 3 and Ondansetron, Dexamethasone, Midazolam and Treatment may vary due to age or medical condition  Airway Management Planned: LMA  Additional Equipment:   Intra-op Plan:   Post-operative Plan: Extubation in OR  Informed Consent: I have reviewed the patients History and Physical, chart, labs and discussed the procedure including the risks, benefits and alternatives for the proposed anesthesia with the patient or authorized representative who has indicated his/her understanding and acceptance.     Dental advisory given  Plan Discussed with: CRNA and Surgeon  Anesthesia Plan Comments:         Anesthesia Quick Evaluation

## 2020-02-27 ENCOUNTER — Encounter (HOSPITAL_BASED_OUTPATIENT_CLINIC_OR_DEPARTMENT_OTHER): Payer: Self-pay | Admitting: Obstetrics and Gynecology

## 2020-02-27 LAB — SURGICAL PATHOLOGY

## 2020-03-05 ENCOUNTER — Other Ambulatory Visit: Payer: Self-pay | Admitting: Family Medicine

## 2020-03-05 DIAGNOSIS — G2581 Restless legs syndrome: Secondary | ICD-10-CM

## 2020-04-05 IMAGING — CR DG CHEST 2V
2 series · 2 of 2 positions shown · non-contrast
Comparison: 04/15/2018

CLINICAL DATA: Shortness of breath since [REDACTED], received a
breathing treatment in the emergency department earlier today,
history myasthenia gravis, GERD, irritable bowel syndrome, melanoma,
sleep apnea

EXAM:
CHEST - 2 VIEW

[w chest pa]
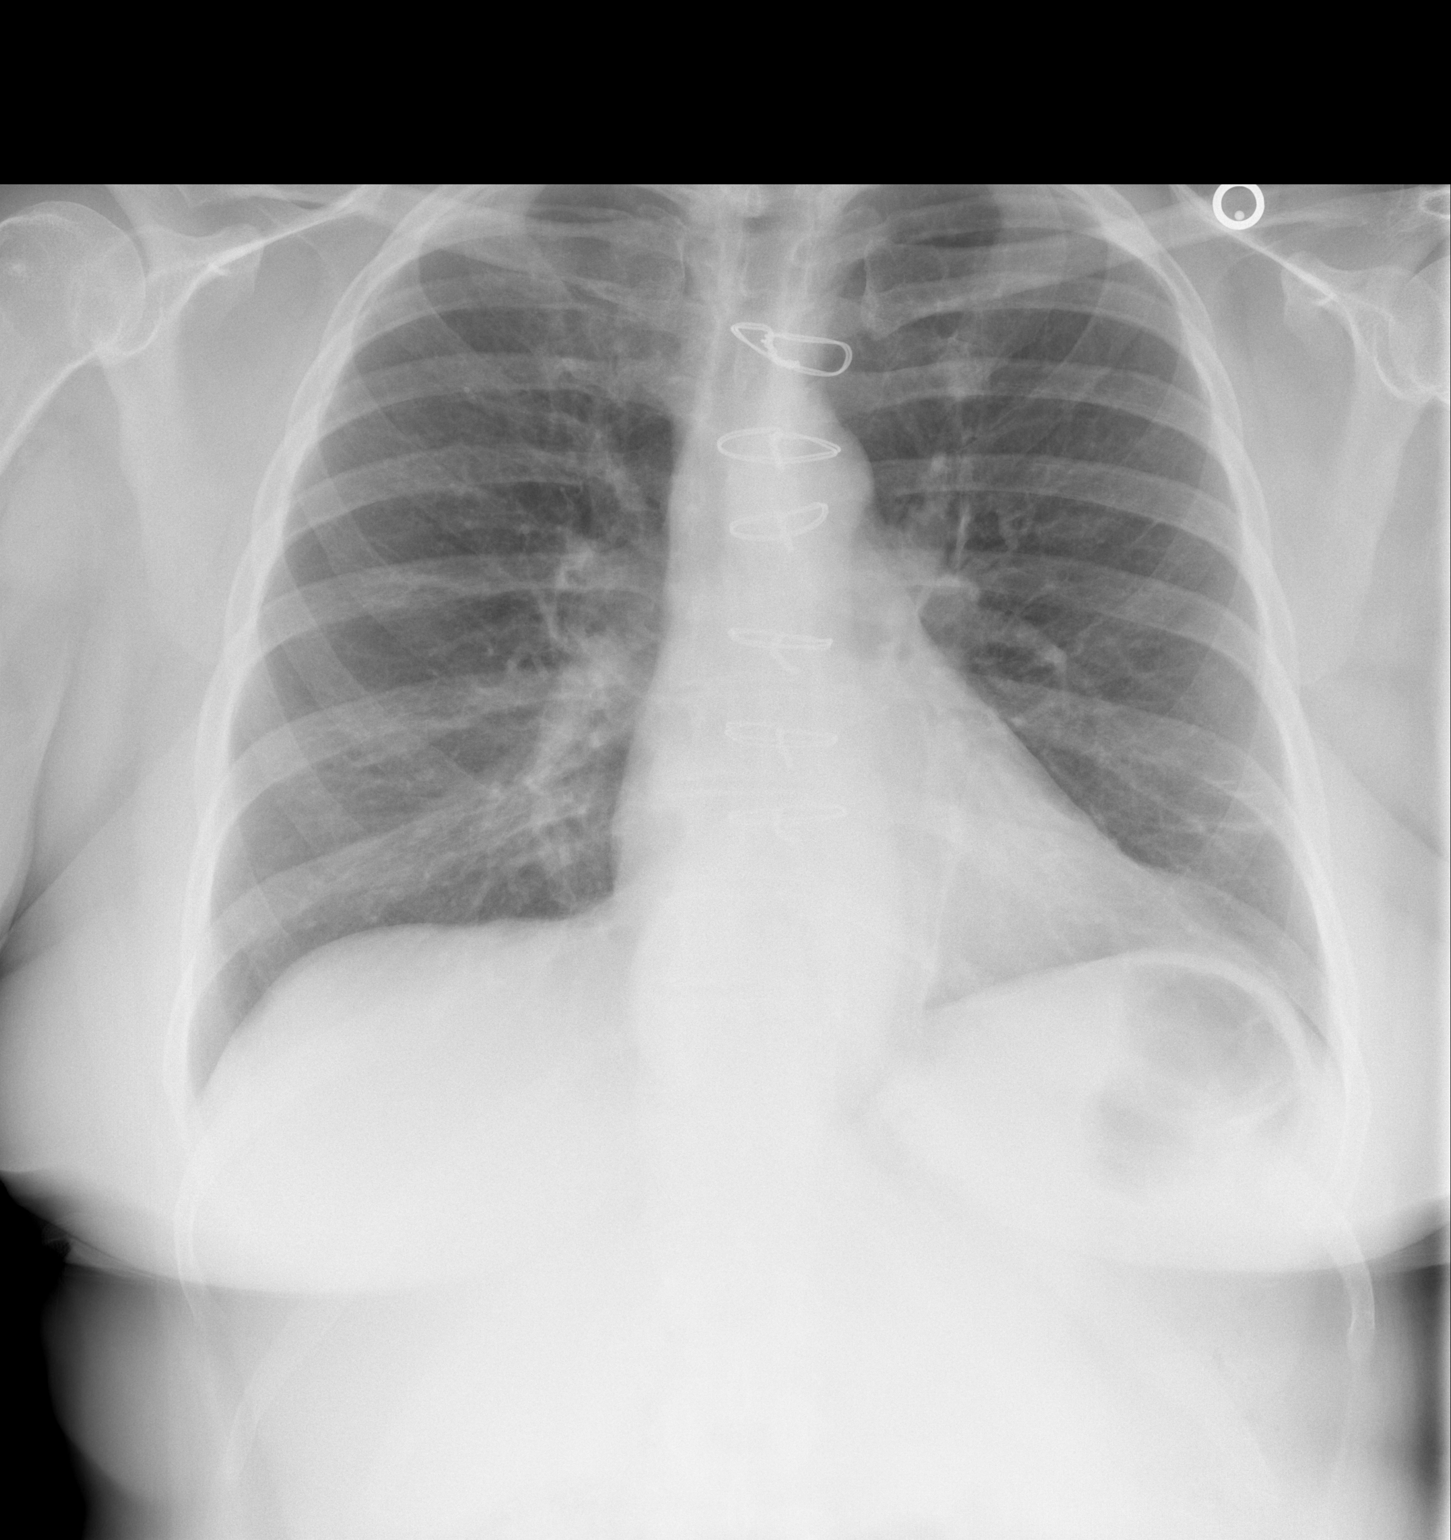

[w chest lat]
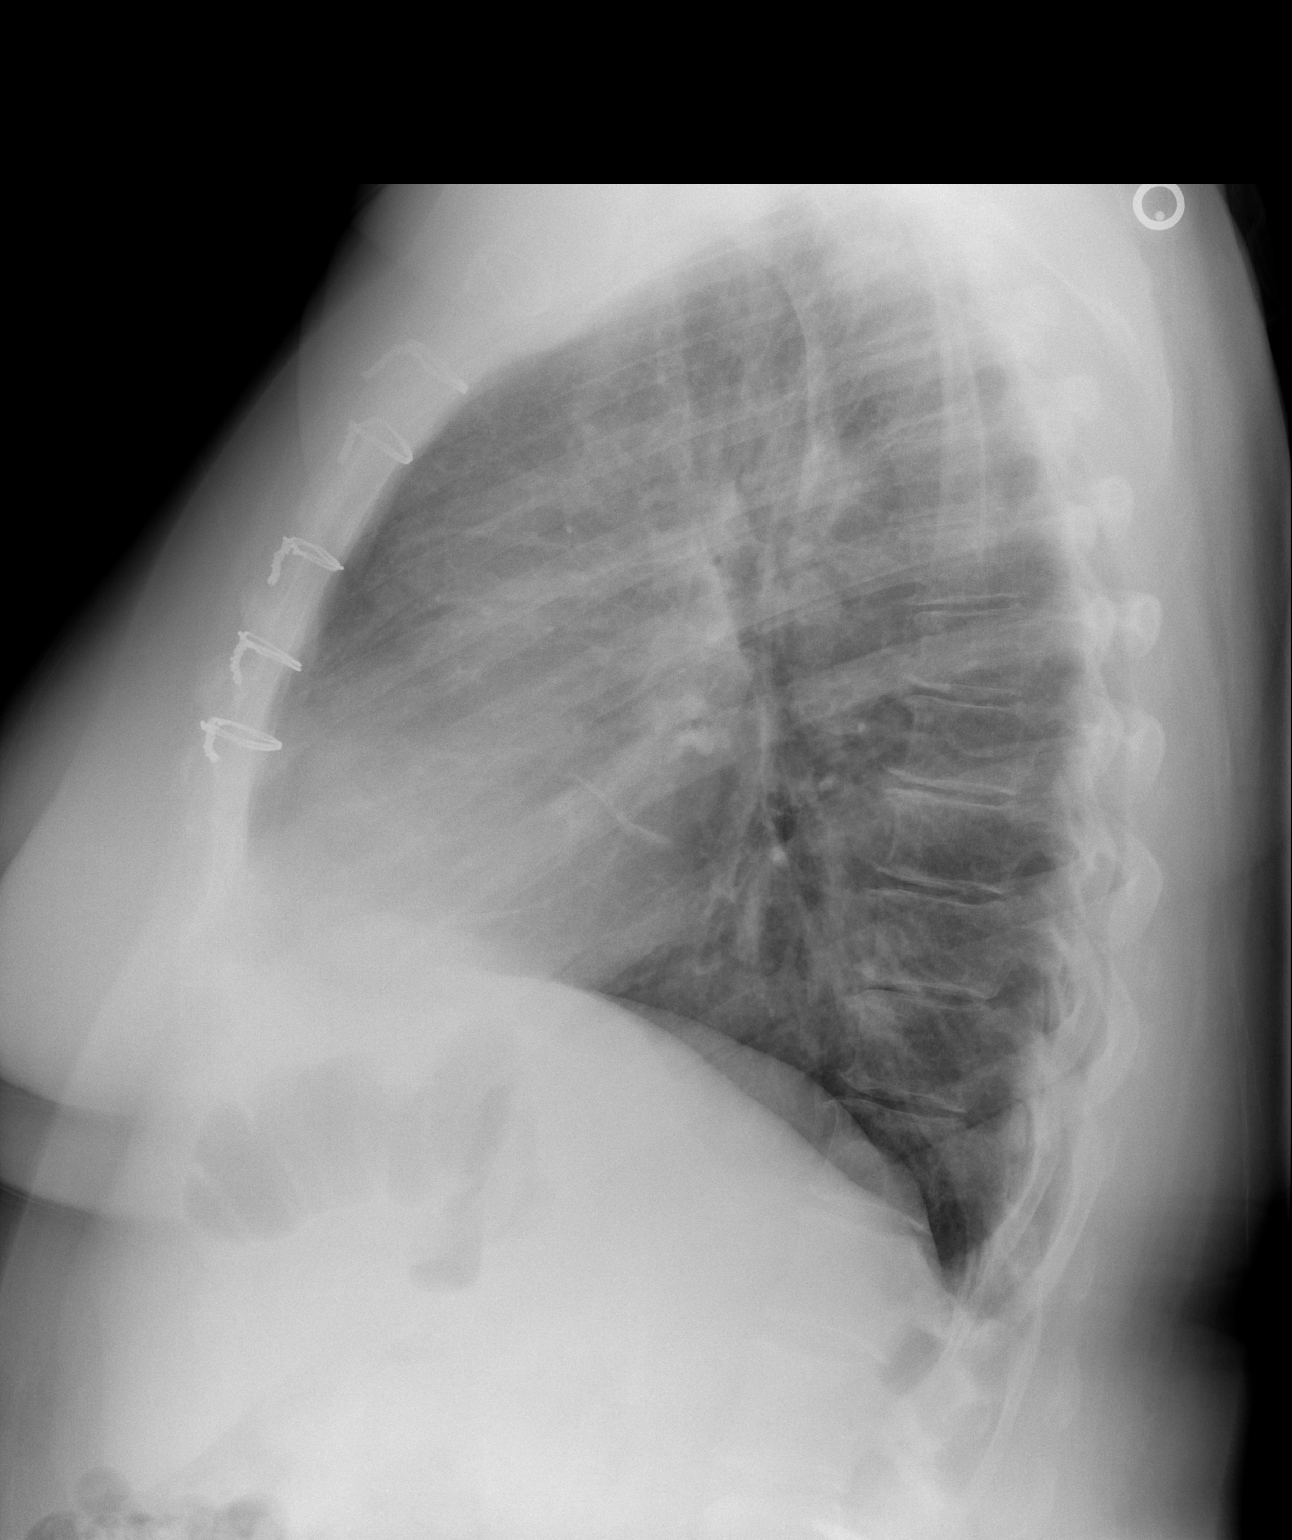

[2 of 2 positions shown; findings below may reference images not displayed]

FINDINGS: Normal heart size post median sternotomy.

Mediastinal contours and pulmonary vascularity normal.

Subsegmental atelectasis LEFT base.

Lungs otherwise clear.

No infiltrate, pleural effusion or pneumothorax.

Bones unremarkable.
IMPRESSION: Subsegmental atelectasis at lingula.

## 2020-08-16 ENCOUNTER — Other Ambulatory Visit: Payer: Self-pay | Admitting: Family Medicine

## 2020-10-14 IMAGING — CT CT ANGIOGRAPHY CHEST
1 of 10 series · 3 of 16 positions shown · IV contrast (omnipaque)
Comparison: None.

CLINICAL DATA: Shortness of breath.

EXAM:
CT ANGIOGRAPHY CHEST WITH CONTRAST
TECHNIQUE: Multidetector CT imaging of the chest was performed using the
standard protocol during bolus administration of intravenous
contrast. Multiplanar CT image reconstructions and MIPs were
obtained to evaluate the vascular anatomy.
CONTRAST:  67mL OMNIPAQUE IOHEXOL 350 MG/ML SOLN

[Series 6: pe thins · axial · 0.77mm/px · z∈[-332,-43]mm · 3 of 290 slices shown]
[im 1/290  lung]
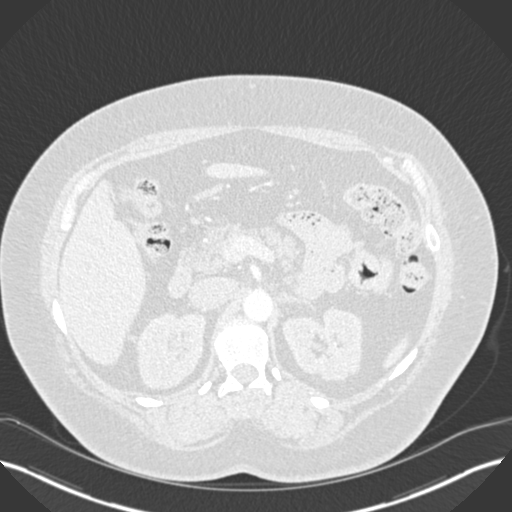
[im 145/290  soft-tissue]
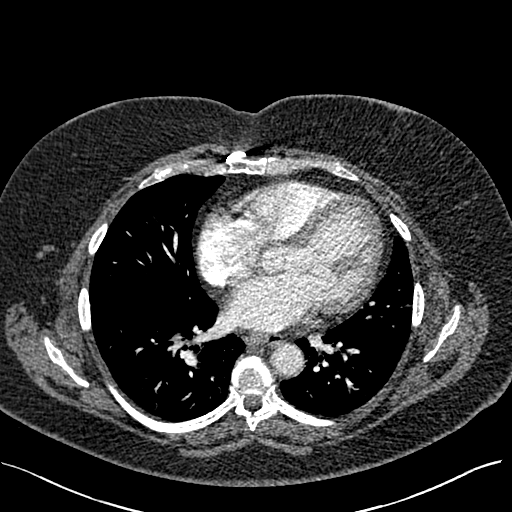
[im 290/290  lung]
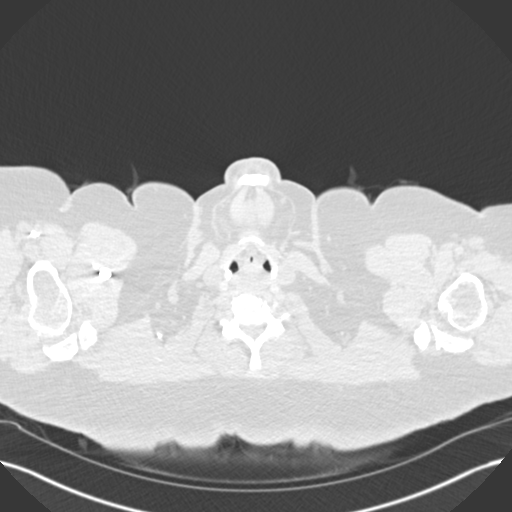

[3 of 16 positions shown; findings below may reference images not displayed]

FINDINGS: Cardiovascular: Satisfactory opacification of the pulmonary arteries
to the segmental level. No evidence of pulmonary embolism. Normal
heart size. No pericardial effusion.

Mediastinum/Nodes: No enlarged mediastinal, hilar, or axillary lymph
nodes. Thyroid gland is normal. Moderate hiatal hernia. Previous
median sternotomy for thymectomy.

Lungs/Pleura: There is a slight mosaic appearance of both lungs
which is nonspecific. It can be seen with pulmonary arterial
hypertension or small airway disease. No pleural effusion or
pneumothorax.

Upper Abdomen: Normal.

Musculoskeletal: No chest wall abnormality. No acute or significant
osseous findings.

Review of the MIP images confirms the above findings.
IMPRESSION: 1. No pulmonary emboli.
2. Mosaic attenuation appearance of both lungs which is nonspecific
but can be seen with small airway disease and pulmonary arterial
hypertension.
3. Moderate hiatal hernia.

## 2021-03-29 ENCOUNTER — Other Ambulatory Visit: Payer: Self-pay | Admitting: Family

## 2021-03-29 ENCOUNTER — Telehealth: Payer: Self-pay | Admitting: *Deleted

## 2021-03-29 NOTE — Telephone Encounter (Signed)
Per scheduling message [name] - called and gave upcoming appointments - requested callback to confirm

## 2021-09-17 ENCOUNTER — Encounter: Payer: Self-pay | Admitting: Family

## 2021-09-24 ENCOUNTER — Other Ambulatory Visit: Payer: Managed Care, Other (non HMO)

## 2021-09-24 ENCOUNTER — Ambulatory Visit: Payer: Managed Care, Other (non HMO) | Admitting: Family

## 2021-09-28 DIAGNOSIS — I495 Sick sinus syndrome: Secondary | ICD-10-CM

## 2021-09-28 DIAGNOSIS — R001 Bradycardia, unspecified: Secondary | ICD-10-CM

## 2021-09-28 HISTORY — DX: Bradycardia, unspecified: R00.1

## 2021-09-28 HISTORY — DX: Sick sinus syndrome: I49.5

## 2021-10-05 DIAGNOSIS — Z950 Presence of cardiac pacemaker: Secondary | ICD-10-CM

## 2021-10-05 HISTORY — DX: Presence of cardiac pacemaker: Z95.0

## 2021-10-05 HISTORY — PX: CARDIAC PACEMAKER PLACEMENT: SHX583

## 2022-03-31 DIAGNOSIS — Z8739 Personal history of other diseases of the musculoskeletal system and connective tissue: Secondary | ICD-10-CM

## 2022-03-31 HISTORY — DX: Personal history of other diseases of the musculoskeletal system and connective tissue: Z87.39

## 2022-04-06 HISTORY — PX: TOTAL KNEE ARTHROPLASTY WITH REVISION COMPONENTS: SHX6198

## 2022-08-05 HISTORY — PX: NASAL SEPTOPLASTY W/ TURBINOPLASTY: SHX2070

## 2022-12-28 NOTE — H&P (Signed)
NAME: [name], [name] MEDICAL RECORD NO: [id number] ACCOUNT NO: [id number] DATE OF BIRTH: [birth year] PHYSICIAN: Juluis Mire, MD  History and Physical   DATE OF ADMISSION: 01/09/2023  The date of her surgery is January 09, 2023.  HISTORY OF PRESENT ILLNESS:  The patient is a 62 year old female that comes in for hysteroscopy D and C.  In relation to the present admission, she was undergoing ultrasound for ovarian assessment.  Endometrial thickening was noted.  Subsequent saline  infusion ultrasound revealed endocervical as well as endometrial polyp.  The patient now presents for surgical management.  ALLERGIES:  SHE IS ALLERGIC TO ADHESIVE TAPE.  MEDICATIONS:  She is on Advair Diskus inhaler, albuterol, azelastine nasal spray, gabapentin and cyanocobalamin tablets.  PAST MEDICAL HISTORY:  She does have a history of myasthenia gravis.  This is being followed by a neurologist as well as an ophthalmologist.  She did have thymus resection for this.  The patient has a history of malignant melanoma.  She has had a previous right salpingo-oophorectomy for cystic teratoma.  She does have evidence of an endometrial polyp.  FAMILY HISTORY:  Noncontributory.  SOCIAL HISTORY:  Reveals no tobacco or alcohol use.  REVIEW OF SYSTEMS:  Noncontributory.  PHYSICAL EXAMINATION: VITAL SIGNS:  The patient is afebrile, stable vital signs. HEENT:  The patient is normocephalic.  Pupils equal, round, react to light and accommodation.  Extraocular movements were intact.  Sclerae and conjunctivae are clear.  Oropharynx clear. NECK:  Without thyromegaly. LUNGS:  Clear to auscultation and percussion. CARDIOVASCULAR:  Regular rhythm and rate without murmurs or gallops. ABDOMEN:  Benign.  No mass, organomegaly, or tenderness. PELVIC: Normal external genitalia.  Vaginal mucosa is clear.  Cervix has an endocervical polyp.  Uterus normal size, shape, and contour.  Adnexa free of masses or  tenderness. EXTREMITIES:  Trace edema. NEUROLOGIC:  Grossly within normal limits.  IMPRESSION: 1.  Endometrial polyp and endocervical polyp. 2.  Myasthenia gravis.  PLAN: The patient will undergo hysteroscopy with resection of polyps.  The risks of surgery have been discussed including the risk of infection.  The risk of hemorrhage and could require transfusion with the risk of AIDS or hepatitis.  Risk of injury to  adjacent organs including bladder, bowel, ureters.  Risk of deep venous thrombosis and pulmonary embolus.  The patient expressed understanding of indications and risks.   [facility] D: 12/28/2022 6:59:47 am T: 12/28/2022 11:08:00 am  JOB: [id number]

## 2022-12-28 NOTE — H&P (Signed)
Patient name  [name], [name] DICTATION# [id number] VWU#[id number]  Richardean Chimera, MD 12/28/2022 7:00 AM

## 2022-12-31 ENCOUNTER — Encounter: Payer: Self-pay | Admitting: Family

## 2023-01-04 NOTE — Progress Notes (Signed)
On 01-03-2023  LVM w/ phone number on [facility] EP remote monitoring [employer] note from care every where last device check , requested return call from information where to send device orders for patients pacemaker. On 01-04-2023  Have not received call back from above.  So called patients cardiology office , [name] office, [facility], phone number obtained from last office note in care everywhere,  office staff person transferred me to nurse line, had to leave message with requesting device clinic name/ contact person/ number/ fax number for patient to send device orders to.  Requested return call back. On 01/05/2023 @ 1000 called and lvm for nurse line again @ pt's cardiology office, same as above since no call back yet. On 01/05/2023 @ 1215 received message from nurse from Dr Allena Katz office was given EP Dr Sherryll Burger office number # [phone] ext [phone] and remote monitoring number # [phone] ext [phone].  I have left message for the remote monitoring . On 11/07 @ 1515 called and spoke w/ [employer] [phone], informed of pt surgery on 11/11 start of surgery pt information and my name and number as contact for rep. @ 1520  called and spoke w/ office staff, Victorino Dike w/ EP office for Dr Sherryll Burger she understood what I needed and gave fax number to device clinic nurse. Faxed device orders w/ confirmation,

## 2023-01-05 ENCOUNTER — Encounter (HOSPITAL_BASED_OUTPATIENT_CLINIC_OR_DEPARTMENT_OTHER): Payer: Self-pay | Admitting: Obstetrics and Gynecology

## 2023-01-05 ENCOUNTER — Encounter: Payer: Self-pay | Admitting: Family

## 2023-01-05 NOTE — Progress Notes (Addendum)
Spoke w/ via phone for pre-op interview--- pt Lab needs dos----   [employer], ekg      Lab results------ no COVID test -----patient states asymptomatic no test needed Arrive at ------- 0530 on 01-09-2023 NPO after MN NO Solid Food.  Clear liquids from MN until--- 0430 Med rec completed Medications to take morning of surgery ----- cardizem, protonix Diabetic medication ----- n/a Patient instructed no nail polish to be worn day of surgery Patient instructed to bring photo id and insurance card day of surgery Patient aware to have Driver (ride ) / caregiver    for 24 hours after surgery - husband, [name] Patient Special Instructions ----- asked to bring both inhaler's dos Pre-Op special Instructions -----  pacemaker device orders pending,  still working on where to fax orders Device orders have been faxed @ 1528 11/07 (see progress note by Angelique Blonder 11/06 & 11/07) Patient verbalized understanding of instructions that were given at this phone interview. Patient denies chest pain, sob, fever, cough at the interview.    Anesthesia Review:  on 01-03-2023 reviewed w/ Dr Ace Gins [facility] prior to speaking w/ pt if candidate for [name],  stated ok to proceed @ [facility] and pt did not need cardiac clearance. 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    (All notes/ results in care everywhere) PCP:  [name] NP Cardiologist :  [name] [facility] 10-20-2022) Neurologist:  [name] 12-20-2022) Pulmonology:  [name] 06-14-2022) Hematology:  [name]  [facility] 12-16-2022) ID:  [name] ([facility] 06-09-2022) Pacer check last one in care everywhere:  11-29-2022 Chest x-ray : 06-18-2022 EKG : 10-13-2022 Echo :  03-21-2022 Stress test: none Zio monitor:  08-24-2021 Cardiac Cath : none Activity level: see above Sleep Study/ CPAP :  yes/ no Blood Thinner/ Instructions [name] Dose:  no ASA / Instructions/ Last Dose : no

## 2023-01-06 NOTE — Progress Notes (Addendum)
Medtronic rep called and left voicemail message what procedure is patient having done? Called ivan fairley at [phone] and left voicemail message patient procedure is d & c, hysteroscopy

## 2023-01-07 NOTE — Anesthesia Preprocedure Evaluation (Signed)
Anesthesia Evaluation  Patient identified by MRN, date of birth, ID band Patient awake    Reviewed: Allergy & Precautions, NPO status , Patient's Chart, lab work & pertinent test results  History of Anesthesia Complications (+) PONV and history of anesthetic complications  Airway Mallampati: III  TM Distance: >3 FB Neck ROM: Full    Dental  (+) Dental Advisory Given   Pulmonary neg shortness of breath, asthma (no recent flares, used inhaler this morning for preparation for surgery) , sleep apnea (does not use CPAP due to not having a mask that fits) , neg COPD, neg recent URI   Pulmonary exam normal breath sounds clear to auscultation       Cardiovascular (-) hypertension(-) angina (-) Past MI, (-) Cardiac Stents and (-) CABG + dysrhythmias (SSS) + pacemaker (Medtronic, placed for SSS, paced 1% of the time per the patient)  Rhythm:Regular Rate:Normal  HLD  TTE 04/18/2018: IMPRESSIONS    1. The left ventricle has normal systolic function with an ejection  fraction of 60-65%. The cavity size was normal. There is mildly increased  left ventricular wall thickness. Left ventricular diastolic parameters  were normal.   2. The right ventricle has normal systolic function. The cavity was  normal. There is no increase in right ventricular wall thickness.   3. Left atrial size was moderately dilated.   4. The mitral valve is degenerative. Mild thickening of the mitral valve  leaflet.   5. The tricuspid valve is normal in structure.   6. The aortic valve is tricuspid Mild thickening of the aortic valve Mild  calcification of the aortic valve.   7. The pulmonic valve was normal in structure.   8. The inferior vena cava was normal in size with <50% respiratory  variability.     Neuro/Psych neg Seizures PSYCHIATRIC DISORDERS  Depression     Neuromuscular disease (myasthenia gravis (not currently on medication but waiting to start monthly  IVIG treatments), lumbar spinal stenosis, cervical radiculopathy)    GI/Hepatic hiatal hernia,GERD  Medicated,,Fatty liver   Endo/Other  negative endocrine ROS    Renal/GU negative Renal ROS     Musculoskeletal  (+) Arthritis , Osteoarthritis,    Abdominal  (+) + obese  Peds  Hematology  (+) Blood dyscrasia, anemia     Anesthesia Other Findings   Reproductive/Obstetrics                             Anesthesia Physical Anesthesia Plan  ASA: 3  Anesthesia Plan: General   Post-op Pain Management: Tylenol PO (pre-op)*   Induction: Intravenous  PONV Risk Score and Plan: 3 and Ondansetron, Dexamethasone and Treatment may vary due to age or medical condition  Airway Management Planned: LMA  Additional Equipment:   Intra-op Plan:   Post-operative Plan: Extubation in OR  Informed Consent: I have reviewed the patients History and Physical, chart, labs and discussed the procedure including the risks, benefits and alternatives for the proposed anesthesia with the patient or authorized representative who has indicated his/her understanding and acceptance.     Dental advisory given  Plan Discussed with: Anesthesiologist and CRNA  Anesthesia Plan Comments: (Risks of general anesthesia discussed including, but not limited to, sore throat, hoarse voice, chipped/damaged teeth, injury to vocal cords, nausea and vomiting, allergic reactions, lung infection, heart attack, stroke, and death. All questions answered. )       Anesthesia Quick Evaluation

## 2023-01-09 ENCOUNTER — Encounter (HOSPITAL_BASED_OUTPATIENT_CLINIC_OR_DEPARTMENT_OTHER): Payer: Self-pay | Admitting: Obstetrics and Gynecology

## 2023-01-09 ENCOUNTER — Ambulatory Visit (HOSPITAL_BASED_OUTPATIENT_CLINIC_OR_DEPARTMENT_OTHER): Payer: 59 | Admitting: Anesthesiology

## 2023-01-09 ENCOUNTER — Ambulatory Visit (HOSPITAL_BASED_OUTPATIENT_CLINIC_OR_DEPARTMENT_OTHER)
Admission: RE | Admit: 2023-01-09 | Discharge: 2023-01-09 | Disposition: A | Discharge status: Home / Self Care | Payer: 59 | Attending: Obstetrics and Gynecology | Admitting: Obstetrics and Gynecology

## 2023-01-09 ENCOUNTER — Other Ambulatory Visit: Payer: Self-pay

## 2023-01-09 ENCOUNTER — Encounter (HOSPITAL_BASED_OUTPATIENT_CLINIC_OR_DEPARTMENT_OTHER)
Admission: RE | Disposition: A | Discharge status: Home / Self Care | Payer: Self-pay | Source: Home / Self Care | Attending: Obstetrics and Gynecology

## 2023-01-09 DIAGNOSIS — N840 Polyp of corpus uteri: Secondary | ICD-10-CM | POA: Insufficient documentation

## 2023-01-09 DIAGNOSIS — Z950 Presence of cardiac pacemaker: Secondary | ICD-10-CM | POA: Insufficient documentation

## 2023-01-09 DIAGNOSIS — I495 Sick sinus syndrome: Secondary | ICD-10-CM | POA: Diagnosis not present

## 2023-01-09 DIAGNOSIS — Z85820 Personal history of malignant melanoma of skin: Secondary | ICD-10-CM | POA: Diagnosis not present

## 2023-01-09 DIAGNOSIS — J45909 Unspecified asthma, uncomplicated: Secondary | ICD-10-CM | POA: Diagnosis not present

## 2023-01-09 DIAGNOSIS — Z01818 Encounter for other preprocedural examination: Secondary | ICD-10-CM

## 2023-01-09 DIAGNOSIS — G7000 Myasthenia gravis without (acute) exacerbation: Secondary | ICD-10-CM | POA: Diagnosis not present

## 2023-01-09 DIAGNOSIS — K219 Gastro-esophageal reflux disease without esophagitis: Secondary | ICD-10-CM | POA: Insufficient documentation

## 2023-01-09 DIAGNOSIS — J4520 Mild intermittent asthma, uncomplicated: Secondary | ICD-10-CM

## 2023-01-09 DIAGNOSIS — N841 Polyp of cervix uteri: Secondary | ICD-10-CM | POA: Diagnosis present

## 2023-01-09 DIAGNOSIS — Z7951 Long term (current) use of inhaled steroids: Secondary | ICD-10-CM | POA: Insufficient documentation

## 2023-01-09 DIAGNOSIS — I700 Atherosclerosis of aorta: Secondary | ICD-10-CM | POA: Diagnosis not present

## 2023-01-09 HISTORY — DX: Spinal stenosis, lumbar region without neurogenic claudication: M48.061

## 2023-01-09 HISTORY — DX: Diplopia: H53.2

## 2023-01-09 HISTORY — DX: Mixed hyperlipidemia: E78.2

## 2023-01-09 HISTORY — DX: Abnormal findings on diagnostic imaging of other specified body structures: R93.89

## 2023-01-09 HISTORY — DX: Presence of spectacles and contact lenses: Z97.3

## 2023-01-09 HISTORY — PX: DILATATION & CURETTAGE/HYSTEROSCOPY WITH MYOSURE: SHX6511

## 2023-01-09 HISTORY — DX: Other specified postprocedural states: R11.2

## 2023-01-09 HISTORY — DX: Deficiency of other specified B group vitamins: E53.8

## 2023-01-09 HISTORY — DX: Nausea with vomiting, unspecified: R11.2

## 2023-01-09 HISTORY — DX: Severe persistent asthma, uncomplicated: J45.50

## 2023-01-09 LAB — POCT I-STAT, CHEM 8
BUN: 17 mg/dL (ref 8–23)
Calcium, Ion: 1.17 mmol/L (ref 1.15–1.40)
Chloride: 101 mmol/L (ref 98–111)
Creatinine, Ser: 0.7 mg/dL (ref 0.44–1.00)
Glucose, Bld: 109 mg/dL — ABNORMAL HIGH (ref 70–99)
HCT: 31 % — ABNORMAL LOW (ref 36.0–46.0)
Hemoglobin: 10.5 g/dL — ABNORMAL LOW (ref 12.0–15.0)
Potassium: 3.8 mmol/L (ref 3.5–5.1)
Sodium: 141 mmol/L (ref 135–145)
TCO2: 26 mmol/L (ref 22–32)

## 2023-01-09 LAB — TYPE AND SCREEN
ABO/RH(D): O NEG
Antibody Screen: NEGATIVE

## 2023-01-09 SURGERY — DILATATION & CURETTAGE/HYSTEROSCOPY WITH MYOSURE
Anesthesia: General | Site: Uterus

## 2023-01-09 MED ORDER — ACETAMINOPHEN 500 MG PO TABS
ORAL_TABLET | ORAL | Status: AC
  Filled 2023-01-09: qty 2

## 2023-01-09 MED ORDER — LIDOCAINE HCL 1 % IJ SOLN: INTRAMUSCULAR | Status: DC | PRN

## 2023-01-09 MED ORDER — STERILE WATER FOR IRRIGATION IR SOLN
Status: DC | PRN
Start: 2023-01-09 — End: 2023-01-09
  Administered 2023-01-09: 500 mL

## 2023-01-09 MED ORDER — PROPOFOL 10 MG/ML IV BOLUS
INTRAVENOUS | Status: AC
  Filled 2023-01-09: qty 20

## 2023-01-09 MED ORDER — OXYCODONE HCL 5 MG/5ML PO SOLN: 5.0000 mg | Freq: Once | ORAL | Status: DC | PRN

## 2023-01-09 MED ORDER — LIDOCAINE HCL (PF) 2 % IJ SOLN
INTRAMUSCULAR | Status: AC
  Filled 2023-01-09: qty 5

## 2023-01-09 MED ORDER — ONDANSETRON HCL 4 MG/2ML IJ SOLN
INTRAMUSCULAR | Status: AC
  Filled 2023-01-09: qty 2

## 2023-01-09 MED ORDER — DEXAMETHASONE SODIUM PHOSPHATE 10 MG/ML IJ SOLN
INTRAMUSCULAR | Status: AC
  Filled 2023-01-09: qty 1

## 2023-01-09 MED ORDER — CIPROFLOXACIN IN D5W 400 MG/200ML IV SOLN
INTRAVENOUS | Status: AC
  Filled 2023-01-09: qty 200

## 2023-01-09 MED ORDER — PHENYLEPHRINE 80 MCG/ML (10ML) SYRINGE FOR IV PUSH (FOR BLOOD PRESSURE SUPPORT)
PREFILLED_SYRINGE | INTRAVENOUS | Status: AC
  Filled 2023-01-09: qty 10

## 2023-01-09 MED ORDER — PROPOFOL 10 MG/ML IV BOLUS
INTRAVENOUS | Status: DC | PRN
  Administered 2023-01-09: 60 mg via INTRAVENOUS
  Administered 2023-01-09: 40 mg via INTRAVENOUS
  Administered 2023-01-09: 200 mg via INTRAVENOUS
  Administered 2023-01-09: 60 mg via INTRAVENOUS

## 2023-01-09 MED ORDER — OXYCODONE HCL 5 MG PO TABS: 5.0000 mg | ORAL_TABLET | Freq: Once | ORAL | Status: DC | PRN

## 2023-01-09 MED ORDER — CIPROFLOXACIN IN D5W 400 MG/200ML IV SOLN
400.0000 mg | INTRAVENOUS | Status: AC
  Administered 2023-01-09: 400 mg via INTRAVENOUS

## 2023-01-09 MED ORDER — FENTANYL CITRATE (PF) 100 MCG/2ML IJ SOLN
INTRAMUSCULAR | Status: AC
  Filled 2023-01-09: qty 2

## 2023-01-09 MED ORDER — SODIUM CHLORIDE 0.9 % IV SOLN: INTRAVENOUS | Status: DC

## 2023-01-09 MED ORDER — ARTIFICIAL TEARS OPHTHALMIC OINT
TOPICAL_OINTMENT | OPHTHALMIC | Status: AC
  Filled 2023-01-09: qty 3.5

## 2023-01-09 MED ORDER — MIDAZOLAM HCL 2 MG/2ML IJ SOLN
INTRAMUSCULAR | Status: AC
Start: 2023-01-09 — End: ?
  Filled 2023-01-09: qty 2

## 2023-01-09 MED ORDER — SODIUM CHLORIDE 0.9 % IR SOLN
Status: DC | PRN
  Administered 2023-01-09: 3000 mL

## 2023-01-09 MED ORDER — POVIDONE-IODINE 10 % EX SWAB: 2.0000 | Freq: Once | CUTANEOUS | Status: DC

## 2023-01-09 MED ORDER — DEXAMETHASONE SODIUM PHOSPHATE 10 MG/ML IJ SOLN
INTRAMUSCULAR | Status: DC | PRN
  Administered 2023-01-09: 5 mg via INTRAVENOUS

## 2023-01-09 MED ORDER — FENTANYL CITRATE (PF) 100 MCG/2ML IJ SOLN
INTRAMUSCULAR | Status: DC | PRN
  Administered 2023-01-09 (×2): 50 ug via INTRAVENOUS

## 2023-01-09 MED ORDER — METRONIDAZOLE 500 MG/100ML IV SOLN
INTRAVENOUS | Status: AC
  Filled 2023-01-09: qty 100

## 2023-01-09 MED ORDER — MIDAZOLAM HCL 5 MG/5ML IJ SOLN
INTRAMUSCULAR | Status: DC | PRN
  Administered 2023-01-09: 2 mg via INTRAVENOUS

## 2023-01-09 MED ORDER — ONDANSETRON HCL 4 MG/2ML IJ SOLN
INTRAMUSCULAR | Status: DC | PRN
  Administered 2023-01-09: 4 mg via INTRAVENOUS

## 2023-01-09 MED ORDER — LIDOCAINE 2% (20 MG/ML) 5 ML SYRINGE
INTRAMUSCULAR | Status: DC | PRN
  Administered 2023-01-09: 100 mg via INTRAVENOUS

## 2023-01-09 MED ORDER — METRONIDAZOLE 500 MG/100ML IV SOLN
500.0000 mg | INTRAVENOUS | Status: AC
  Administered 2023-01-09: 500 mg via INTRAVENOUS

## 2023-01-09 MED ORDER — FENTANYL CITRATE (PF) 100 MCG/2ML IJ SOLN: 25.0000 ug | INTRAMUSCULAR | Status: DC | PRN

## 2023-01-09 MED ORDER — LACTATED RINGERS IV SOLN
INTRAVENOUS | Status: DC
Start: 2023-01-09 — End: 2023-01-09

## 2023-01-09 MED ORDER — AMISULPRIDE (ANTIEMETIC) 5 MG/2ML IV SOLN: 10.0000 mg | Freq: Once | INTRAVENOUS | Status: DC | PRN

## 2023-01-09 MED ORDER — ACETAMINOPHEN 500 MG PO TABS
1000.0000 mg | ORAL_TABLET | Freq: Once | ORAL | Status: AC
  Administered 2023-01-09: 1000 mg via ORAL

## 2023-01-09 MED ORDER — PHENYLEPHRINE 80 MCG/ML (10ML) SYRINGE FOR IV PUSH (FOR BLOOD PRESSURE SUPPORT)
PREFILLED_SYRINGE | INTRAVENOUS | Status: DC | PRN
  Administered 2023-01-09: 240 ug via INTRAVENOUS
  Administered 2023-01-09: 160 ug via INTRAVENOUS

## 2023-01-09 SURGICAL SUPPLY — 25 items
CATH ROBINSON RED A/P 16FR (CATHETERS) ×2 IMPLANT
DEVICE MYOSURE LITE (MISCELLANEOUS) IMPLANT
DEVICE MYOSURE REACH (MISCELLANEOUS) IMPLANT
DILATOR CANAL MILEX (MISCELLANEOUS) IMPLANT
DRSG TELFA 3X8 NADH STRL (GAUZE/BANDAGES/DRESSINGS) ×2 IMPLANT
GAUZE 4X4 16PLY ~~LOC~~+RFID DBL (SPONGE) ×4 IMPLANT
GLOVE BIO SURGEON STRL SZ7 (GLOVE) ×4 IMPLANT
GLOVE SURG SS PI 7.0 STRL IVOR (GLOVE) IMPLANT
GOWN STRL REUS W/TWL LRG LVL3 (GOWN DISPOSABLE) IMPLANT
GOWN STRL REUS W/TWL XL LVL3 (GOWN DISPOSABLE) ×2 IMPLANT
IV NS IRRIG 3000ML ARTHROMATIC (IV SOLUTION) ×2 IMPLANT
KIT PROCEDURE FLUENT (KITS) ×2 IMPLANT
KIT TURNOVER CYSTO (KITS) ×2 IMPLANT
LOOP CUTTING BIPOLAR 21FR (ELECTRODE) IMPLANT
MYOSURE XL FIBROID (MISCELLANEOUS)
NDL SPNL 18GX3.5 QUINCKE PK (NEEDLE) ×2 IMPLANT
NEEDLE SPNL 18GX3.5 QUINCKE PK (NEEDLE) IMPLANT
PACK VAGINAL MINOR WOMEN LF (CUSTOM PROCEDURE TRAY) ×2 IMPLANT
PAD OB MATERNITY 4.3X12.25 (PERSONAL CARE ITEMS) ×2 IMPLANT
PAD PREP 24X48 CUFFED NSTRL (MISCELLANEOUS) ×2 IMPLANT
SEAL ROD LENS SCOPE MYOSURE (ABLATOR) ×2 IMPLANT
SLEEVE SCD COMPRESS KNEE MED (STOCKING) ×2 IMPLANT
SYSTEM TISS REMOVAL MYOSURE XL (MISCELLANEOUS) IMPLANT
TOWEL OR 17X24 6PK STRL BLUE (TOWEL DISPOSABLE) ×4 IMPLANT
WATER STERILE IRR 500ML POUR (IV SOLUTION) ×2 IMPLANT

## 2023-01-09 NOTE — Op Note (Signed)
NAMEANALISE, DOMIN MEDICAL RECORD NO: [id number] ACCOUNT NO: [id number] DATE OF BIRTH: 28-Jun-1960 FACILITY: [facility] LOCATION: [facility] PHYSICIAN: Juluis Mire, MD  Operative Report   DATE OF PROCEDURE: 01/09/2023  PREOPERATIVE DIAGNOSIS:  Endometrial and endocervical polyps.  POSTOPERATIVE DIAGNOSIS:  Endometrial and endocervical polyps.  OPERATIVE PROCEDURE:  Hysteroscopy with resection of endometrial polyp.  Removal of endocervical polyp.  SURGEON:  Juluis Mire, MD  ANESTHESIA:  General  ESTIMATED BLOOD LOSS:  Minimal  PACKS AND DRAINS:  None  INTRAOPERATIVE BLOOD REPLACEMENT:  None  COMPLICATIONS:  None  INDICATIONS:  Dictated in history and physical.  PROCEDURE DETAILS:  As follows, the patient was taken in the OR and placed in the supine position.  After a satisfactory level of general anesthesia was obtained, she was placed in the dorsal lithotomy position using the Allen stirrups.  Perineum and  vagina were prepped out with Betadine and draped in a sterile field.  Speculum was placed in the vaginal vault.  Cervix was grasped with single-tooth tenaculum.  She did have an endocervical polyp noted.  We first went inside.  The uterus sounded to 9  cm.  Cervix was sterilely dilated.  Hysteroscope was introduced.  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  This was resected using the MyoSure and sent for pathological review.  In the  procedure, the polyp had been completely resected, no other intrauterine pathology was noted.  Hysteroscope was then removed.  Endometrial curettings were then obtained.  Attention was now turned to the endocervical polyp.  It was grasped with a ring forceps and excised and sent for pathologic review.  We had good hemostasis.  No active bleeding was noted.   The patient was taken out dorsal supine position, was alert and extubated, transferred to the recovery room in good  condition.  Sponge, instrument, and needle counts reports corrected by circulating nurse x2.    VAI D: 01/09/2023 7:51:17 am T: 01/09/2023 8:06:00 am  JOB: [id number]/ [id number]

## 2023-01-09 NOTE — Anesthesia Postprocedure Evaluation (Signed)
Anesthesia Post Note  Patient: [name]  Procedure(s) Performed: DILATATION & CURETTAGE/HYSTEROSCOPY WITH MYOSURE (Uterus)     Patient location during evaluation: PACU Anesthesia Type: General Level of consciousness: awake Pain management: pain level controlled Vital Signs Assessment: post-procedure vital signs reviewed and stable Respiratory status: spontaneous breathing, nonlabored ventilation and respiratory function stable Cardiovascular status: blood pressure returned to baseline and stable Postop Assessment: no apparent nausea or vomiting Anesthetic complications: no   No notable events documented.  Last Vitals:  Vitals:   01/09/23 0853 01/09/23 0915  BP:  (!) 153/100  Pulse:  89  Resp:  18  Temp: 36.7 C   SpO2:  94%    Last Pain:  Vitals:   01/09/23 0915  TempSrc:   PainSc: 0-No pain                 Linton Rump

## 2023-01-09 NOTE — H&P (Signed)
  History and physical exam unchanged 

## 2023-01-09 NOTE — Brief Op Note (Signed)
01/09/2023  7:51 AM  PATIENT:  [name]  62 y.o. female  PRE-OPERATIVE DIAGNOSIS:  THICKENED ENDOMETRIUM  POST-OPERATIVE DIAGNOSIS:  THICKENED ENDOMETRIUM  PROCEDURE:  Procedure(s): DILATATION & CURETTAGE/HYSTEROSCOPY WITH MYOSURE (N/A)  SURGEON:  Surgeons and Role:    * Richardean Chimera, MD - Primary  PHYSICIAN ASSISTANT:   ASSISTANTS: none   ANESTHESIA:   general  EBL:  minima;   BLOOD ADMINISTERED:none  DRAINS: none   LOCAL MEDICATIONS USED:  NONE  SPECIMEN:  Source of Specimen:  endometrial polyp and endocervical polyp  DISPOSITION OF SPECIMEN:  PATHOLOGY  COUNTS:  YES  TOURNIQUET:  * No tourniquets in log *  DICTATION: .Other Dictation: Dictation Number [id number]  PLAN OF CARE: Discharge to home after PACU  PATIENT DISPOSITION:  PACU - hemodynamically stable.   Delay start of Pharmacological VTE agent (>24hrs) due to surgical blood loss or risk of bleeding: not applicable

## 2023-01-09 NOTE — Discharge Instructions (Signed)

## 2023-01-09 NOTE — Transfer of Care (Signed)
Immediate Anesthesia Transfer of Care Note  Patient: [name]  Procedure(s) Performed: DILATATION & CURETTAGE/HYSTEROSCOPY WITH MYOSURE (Uterus)  Patient Location: PACU  Anesthesia Type:General  Level of Consciousness: awake, alert , oriented, and patient cooperative  Airway & Oxygen Therapy: Patient Spontanous Breathing and Patient connected to face mask oxygen  Post-op Assessment: Report given to RN and Post -op Vital signs reviewed and stable  Post vital signs: Reviewed and stable  Last Vitals:  Vitals Value Taken Time  BP 86/77 01/09/23 0800  Temp    Pulse 89 01/09/23 0805  Resp 18 01/09/23 0805  SpO2 96 % 01/09/23 0805  Vitals shown include unfiled device data.  Last Pain:  Vitals:   01/09/23 0650  TempSrc: Oral  PainSc: 4       Patients Stated Pain Goal: 4 (01/09/23 0650)  Complications: No notable events documented.

## 2023-01-09 NOTE — Anesthesia Procedure Notes (Signed)
Procedure Name: LMA Insertion Date/Time: 01/09/2023 7:28 AM  Performed by: Bishop Limbo, CRNAPre-anesthesia Checklist: Patient identified, Emergency Drugs available, Suction available and Patient being monitored Patient Re-evaluated:Patient Re-evaluated prior to induction Oxygen Delivery Method: Circle System Utilized Preoxygenation: Pre-oxygenation with 100% oxygen Induction Type: IV induction Ventilation: Mask ventilation without difficulty LMA: LMA inserted LMA Size: 4.0 Number of attempts: 1 Placement Confirmation: positive ETCO2 Tube secured with: Tape Dental Injury: Teeth and Oropharynx as per pre-operative assessment

## 2023-01-10 LAB — SURGICAL PATHOLOGY

## 2023-01-11 ENCOUNTER — Encounter (HOSPITAL_BASED_OUTPATIENT_CLINIC_OR_DEPARTMENT_OTHER): Payer: Self-pay | Admitting: Obstetrics and Gynecology

## 2023-04-28 ENCOUNTER — Encounter: Payer: Self-pay | Admitting: Family

## 2024-01-17 ENCOUNTER — Ambulatory Visit: Payer: Self-pay | Admitting: Family Medicine
